# Patient Record
Sex: Female | Born: 1940 | Hispanic: No
Health system: Southern US, Academic
[De-identification: ages and names within clinical notes are randomized; demographics above are authoritative.]

## PROBLEM LIST (undated history)

## (undated) ENCOUNTER — Ambulatory Visit: Payer: MEDICARE

## (undated) ENCOUNTER — Telehealth: Attending: Dermatology | Primary: Dermatology

## (undated) DIAGNOSIS — Z87442 Personal history of urinary calculi: Secondary | ICD-10-CM

## (undated) DIAGNOSIS — I10 Essential (primary) hypertension: Secondary | ICD-10-CM

## (undated) DIAGNOSIS — G459 Transient cerebral ischemic attack, unspecified: Secondary | ICD-10-CM

## (undated) DIAGNOSIS — M329 Systemic lupus erythematosus, unspecified: Secondary | ICD-10-CM

## (undated) DIAGNOSIS — R29898 Other symptoms and signs involving the musculoskeletal system: Secondary | ICD-10-CM

## (undated) DIAGNOSIS — L930 Discoid lupus erythematosus: Secondary | ICD-10-CM

## (undated) DIAGNOSIS — R42 Dizziness and giddiness: Secondary | ICD-10-CM

## (undated) DIAGNOSIS — F319 Bipolar disorder, unspecified: Secondary | ICD-10-CM

## (undated) DIAGNOSIS — F419 Anxiety disorder, unspecified: Secondary | ICD-10-CM

## (undated) DIAGNOSIS — K2970 Gastritis, unspecified, without bleeding: Secondary | ICD-10-CM

## (undated) DIAGNOSIS — G2581 Restless legs syndrome: Secondary | ICD-10-CM

## (undated) DIAGNOSIS — T4145XA Adverse effect of unspecified anesthetic, initial encounter: Secondary | ICD-10-CM

## (undated) DIAGNOSIS — R569 Unspecified convulsions: Secondary | ICD-10-CM

## (undated) DIAGNOSIS — J309 Allergic rhinitis, unspecified: Secondary | ICD-10-CM

## (undated) DIAGNOSIS — T8859XA Other complications of anesthesia, initial encounter: Secondary | ICD-10-CM

## (undated) DIAGNOSIS — J449 Chronic obstructive pulmonary disease, unspecified: Secondary | ICD-10-CM

## (undated) DIAGNOSIS — K5792 Diverticulitis of intestine, part unspecified, without perforation or abscess without bleeding: Secondary | ICD-10-CM

## (undated) DIAGNOSIS — F329 Major depressive disorder, single episode, unspecified: Secondary | ICD-10-CM

## (undated) DIAGNOSIS — N189 Chronic kidney disease, unspecified: Secondary | ICD-10-CM

## (undated) DIAGNOSIS — G8929 Other chronic pain: Secondary | ICD-10-CM

## (undated) DIAGNOSIS — F0390 Unspecified dementia without behavioral disturbance: Secondary | ICD-10-CM

## (undated) DIAGNOSIS — K219 Gastro-esophageal reflux disease without esophagitis: Secondary | ICD-10-CM

## (undated) DIAGNOSIS — H6120 Impacted cerumen, unspecified ear: Secondary | ICD-10-CM

## (undated) DIAGNOSIS — F32A Depression, unspecified: Secondary | ICD-10-CM

## (undated) HISTORY — PX: TUBAL LIGATION: SHX77

## (undated) HISTORY — DX: Diverticulitis of intestine, part unspecified, without perforation or abscess without bleeding: K57.92

## (undated) HISTORY — DX: Anxiety disorder, unspecified: F41.9

## (undated) HISTORY — DX: Systemic lupus erythematosus, unspecified: M32.9

## (undated) HISTORY — DX: Chronic kidney disease, unspecified: N18.9

## (undated) HISTORY — PX: KNEE ARTHROSCOPY W/ MENISCECTOMY: SHX1879

## (undated) HISTORY — PX: ROTATOR CUFF REPAIR: SHX139

## (undated) HISTORY — DX: Discoid lupus erythematosus: L93.0

## (undated) HISTORY — PX: LAPAROSCOPY: SHX197

## (undated) HISTORY — DX: Restless legs syndrome: G25.81

## (undated) HISTORY — DX: Other chronic pain: G89.29

## (undated) HISTORY — DX: Unspecified convulsions: R56.9

## (undated) HISTORY — DX: Gastritis, unspecified, without bleeding: K29.70

## (undated) HISTORY — PX: APPENDECTOMY: SHX54

## (undated) HISTORY — PX: ABDOMINAL HYSTERECTOMY: SHX81

## (undated) HISTORY — PX: LITHOTRIPSY: SUR834

---
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
## 2011-06-02 ENCOUNTER — Inpatient Hospital Stay: Payer: Self-pay | Admitting: Psychiatry

## 2011-07-21 ENCOUNTER — Emergency Department: Payer: Self-pay | Admitting: Emergency Medicine

## 2011-07-21 LAB — COMPREHENSIVE METABOLIC PANEL
Alkaline Phosphatase: 92 U/L (ref 50–136)
BUN: 14 mg/dL (ref 7–18)
Bilirubin,Total: 0.3 mg/dL (ref 0.2–1.0)
Calcium, Total: 10.9 mg/dL — ABNORMAL HIGH (ref 8.5–10.1)
Chloride: 104 mmol/L (ref 98–107)
Creatinine: 0.85 mg/dL (ref 0.60–1.30)
EGFR (African American): >60
EGFR (Non-African Amer.): >60
Potassium: 3.7 mmol/L (ref 3.5–5.1)
SGOT(AST): 30 U/L (ref 15–37)
SGPT (ALT): 33 U/L
Total Protein: 8 g/dL (ref 6.4–8.2)

## 2011-07-21 LAB — URINALYSIS, COMPLETE
Bacteria: NONE SEEN
Blood: NEGATIVE
Nitrite: NEGATIVE
Protein: NEGATIVE
Specific Gravity: 1.01 (ref 1.003–1.030)

## 2011-07-21 LAB — CBC
HCT: 41.4 % (ref 35.0–47.0)
HGB: 13.9 g/dL (ref 12.0–16.0)
MCH: 33.9 pg (ref 26.0–34.0)
MCHC: 33.6 g/dL (ref 32.0–36.0)

## 2011-07-21 LAB — PROTIME-INR
INR: 0.8
Prothrombin Time: 11.4 secs — ABNORMAL LOW (ref 11.5–14.7)

## 2011-08-09 ENCOUNTER — Emergency Department: Payer: Self-pay | Admitting: Emergency Medicine

## 2011-08-09 LAB — CBC
HGB: 13.8 g/dL (ref 12.0–16.0)
MCH: 34.4 pg — ABNORMAL HIGH (ref 26.0–34.0)
MCHC: 34 g/dL (ref 32.0–36.0)
RDW: 15 % — ABNORMAL HIGH (ref 11.5–14.5)
WBC: 5.7 10*3/uL (ref 3.6–11.0)

## 2011-08-09 LAB — COMPREHENSIVE METABOLIC PANEL
Albumin: 3.8 g/dL (ref 3.4–5.0)
Anion Gap: 11 (ref 7–16)
BUN: 9 mg/dL (ref 7–18)
Creatinine: 0.76 mg/dL (ref 0.60–1.30)
Glucose: 102 mg/dL — ABNORMAL HIGH (ref 65–99)
Osmolality: 273 (ref 275–301)
Potassium: 3.4 mmol/L — ABNORMAL LOW (ref 3.5–5.1)
SGOT(AST): 22 U/L (ref 15–37)
Sodium: 137 mmol/L (ref 136–145)
Total Protein: 7.4 g/dL (ref 6.4–8.2)

## 2011-08-09 LAB — URINALYSIS, COMPLETE
Bilirubin,UR: NEGATIVE
Leukocyte Esterase: NEGATIVE
Ph: 5 (ref 4.5–8.0)
Protein: NEGATIVE
RBC,UR: 1 /HPF (ref 0–5)
Specific Gravity: 1.026 (ref 1.003–1.030)
WBC UR: <1 /HPF (ref 0–5)

## 2011-08-09 LAB — CK TOTAL AND CKMB (NOT AT ARMC)
CK, Total: 90 U/L (ref 21–215)
CK-MB: 2.8 ng/mL (ref 0.5–3.6)

## 2011-08-09 LAB — ETHANOL
Ethanol %: 0.061 % (ref 0.000–0.080)
Ethanol: 61 mg/dL

## 2011-08-09 LAB — TROPONIN I: Troponin-I: <0.02 ng/mL

## 2011-08-09 LAB — LIPASE, BLOOD: Lipase: 48 U/L — ABNORMAL LOW (ref 73–393)

## 2011-11-20 DIAGNOSIS — R5383 Other fatigue: Secondary | ICD-10-CM | POA: Insufficient documentation

## 2011-12-24 ENCOUNTER — Emergency Department: Payer: Self-pay | Admitting: Emergency Medicine

## 2011-12-24 LAB — URINALYSIS, COMPLETE
Bilirubin,UR: NEGATIVE
Blood: NEGATIVE
Glucose,UR: NEGATIVE mg/dL (ref 0–75)
Hyaline Cast: 1
Leukocyte Esterase: NEGATIVE
Nitrite: NEGATIVE
Protein: 30
Specific Gravity: 1.015 (ref 1.003–1.030)

## 2011-12-24 LAB — COMPREHENSIVE METABOLIC PANEL
Albumin: 3.8 g/dL (ref 3.4–5.0)
Anion Gap: 9 (ref 7–16)
BUN: 24 mg/dL — ABNORMAL HIGH (ref 7–18)
Calcium, Total: 9.1 mg/dL (ref 8.5–10.1)
Co2: 23 mmol/L (ref 21–32)
EGFR (Non-African Amer.): 41 — ABNORMAL LOW
Glucose: 101 mg/dL — ABNORMAL HIGH (ref 65–99)
Osmolality: 284 (ref 275–301)
Potassium: 3.9 mmol/L (ref 3.5–5.1)
SGOT(AST): 25 U/L (ref 15–37)
SGPT (ALT): 20 U/L
Sodium: 140 mmol/L (ref 136–145)

## 2011-12-24 LAB — CBC
MCH: 32.3 pg (ref 26.0–34.0)
MCHC: 32.3 g/dL (ref 32.0–36.0)
Platelet: 326 10*3/uL (ref 150–440)
RBC: 3.89 10*6/uL (ref 3.80–5.20)
RDW: 13.8 % (ref 11.5–14.5)

## 2011-12-24 LAB — ETHANOL: Ethanol: <3 mg/dL

## 2011-12-25 ENCOUNTER — Emergency Department: Payer: Self-pay | Admitting: Emergency Medicine

## 2011-12-25 LAB — URINALYSIS, COMPLETE
Bilirubin,UR: NEGATIVE
Blood: NEGATIVE
Glucose,UR: NEGATIVE mg/dL (ref 0–75)
Ph: 5 (ref 4.5–8.0)
Protein: 30
Specific Gravity: 1.017 (ref 1.003–1.030)
Squamous Epithelial: 2

## 2011-12-25 LAB — CBC
HGB: 13.8 g/dL (ref 12.0–16.0)
MCV: 101 fL — ABNORMAL HIGH (ref 80–100)
RBC: 4.17 10*6/uL (ref 3.80–5.20)
WBC: 7.4 10*3/uL (ref 3.6–11.0)

## 2011-12-25 LAB — COMPREHENSIVE METABOLIC PANEL
Alkaline Phosphatase: 99 U/L (ref 50–136)
BUN: 19 mg/dL — ABNORMAL HIGH (ref 7–18)
Calcium, Total: 9.8 mg/dL (ref 8.5–10.1)
EGFR (African American): 54 — ABNORMAL LOW
EGFR (Non-African Amer.): 47 — ABNORMAL LOW
Glucose: 115 mg/dL — ABNORMAL HIGH (ref 65–99)
Potassium: 3.8 mmol/L (ref 3.5–5.1)
SGOT(AST): 33 U/L (ref 15–37)
SGPT (ALT): 22 U/L

## 2011-12-28 ENCOUNTER — Emergency Department: Payer: Self-pay | Admitting: Emergency Medicine

## 2011-12-28 LAB — URINALYSIS, COMPLETE
Bilirubin,UR: NEGATIVE
Blood: NEGATIVE
Glucose,UR: NEGATIVE mg/dL (ref 0–75)
Hyaline Cast: 1
Leukocyte Esterase: NEGATIVE
Ph: 6 (ref 4.5–8.0)
Protein: NEGATIVE
RBC,UR: 1 /HPF (ref 0–5)
Specific Gravity: 1.021 (ref 1.003–1.030)
Transitional Epi: <1
WBC UR: <1 /HPF (ref 0–5)

## 2011-12-28 LAB — COMPREHENSIVE METABOLIC PANEL
Anion Gap: 3 — ABNORMAL LOW (ref 7–16)
BUN: 20 mg/dL — ABNORMAL HIGH (ref 7–18)
Calcium, Total: 9.2 mg/dL (ref 8.5–10.1)
Chloride: 107 mmol/L (ref 98–107)
Creatinine: 1.45 mg/dL — ABNORMAL HIGH (ref 0.60–1.30)
EGFR (African American): 42 — ABNORMAL LOW
EGFR (Non-African Amer.): 36 — ABNORMAL LOW
Osmolality: 280 (ref 275–301)
Potassium: 3.8 mmol/L (ref 3.5–5.1)
Sodium: 139 mmol/L (ref 136–145)

## 2011-12-28 LAB — DRUG SCREEN, URINE
Amphetamines, Ur Screen: NEGATIVE (ref ?–1000)
Cocaine Metabolite,Ur ~~LOC~~: NEGATIVE (ref ?–300)
Opiate, Ur Screen: NEGATIVE (ref ?–300)
Phencyclidine (PCP) Ur S: NEGATIVE (ref ?–25)
Tricyclic, Ur Screen: POSITIVE (ref ?–1000)

## 2011-12-28 LAB — CBC
MCH: 33.1 pg (ref 26.0–34.0)
MCV: 100 fL (ref 80–100)
Platelet: 342 10*3/uL (ref 150–440)
RDW: 13.4 % (ref 11.5–14.5)
WBC: 6.2 10*3/uL (ref 3.6–11.0)

## 2011-12-28 LAB — ETHANOL
Ethanol %: <0.003 % (ref 0.000–0.080)
Ethanol: <3 mg/dL

## 2011-12-28 LAB — TSH: Thyroid Stimulating Horm: 0.6 u[IU]/mL

## 2011-12-28 LAB — ACETAMINOPHEN LEVEL: Acetaminophen: <2 ug/mL

## 2011-12-28 LAB — PROTIME-INR: Prothrombin Time: 12.5 secs (ref 11.5–14.7)

## 2011-12-28 LAB — SALICYLATE LEVEL: Salicylates, Serum: 3 mg/dL — ABNORMAL HIGH

## 2012-05-16 ENCOUNTER — Ambulatory Visit: Payer: Self-pay | Admitting: Nurse Practitioner

## 2012-05-16 DIAGNOSIS — S4991XA Unspecified injury of right shoulder and upper arm, initial encounter: Secondary | ICD-10-CM | POA: Insufficient documentation

## 2012-05-19 DIAGNOSIS — I10 Essential (primary) hypertension: Secondary | ICD-10-CM | POA: Insufficient documentation

## 2012-07-02 ENCOUNTER — Emergency Department: Payer: Self-pay

## 2012-07-02 LAB — URINALYSIS, COMPLETE
Bacteria: NONE SEEN
Glucose,UR: NEGATIVE mg/dL (ref 0–75)
Hyaline Cast: 4
Ketone: NEGATIVE
Leukocyte Esterase: NEGATIVE
Ph: 5 (ref 4.5–8.0)
Protein: NEGATIVE
RBC,UR: 1 /HPF (ref 0–5)
Specific Gravity: 1.017 (ref 1.003–1.030)
Squamous Epithelial: 2

## 2012-07-02 LAB — COMPREHENSIVE METABOLIC PANEL
Albumin: 3.8 g/dL (ref 3.4–5.0)
Anion Gap: 10 (ref 7–16)
Bilirubin,Total: 0.2 mg/dL (ref 0.2–1.0)
Chloride: 105 mmol/L (ref 98–107)
EGFR (African American): >60
Glucose: 85 mg/dL (ref 65–99)
Potassium: 4.1 mmol/L (ref 3.5–5.1)
Sodium: 140 mmol/L (ref 136–145)
Total Protein: 7.3 g/dL (ref 6.4–8.2)

## 2012-07-02 LAB — CBC
HGB: 14.2 g/dL (ref 12.0–16.0)
MCH: 34.3 pg — ABNORMAL HIGH (ref 26.0–34.0)
MCV: 101 fL — ABNORMAL HIGH (ref 80–100)
Platelet: 320 10*3/uL (ref 150–440)
RDW: 14.3 % (ref 11.5–14.5)

## 2012-07-03 ENCOUNTER — Ambulatory Visit: Payer: Self-pay | Admitting: General Practice

## 2012-07-15 ENCOUNTER — Ambulatory Visit: Payer: Self-pay | Admitting: Pain Medicine

## 2012-07-15 LAB — SEDIMENTATION RATE: Erythrocyte Sed Rate: 6 mm/hr (ref 0–30)

## 2012-07-16 ENCOUNTER — Ambulatory Visit: Payer: Self-pay | Admitting: Pain Medicine

## 2012-07-17 ENCOUNTER — Emergency Department: Payer: Self-pay | Admitting: Emergency Medicine

## 2012-07-17 LAB — COMPREHENSIVE METABOLIC PANEL
Albumin: 3.6 g/dL (ref 3.4–5.0)
Alkaline Phosphatase: 104 U/L (ref 50–136)
Anion Gap: 9 (ref 7–16)
BUN: 21 mg/dL — ABNORMAL HIGH (ref 7–18)
Bilirubin,Total: 0.2 mg/dL (ref 0.2–1.0)
Calcium, Total: 9.3 mg/dL (ref 8.5–10.1)
Chloride: 108 mmol/L — ABNORMAL HIGH (ref 98–107)
Co2: 24 mmol/L (ref 21–32)
EGFR (Non-African Amer.): >60
Potassium: 4.8 mmol/L (ref 3.5–5.1)
SGOT(AST): 22 U/L (ref 15–37)
SGPT (ALT): 19 U/L (ref 12–78)
Total Protein: 7.3 g/dL (ref 6.4–8.2)

## 2012-07-17 LAB — PRO B NATRIURETIC PEPTIDE: B-Type Natriuretic Peptide: 242 pg/mL — ABNORMAL HIGH (ref 0–125)

## 2012-07-17 LAB — CBC
MCH: 33.8 pg (ref 26.0–34.0)
MCV: 102 fL — ABNORMAL HIGH (ref 80–100)
RBC: 3.96 10*6/uL (ref 3.80–5.20)
RDW: 14.4 % (ref 11.5–14.5)
WBC: 8.6 10*3/uL (ref 3.6–11.0)

## 2012-07-17 LAB — CK TOTAL AND CKMB (NOT AT ARMC): CK, Total: 51 U/L (ref 21–215)

## 2012-07-18 ENCOUNTER — Ambulatory Visit: Payer: Self-pay | Admitting: Pain Medicine

## 2012-07-31 ENCOUNTER — Ambulatory Visit: Payer: Self-pay | Admitting: Pain Medicine

## 2012-08-04 ENCOUNTER — Emergency Department: Payer: Self-pay | Admitting: Emergency Medicine

## 2012-08-04 LAB — CBC
HCT: 40.1 % (ref 35.0–47.0)
HGB: 13.5 g/dL (ref 12.0–16.0)
MCH: 35.1 pg — ABNORMAL HIGH (ref 26.0–34.0)
MCHC: 33.6 g/dL (ref 32.0–36.0)
Platelet: 366 10*3/uL (ref 150–440)
RBC: 3.85 10*6/uL (ref 3.80–5.20)
WBC: 7 10*3/uL (ref 3.6–11.0)

## 2012-08-04 LAB — BASIC METABOLIC PANEL
Co2: 27 mmol/L (ref 21–32)
Creatinine: 0.74 mg/dL (ref 0.60–1.30)
EGFR (African American): >60
EGFR (Non-African Amer.): >60
Glucose: 106 mg/dL — ABNORMAL HIGH (ref 65–99)

## 2012-08-04 LAB — URINALYSIS, COMPLETE
Bacteria: NONE SEEN
Blood: NEGATIVE
Glucose,UR: NEGATIVE mg/dL (ref 0–75)
Ketone: NEGATIVE
Leukocyte Esterase: NEGATIVE
Nitrite: NEGATIVE
Protein: NEGATIVE
RBC,UR: 2 /HPF (ref 0–5)
Squamous Epithelial: NONE SEEN
WBC UR: 1 /HPF (ref 0–5)

## 2012-08-04 LAB — TROPONIN I: Troponin-I: <0.02 ng/mL

## 2012-08-14 ENCOUNTER — Ambulatory Visit: Payer: Self-pay | Admitting: Pain Medicine

## 2012-09-01 ENCOUNTER — Emergency Department: Payer: Self-pay | Admitting: Emergency Medicine

## 2012-09-19 ENCOUNTER — Ambulatory Visit: Payer: Self-pay | Admitting: Neurological Surgery

## 2012-10-03 ENCOUNTER — Inpatient Hospital Stay: Payer: Self-pay | Admitting: Internal Medicine

## 2012-10-03 LAB — TSH: Thyroid Stimulating Horm: 1.75 u[IU]/mL

## 2012-10-03 LAB — COMPREHENSIVE METABOLIC PANEL
Bilirubin,Total: 0.3 mg/dL (ref 0.2–1.0)
Calcium, Total: 9.1 mg/dL (ref 8.5–10.1)
Chloride: 110 mmol/L — ABNORMAL HIGH (ref 98–107)
Co2: 30 mmol/L (ref 21–32)
Creatinine: 0.63 mg/dL (ref 0.60–1.30)
EGFR (African American): >60
EGFR (Non-African Amer.): >60
Glucose: 124 mg/dL — ABNORMAL HIGH (ref 65–99)
Osmolality: 291 (ref 275–301)
Potassium: 4 mmol/L (ref 3.5–5.1)
SGOT(AST): 29 U/L (ref 15–37)
SGPT (ALT): 24 U/L (ref 12–78)
Sodium: 144 mmol/L (ref 136–145)
Total Protein: 7.4 g/dL (ref 6.4–8.2)

## 2012-10-03 LAB — DRUG SCREEN, URINE
Amphetamines, Ur Screen: NEGATIVE (ref ?–1000)
Cannabinoid 50 Ng, Ur ~~LOC~~: NEGATIVE (ref ?–50)
Cocaine Metabolite,Ur ~~LOC~~: NEGATIVE (ref ?–300)
MDMA (Ecstasy)Ur Screen: NEGATIVE (ref ?–500)
Methadone, Ur Screen: NEGATIVE (ref ?–300)
Opiate, Ur Screen: NEGATIVE (ref ?–300)
Phencyclidine (PCP) Ur S: NEGATIVE (ref ?–25)
Tricyclic, Ur Screen: NEGATIVE (ref ?–1000)

## 2012-10-03 LAB — CBC
HCT: 41.1 % (ref 35.0–47.0)
HGB: 13.9 g/dL (ref 12.0–16.0)
MCH: 35.6 pg — ABNORMAL HIGH (ref 26.0–34.0)
MCV: 105 fL — ABNORMAL HIGH (ref 80–100)
WBC: 12.5 10*3/uL — ABNORMAL HIGH (ref 3.6–11.0)

## 2012-10-03 LAB — URINALYSIS, COMPLETE
Bacteria: NONE SEEN
Bilirubin,UR: NEGATIVE
Blood: NEGATIVE
Ketone: NEGATIVE
Nitrite: NEGATIVE
Specific Gravity: 1.014 (ref 1.003–1.030)

## 2012-10-03 LAB — CK TOTAL AND CKMB (NOT AT ARMC): CK, Total: 107 U/L (ref 21–215)

## 2012-10-03 LAB — TROPONIN I: Troponin-I: <0.02 ng/mL

## 2012-10-03 LAB — ETHANOL
Ethanol %: <0.003 % (ref 0.000–0.080)
Ethanol: <3 mg/dL

## 2012-10-03 LAB — AMMONIA: Ammonia, Plasma: 35 mcmol/L — ABNORMAL HIGH (ref 11–32)

## 2012-10-04 LAB — CBC WITH DIFFERENTIAL/PLATELET
Basophil #: 0.1 10*3/uL (ref 0.0–0.1)
Basophil %: 1.1 %
Eosinophil #: 0.3 10*3/uL (ref 0.0–0.7)
Eosinophil %: 2.2 %
HCT: 40.1 % (ref 35.0–47.0)
HGB: 13.4 g/dL (ref 12.0–16.0)
Lymphocyte #: 2.2 10*3/uL (ref 1.0–3.6)
Lymphocyte %: 19.1 %
MCHC: 33.3 g/dL (ref 32.0–36.0)
MCV: 106 fL — ABNORMAL HIGH (ref 80–100)
Monocyte %: 6.2 %
Neutrophil %: 71.4 %
RDW: 13.6 % (ref 11.5–14.5)
WBC: 11.7 10*3/uL — ABNORMAL HIGH (ref 3.6–11.0)

## 2012-10-04 LAB — BASIC METABOLIC PANEL
Anion Gap: 3 — ABNORMAL LOW (ref 7–16)
BUN: 22 mg/dL — ABNORMAL HIGH (ref 7–18)
Co2: 29 mmol/L (ref 21–32)
Creatinine: 0.58 mg/dL — ABNORMAL LOW (ref 0.60–1.30)
EGFR (African American): >60
EGFR (Non-African Amer.): >60
Glucose: 80 mg/dL (ref 65–99)
Osmolality: 291 (ref 275–301)
Sodium: 145 mmol/L (ref 136–145)

## 2012-10-08 LAB — CREATININE, SERUM: EGFR (African American): >60

## 2012-10-12 LAB — BASIC METABOLIC PANEL
Anion Gap: 5 — ABNORMAL LOW (ref 7–16)
BUN: 31 mg/dL — ABNORMAL HIGH (ref 7–18)
Calcium, Total: 9.3 mg/dL (ref 8.5–10.1)
Co2: 27 mmol/L (ref 21–32)
Creatinine: 0.93 mg/dL (ref 0.60–1.30)
EGFR (African American): >60
EGFR (Non-African Amer.): >60
Glucose: 101 mg/dL — ABNORMAL HIGH (ref 65–99)
Osmolality: 290 (ref 275–301)

## 2012-12-19 ENCOUNTER — Encounter: Payer: Self-pay | Admitting: Neurology

## 2013-02-23 ENCOUNTER — Ambulatory Visit: Payer: Self-pay | Admitting: Internal Medicine

## 2013-03-11 ENCOUNTER — Inpatient Hospital Stay: Payer: Self-pay | Admitting: Internal Medicine

## 2013-03-11 LAB — COMPREHENSIVE METABOLIC PANEL
Alkaline Phosphatase: 145 U/L — ABNORMAL HIGH (ref 50–136)
Calcium, Total: 9.1 mg/dL (ref 8.5–10.1)
Chloride: 102 mmol/L (ref 98–107)
EGFR (African American): >60
EGFR (Non-African Amer.): >60
Glucose: 77 mg/dL (ref 65–99)
Osmolality: 268 (ref 275–301)
SGPT (ALT): 25 U/L (ref 12–78)
Sodium: 134 mmol/L — ABNORMAL LOW (ref 136–145)

## 2013-03-11 LAB — CBC WITH DIFFERENTIAL/PLATELET
Bands: 2 %
Comment - H1-Com1: NORMAL
HCT: 38.7 % (ref 35.0–47.0)
MCH: 32.7 pg (ref 26.0–34.0)
MCV: 99 fL (ref 80–100)
Monocytes: 4 %
RBC: 3.91 10*6/uL (ref 3.80–5.20)
WBC: 21.5 10*3/uL — ABNORMAL HIGH (ref 3.6–11.0)

## 2013-03-11 LAB — ETHANOL: Ethanol: 56 mg/dL

## 2013-03-12 LAB — CBC WITH DIFFERENTIAL/PLATELET
Basophil #: 0 10*3/uL (ref 0.0–0.1)
Eosinophil %: 0 %
HGB: 11.3 g/dL — ABNORMAL LOW (ref 12.0–16.0)
Lymphocyte #: 0.9 10*3/uL — ABNORMAL LOW (ref 1.0–3.6)
Lymphocyte %: 4.8 %
MCH: 33.3 pg (ref 26.0–34.0)
MCV: 99 fL (ref 80–100)
Monocyte #: 0.7 x10 3/mm (ref 0.2–0.9)
Monocyte %: 4 %
Neutrophil %: 91 %
RBC: 3.41 10*6/uL — ABNORMAL LOW (ref 3.80–5.20)
RDW: 13.8 % (ref 11.5–14.5)

## 2013-03-12 LAB — URINALYSIS, COMPLETE
Bacteria: NONE SEEN
Blood: NEGATIVE
Leukocyte Esterase: NEGATIVE
Nitrite: NEGATIVE
Ph: 5 (ref 4.5–8.0)
RBC,UR: 4 /HPF (ref 0–5)
Specific Gravity: 1.03 (ref 1.003–1.030)
Squamous Epithelial: 8
WBC UR: 3 /HPF (ref 0–5)

## 2013-03-12 LAB — BASIC METABOLIC PANEL
Anion Gap: 5 — ABNORMAL LOW (ref 7–16)
BUN: 19 mg/dL — ABNORMAL HIGH (ref 7–18)
Calcium, Total: 9 mg/dL (ref 8.5–10.1)
Chloride: 103 mmol/L (ref 98–107)
Creatinine: 0.77 mg/dL (ref 0.60–1.30)
EGFR (African American): >60
EGFR (Non-African Amer.): >60
Potassium: 4.3 mmol/L (ref 3.5–5.1)
Sodium: 137 mmol/L (ref 136–145)

## 2013-03-12 LAB — CK TOTAL AND CKMB (NOT AT ARMC)
CK, Total: 68 U/L (ref 21–215)
CK-MB: 2.8 ng/mL (ref 0.5–3.6)

## 2013-03-12 LAB — TROPONIN I: Troponin-I: <0.02 ng/mL

## 2013-03-12 LAB — MAGNESIUM: Magnesium: 2.3 mg/dL

## 2013-03-13 LAB — CBC WITH DIFFERENTIAL/PLATELET
Basophil #: 0 10*3/uL (ref 0.0–0.1)
Eosinophil #: 0.1 10*3/uL (ref 0.0–0.7)
HGB: 11 g/dL — ABNORMAL LOW (ref 12.0–16.0)
Lymphocyte #: 1 10*3/uL (ref 1.0–3.6)
Lymphocyte %: 6.7 %
MCV: 101 fL — ABNORMAL HIGH (ref 80–100)
Neutrophil %: 88.6 %
RBC: 3.29 10*6/uL — ABNORMAL LOW (ref 3.80–5.20)
RDW: 13.6 % (ref 11.5–14.5)

## 2013-03-13 LAB — BASIC METABOLIC PANEL
Calcium, Total: 9.5 mg/dL (ref 8.5–10.1)
Creatinine: 0.83 mg/dL (ref 0.60–1.30)
EGFR (Non-African Amer.): >60
Glucose: 69 mg/dL (ref 65–99)
Osmolality: 277 (ref 275–301)
Potassium: 4 mmol/L (ref 3.5–5.1)
Sodium: 136 mmol/L (ref 136–145)

## 2013-03-15 LAB — CBC WITH DIFFERENTIAL/PLATELET
Basophil #: 0 10*3/uL (ref 0.0–0.1)
Basophil %: 0.3 %
Eosinophil #: 0.1 10*3/uL (ref 0.0–0.7)
Eosinophil %: 1.4 %
HCT: 30.6 % — ABNORMAL LOW (ref 35.0–47.0)
Lymphocyte #: 1.1 10*3/uL (ref 1.0–3.6)
MCH: 33.6 pg (ref 26.0–34.0)
Monocyte #: 0.9 x10 3/mm (ref 0.2–0.9)
Neutrophil #: 8.3 10*3/uL — ABNORMAL HIGH (ref 1.4–6.5)
Platelet: 340 10*3/uL (ref 150–440)
RDW: 13 % (ref 11.5–14.5)

## 2013-03-17 LAB — CBC WITH DIFFERENTIAL/PLATELET
Basophil #: 0 10*3/uL (ref 0.0–0.1)
Eosinophil #: 0 10*3/uL (ref 0.0–0.7)
HGB: 10.5 g/dL — ABNORMAL LOW (ref 12.0–16.0)
Lymphocyte #: 0.7 10*3/uL — ABNORMAL LOW (ref 1.0–3.6)
Lymphocyte %: 10.7 %
MCH: 34.1 pg — ABNORMAL HIGH (ref 26.0–34.0)
Monocyte #: 0.2 x10 3/mm (ref 0.2–0.9)
Monocyte %: 3.6 %
Neutrophil #: 5.6 10*3/uL (ref 1.4–6.5)
Neutrophil %: 85.4 %
RBC: 3.07 10*6/uL — ABNORMAL LOW (ref 3.80–5.20)
RDW: 13.2 % (ref 11.5–14.5)

## 2013-03-17 LAB — BASIC METABOLIC PANEL
Anion Gap: 4 — ABNORMAL LOW (ref 7–16)
BUN: 19 mg/dL — ABNORMAL HIGH (ref 7–18)
Calcium, Total: 9.4 mg/dL (ref 8.5–10.1)
Chloride: 103 mmol/L (ref 98–107)
Co2: 28 mmol/L (ref 21–32)
Creatinine: 0.74 mg/dL (ref 0.60–1.30)
EGFR (African American): >60
Glucose: 201 mg/dL — ABNORMAL HIGH (ref 65–99)
Osmolality: 278 (ref 275–301)
Potassium: 4.2 mmol/L (ref 3.5–5.1)
Sodium: 135 mmol/L — ABNORMAL LOW (ref 136–145)

## 2013-03-18 LAB — CBC WITH DIFFERENTIAL/PLATELET
Basophil #: 0 10*3/uL (ref 0.0–0.1)
Eosinophil %: 0 %
Lymphocyte %: 6 %
MCH: 33.2 pg (ref 26.0–34.0)
MCHC: 33.8 g/dL (ref 32.0–36.0)
MCV: 98 fL (ref 80–100)
Monocyte #: 0.8 x10 3/mm (ref 0.2–0.9)
Monocyte %: 6.9 %
Neutrophil #: 10.3 10*3/uL — ABNORMAL HIGH (ref 1.4–6.5)
Neutrophil %: 86.9 %

## 2013-03-18 LAB — MAGNESIUM: Magnesium: 2.3 mg/dL

## 2013-03-25 ENCOUNTER — Emergency Department: Payer: Self-pay | Admitting: Emergency Medicine

## 2013-03-25 ENCOUNTER — Ambulatory Visit: Payer: Self-pay | Admitting: Internal Medicine

## 2013-03-25 DIAGNOSIS — I498 Other specified cardiac arrhythmias: Secondary | ICD-10-CM | POA: Diagnosis not present

## 2013-03-25 LAB — COMPREHENSIVE METABOLIC PANEL
Albumin: 2.9 g/dL — ABNORMAL LOW (ref 3.4–5.0)
Alkaline Phosphatase: 113 U/L (ref 50–136)
Anion Gap: 4 — ABNORMAL LOW (ref 7–16)
Bilirubin,Total: 0.2 mg/dL (ref 0.2–1.0)
Chloride: 107 mmol/L (ref 98–107)
Co2: 28 mmol/L (ref 21–32)
Creatinine: 0.68 mg/dL (ref 0.60–1.30)
EGFR (African American): >60
EGFR (Non-African Amer.): >60
Glucose: 114 mg/dL — ABNORMAL HIGH (ref 65–99)
Osmolality: 279 (ref 275–301)
Potassium: 4.3 mmol/L (ref 3.5–5.1)
SGOT(AST): 17 U/L (ref 15–37)
SGPT (ALT): 26 U/L (ref 12–78)
Total Protein: 7.3 g/dL (ref 6.4–8.2)

## 2013-03-25 LAB — CBC WITH DIFFERENTIAL/PLATELET
Basophil %: 1.1 %
HCT: 36.8 % (ref 35.0–47.0)
HGB: 12.5 g/dL (ref 12.0–16.0)
Lymphocyte #: 1.4 10*3/uL (ref 1.0–3.6)
Lymphocyte %: 15.9 %
MCH: 33.6 pg (ref 26.0–34.0)
MCHC: 33.9 g/dL (ref 32.0–36.0)
MCV: 99 fL (ref 80–100)
Monocyte #: 0.6 x10 3/mm (ref 0.2–0.9)
Monocyte %: 7.4 %

## 2013-03-25 LAB — TROPONIN I: Troponin-I: <0.02 ng/mL

## 2013-03-25 LAB — LIPASE, BLOOD: Lipase: 141 U/L (ref 73–393)

## 2013-03-25 LAB — PRO B NATRIURETIC PEPTIDE: B-Type Natriuretic Peptide: 658 pg/mL — ABNORMAL HIGH (ref 0–125)

## 2013-03-26 ENCOUNTER — Emergency Department: Payer: Self-pay | Admitting: Emergency Medicine

## 2013-04-03 ENCOUNTER — Emergency Department: Payer: Self-pay | Admitting: Emergency Medicine

## 2013-04-03 LAB — CBC
HGB: 10.9 g/dL — ABNORMAL LOW (ref 12.0–16.0)
MCV: 98 fL (ref 80–100)
Platelet: 424 10*3/uL (ref 150–440)
RDW: 13.9 % (ref 11.5–14.5)
WBC: 7.3 10*3/uL (ref 3.6–11.0)

## 2013-04-03 LAB — COMPREHENSIVE METABOLIC PANEL
Albumin: 2.7 g/dL — ABNORMAL LOW (ref 3.4–5.0)
Alkaline Phosphatase: 83 U/L (ref 50–136)
Anion Gap: 2 — ABNORMAL LOW (ref 7–16)
BUN: 19 mg/dL — ABNORMAL HIGH (ref 7–18)
Bilirubin,Total: 0.2 mg/dL (ref 0.2–1.0)
Calcium, Total: 9.6 mg/dL (ref 8.5–10.1)
Creatinine: 0.8 mg/dL (ref 0.60–1.30)
EGFR (Non-African Amer.): >60
Glucose: 84 mg/dL (ref 65–99)
Potassium: 3.6 mmol/L (ref 3.5–5.1)
SGPT (ALT): 20 U/L (ref 12–78)
Sodium: 139 mmol/L (ref 136–145)

## 2013-04-07 ENCOUNTER — Emergency Department: Payer: Self-pay | Admitting: Emergency Medicine

## 2013-04-07 LAB — CBC
HCT: 35.9 % (ref 35.0–47.0)
MCH: 32.8 pg (ref 26.0–34.0)
MCHC: 33.4 g/dL (ref 32.0–36.0)
MCV: 98 fL (ref 80–100)
RDW: 14.5 % (ref 11.5–14.5)

## 2013-04-07 LAB — URINALYSIS, COMPLETE
Bilirubin,UR: NEGATIVE
Blood: NEGATIVE
Glucose,UR: NEGATIVE mg/dL (ref 0–75)
Ketone: NEGATIVE
Leukocyte Esterase: NEGATIVE
Nitrite: NEGATIVE
Ph: 6 (ref 4.5–8.0)
Protein: 30
RBC,UR: 2 /HPF (ref 0–5)
Squamous Epithelial: 2
WBC UR: <1 /HPF (ref 0–5)

## 2013-04-07 LAB — COMPREHENSIVE METABOLIC PANEL
Alkaline Phosphatase: 106 U/L (ref 50–136)
BUN: 7 mg/dL (ref 7–18)
Bilirubin,Total: 0.1 mg/dL — ABNORMAL LOW (ref 0.2–1.0)
Calcium, Total: 9.4 mg/dL (ref 8.5–10.1)
Creatinine: 0.65 mg/dL (ref 0.60–1.30)
EGFR (African American): >60
Osmolality: 281 (ref 275–301)
Potassium: 3.8 mmol/L (ref 3.5–5.1)
SGOT(AST): 20 U/L (ref 15–37)
SGPT (ALT): 23 U/L (ref 12–78)

## 2013-04-07 LAB — ETHANOL
Ethanol %: 0.052 % (ref 0.000–0.080)
Ethanol: 52 mg/dL

## 2013-04-07 LAB — LIPASE, BLOOD: Lipase: 83 U/L (ref 73–393)

## 2013-04-15 ENCOUNTER — Ambulatory Visit: Payer: Self-pay | Admitting: Internal Medicine

## 2013-04-21 ENCOUNTER — Ambulatory Visit: Payer: Self-pay | Admitting: Gastroenterology

## 2013-04-22 LAB — PATHOLOGY REPORT

## 2013-04-29 ENCOUNTER — Emergency Department: Payer: Self-pay | Admitting: Emergency Medicine

## 2013-04-29 LAB — URINALYSIS, COMPLETE
Bacteria: NONE SEEN
Blood: NEGATIVE
Glucose,UR: NEGATIVE mg/dL (ref 0–75)
Ketone: NEGATIVE
Nitrite: NEGATIVE
Ph: 6 (ref 4.5–8.0)
Protein: 30
RBC,UR: 1 /HPF (ref 0–5)
WBC UR: 1 /HPF (ref 0–5)

## 2013-04-29 LAB — COMPREHENSIVE METABOLIC PANEL
Anion Gap: 4 — ABNORMAL LOW (ref 7–16)
BUN: 8 mg/dL (ref 7–18)
Bilirubin,Total: 0.1 mg/dL — ABNORMAL LOW (ref 0.2–1.0)
Calcium, Total: 9.4 mg/dL (ref 8.5–10.1)
Creatinine: 0.61 mg/dL (ref 0.60–1.30)
EGFR (Non-African Amer.): >60
Osmolality: 281 (ref 275–301)
SGPT (ALT): 26 U/L (ref 12–78)
Total Protein: 7.3 g/dL (ref 6.4–8.2)

## 2013-04-29 LAB — ETHANOL: Ethanol %: 0.07 % (ref 0.000–0.080)

## 2013-04-29 LAB — CBC
HCT: 36.9 % (ref 35.0–47.0)
HGB: 12.6 g/dL (ref 12.0–16.0)
MCH: 34 pg (ref 26.0–34.0)
MCV: 99 fL (ref 80–100)
Platelet: 443 10*3/uL — ABNORMAL HIGH (ref 150–440)
RBC: 3.72 10*6/uL — ABNORMAL LOW (ref 3.80–5.20)
RDW: 15.6 % — ABNORMAL HIGH (ref 11.5–14.5)

## 2013-04-29 LAB — TROPONIN I: Troponin-I: <0.02 ng/mL

## 2013-04-29 LAB — CK TOTAL AND CKMB (NOT AT ARMC): CK, Total: 32 U/L (ref 21–215)

## 2013-06-30 DIAGNOSIS — R110 Nausea: Secondary | ICD-10-CM | POA: Diagnosis not present

## 2013-06-30 DIAGNOSIS — R52 Pain, unspecified: Secondary | ICD-10-CM | POA: Diagnosis not present

## 2013-06-30 DIAGNOSIS — R51 Headache: Secondary | ICD-10-CM | POA: Diagnosis not present

## 2013-06-30 DIAGNOSIS — Z79899 Other long term (current) drug therapy: Secondary | ICD-10-CM | POA: Diagnosis not present

## 2013-06-30 DIAGNOSIS — G894 Chronic pain syndrome: Secondary | ICD-10-CM | POA: Diagnosis not present

## 2013-07-07 DIAGNOSIS — M549 Dorsalgia, unspecified: Secondary | ICD-10-CM | POA: Diagnosis not present

## 2013-07-07 DIAGNOSIS — E538 Deficiency of other specified B group vitamins: Secondary | ICD-10-CM | POA: Diagnosis not present

## 2013-07-07 DIAGNOSIS — F329 Major depressive disorder, single episode, unspecified: Secondary | ICD-10-CM | POA: Diagnosis not present

## 2013-07-07 DIAGNOSIS — F3289 Other specified depressive episodes: Secondary | ICD-10-CM | POA: Diagnosis not present

## 2013-07-17 ENCOUNTER — Emergency Department: Payer: Self-pay | Admitting: Emergency Medicine

## 2013-07-17 DIAGNOSIS — G8929 Other chronic pain: Secondary | ICD-10-CM | POA: Diagnosis not present

## 2013-07-17 DIAGNOSIS — R110 Nausea: Secondary | ICD-10-CM | POA: Diagnosis not present

## 2013-07-17 DIAGNOSIS — Z79899 Other long term (current) drug therapy: Secondary | ICD-10-CM | POA: Diagnosis not present

## 2013-07-17 DIAGNOSIS — Z87442 Personal history of urinary calculi: Secondary | ICD-10-CM | POA: Diagnosis not present

## 2013-07-17 DIAGNOSIS — R52 Pain, unspecified: Secondary | ICD-10-CM | POA: Diagnosis not present

## 2013-07-17 DIAGNOSIS — M329 Systemic lupus erythematosus, unspecified: Secondary | ICD-10-CM | POA: Diagnosis not present

## 2013-07-17 LAB — URINALYSIS, COMPLETE
BLOOD: NEGATIVE
Bilirubin,UR: NEGATIVE
Glucose,UR: NEGATIVE mg/dL (ref 0–75)
Ketone: NEGATIVE
Leukocyte Esterase: NEGATIVE
NITRITE: NEGATIVE
Ph: 5 (ref 4.5–8.0)
RBC,UR: 1 /HPF (ref 0–5)
Specific Gravity: 1.021 (ref 1.003–1.030)
Squamous Epithelial: 2
WBC UR: 1 /HPF (ref 0–5)

## 2013-07-17 LAB — CBC WITH DIFFERENTIAL/PLATELET
Basophil #: 0 10*3/uL (ref 0.0–0.1)
Basophil %: 0.7 %
Eosinophil #: 0.1 10*3/uL (ref 0.0–0.7)
Eosinophil %: 1.4 %
HCT: 41.9 % (ref 35.0–47.0)
HGB: 13.6 g/dL (ref 12.0–16.0)
LYMPHS ABS: 1.6 10*3/uL (ref 1.0–3.6)
Lymphocyte %: 30.7 %
MCH: 33.1 pg (ref 26.0–34.0)
MCHC: 32.5 g/dL (ref 32.0–36.0)
MCV: 102 fL — ABNORMAL HIGH (ref 80–100)
MONO ABS: 0.5 x10 3/mm (ref 0.2–0.9)
Monocyte %: 10.4 %
NEUTROS ABS: 2.9 10*3/uL (ref 1.4–6.5)
Neutrophil %: 56.8 %
PLATELETS: 410 10*3/uL (ref 150–440)
RBC: 4.1 10*6/uL (ref 3.80–5.20)
RDW: 14.2 % (ref 11.5–14.5)
WBC: 5.1 10*3/uL (ref 3.6–11.0)

## 2013-07-17 LAB — COMPREHENSIVE METABOLIC PANEL
ALT: 38 U/L (ref 12–78)
ANION GAP: 8 (ref 7–16)
AST: 67 U/L — AB (ref 15–37)
Albumin: 2.9 g/dL — ABNORMAL LOW (ref 3.4–5.0)
Alkaline Phosphatase: 136 U/L — ABNORMAL HIGH
BUN: 14 mg/dL (ref 7–18)
Bilirubin,Total: 0.2 mg/dL (ref 0.2–1.0)
CO2: 27 mmol/L (ref 21–32)
Calcium, Total: 9.2 mg/dL (ref 8.5–10.1)
Chloride: 101 mmol/L (ref 98–107)
Creatinine: 0.85 mg/dL (ref 0.60–1.30)
EGFR (Non-African Amer.): >60
Glucose: 85 mg/dL (ref 65–99)
OSMOLALITY: 272 (ref 275–301)
Potassium: 4.4 mmol/L (ref 3.5–5.1)
SODIUM: 136 mmol/L (ref 136–145)
TOTAL PROTEIN: 6.6 g/dL (ref 6.4–8.2)

## 2013-07-17 LAB — ETHANOL
ETHANOL %: 0.021 % (ref 0.000–0.080)
ETHANOL LVL: 21 mg/dL

## 2013-07-17 LAB — DRUG SCREEN, URINE
AMPHETAMINES, UR SCREEN: NEGATIVE (ref ?–1000)
Barbiturates, Ur Screen: NEGATIVE (ref ?–200)
Benzodiazepine, Ur Scrn: NEGATIVE (ref ?–200)
COCAINE METABOLITE, UR ~~LOC~~: NEGATIVE (ref ?–300)
Cannabinoid 50 Ng, Ur ~~LOC~~: NEGATIVE (ref ?–50)
MDMA (ECSTASY) UR SCREEN: NEGATIVE (ref ?–500)
Methadone, Ur Screen: NEGATIVE (ref ?–300)
Opiate, Ur Screen: POSITIVE (ref ?–300)
Phencyclidine (PCP) Ur S: POSITIVE (ref ?–25)
TRICYCLIC, UR SCREEN: POSITIVE (ref ?–1000)

## 2013-07-17 LAB — LIPASE, BLOOD: LIPASE: 39 U/L — AB (ref 73–393)

## 2013-08-06 DIAGNOSIS — R609 Edema, unspecified: Secondary | ICD-10-CM | POA: Diagnosis not present

## 2013-08-06 DIAGNOSIS — M47817 Spondylosis without myelopathy or radiculopathy, lumbosacral region: Secondary | ICD-10-CM | POA: Diagnosis not present

## 2013-09-10 ENCOUNTER — Emergency Department: Payer: Self-pay | Admitting: Internal Medicine

## 2013-09-10 DIAGNOSIS — S40019A Contusion of unspecified shoulder, initial encounter: Secondary | ICD-10-CM | POA: Diagnosis not present

## 2013-09-10 DIAGNOSIS — Z79899 Other long term (current) drug therapy: Secondary | ICD-10-CM | POA: Diagnosis not present

## 2013-09-10 DIAGNOSIS — S4980XA Other specified injuries of shoulder and upper arm, unspecified arm, initial encounter: Secondary | ICD-10-CM | POA: Diagnosis not present

## 2013-09-10 DIAGNOSIS — Z87442 Personal history of urinary calculi: Secondary | ICD-10-CM | POA: Diagnosis not present

## 2013-09-10 DIAGNOSIS — M25519 Pain in unspecified shoulder: Secondary | ICD-10-CM | POA: Diagnosis not present

## 2013-09-10 DIAGNOSIS — F411 Generalized anxiety disorder: Secondary | ICD-10-CM | POA: Diagnosis not present

## 2013-09-10 DIAGNOSIS — S46909A Unspecified injury of unspecified muscle, fascia and tendon at shoulder and upper arm level, unspecified arm, initial encounter: Secondary | ICD-10-CM | POA: Diagnosis not present

## 2013-09-10 DIAGNOSIS — G8929 Other chronic pain: Secondary | ICD-10-CM | POA: Diagnosis not present

## 2013-09-10 DIAGNOSIS — R569 Unspecified convulsions: Secondary | ICD-10-CM | POA: Diagnosis not present

## 2013-09-10 DIAGNOSIS — M329 Systemic lupus erythematosus, unspecified: Secondary | ICD-10-CM | POA: Diagnosis not present

## 2013-09-10 DIAGNOSIS — F17200 Nicotine dependence, unspecified, uncomplicated: Secondary | ICD-10-CM | POA: Diagnosis not present

## 2013-09-23 DIAGNOSIS — IMO0001 Reserved for inherently not codable concepts without codable children: Secondary | ICD-10-CM | POA: Diagnosis not present

## 2013-09-23 DIAGNOSIS — F3289 Other specified depressive episodes: Secondary | ICD-10-CM | POA: Diagnosis not present

## 2013-09-23 DIAGNOSIS — K219 Gastro-esophageal reflux disease without esophagitis: Secondary | ICD-10-CM | POA: Diagnosis not present

## 2013-09-23 DIAGNOSIS — S4980XA Other specified injuries of shoulder and upper arm, unspecified arm, initial encounter: Secondary | ICD-10-CM | POA: Diagnosis not present

## 2013-09-23 DIAGNOSIS — F329 Major depressive disorder, single episode, unspecified: Secondary | ICD-10-CM | POA: Diagnosis not present

## 2013-09-23 DIAGNOSIS — S46909A Unspecified injury of unspecified muscle, fascia and tendon at shoulder and upper arm level, unspecified arm, initial encounter: Secondary | ICD-10-CM | POA: Diagnosis not present

## 2013-10-07 DIAGNOSIS — M75120 Complete rotator cuff tear or rupture of unspecified shoulder, not specified as traumatic: Secondary | ICD-10-CM | POA: Diagnosis not present

## 2013-10-23 DIAGNOSIS — M79609 Pain in unspecified limb: Secondary | ICD-10-CM | POA: Diagnosis not present

## 2013-10-23 DIAGNOSIS — R209 Unspecified disturbances of skin sensation: Secondary | ICD-10-CM | POA: Diagnosis not present

## 2013-10-23 DIAGNOSIS — R569 Unspecified convulsions: Secondary | ICD-10-CM | POA: Diagnosis not present

## 2013-10-23 DIAGNOSIS — R413 Other amnesia: Secondary | ICD-10-CM | POA: Diagnosis not present

## 2013-10-26 DIAGNOSIS — F332 Major depressive disorder, recurrent severe without psychotic features: Secondary | ICD-10-CM | POA: Diagnosis not present

## 2013-10-26 DIAGNOSIS — Z79899 Other long term (current) drug therapy: Secondary | ICD-10-CM | POA: Diagnosis not present

## 2013-10-26 DIAGNOSIS — F4389 Other reactions to severe stress: Secondary | ICD-10-CM | POA: Diagnosis not present

## 2013-10-26 DIAGNOSIS — F438 Other reactions to severe stress: Secondary | ICD-10-CM | POA: Diagnosis not present

## 2013-10-30 DIAGNOSIS — R200 Anesthesia of skin: Secondary | ICD-10-CM | POA: Insufficient documentation

## 2013-10-30 DIAGNOSIS — G479 Sleep disorder, unspecified: Secondary | ICD-10-CM | POA: Diagnosis not present

## 2013-10-30 DIAGNOSIS — R5383 Other fatigue: Secondary | ICD-10-CM | POA: Diagnosis not present

## 2013-10-30 DIAGNOSIS — R569 Unspecified convulsions: Secondary | ICD-10-CM | POA: Insufficient documentation

## 2013-10-30 DIAGNOSIS — G723 Periodic paralysis: Secondary | ICD-10-CM | POA: Insufficient documentation

## 2013-10-30 DIAGNOSIS — M79609 Pain in unspecified limb: Secondary | ICD-10-CM | POA: Diagnosis not present

## 2013-10-30 DIAGNOSIS — R5381 Other malaise: Secondary | ICD-10-CM | POA: Diagnosis not present

## 2013-10-30 DIAGNOSIS — R209 Unspecified disturbances of skin sensation: Secondary | ICD-10-CM | POA: Diagnosis not present

## 2013-11-01 DIAGNOSIS — R569 Unspecified convulsions: Secondary | ICD-10-CM | POA: Diagnosis not present

## 2013-11-05 ENCOUNTER — Ambulatory Visit: Payer: Self-pay | Admitting: Pain Medicine

## 2013-11-05 DIAGNOSIS — M5020 Other cervical disc displacement, unspecified cervical region: Secondary | ICD-10-CM | POA: Diagnosis not present

## 2013-11-05 DIAGNOSIS — M47817 Spondylosis without myelopathy or radiculopathy, lumbosacral region: Secondary | ICD-10-CM | POA: Diagnosis not present

## 2013-11-05 DIAGNOSIS — M79609 Pain in unspecified limb: Secondary | ICD-10-CM | POA: Diagnosis not present

## 2013-11-05 DIAGNOSIS — IMO0002 Reserved for concepts with insufficient information to code with codable children: Secondary | ICD-10-CM | POA: Diagnosis not present

## 2013-11-05 DIAGNOSIS — M25519 Pain in unspecified shoulder: Secondary | ICD-10-CM | POA: Diagnosis not present

## 2013-11-05 DIAGNOSIS — M5030 Other cervical disc degeneration, unspecified cervical region: Secondary | ICD-10-CM | POA: Diagnosis not present

## 2013-11-05 DIAGNOSIS — M5430 Sciatica, unspecified side: Secondary | ICD-10-CM | POA: Diagnosis not present

## 2013-11-11 ENCOUNTER — Ambulatory Visit: Payer: Self-pay | Admitting: Pain Medicine

## 2013-11-11 DIAGNOSIS — M67919 Unspecified disorder of synovium and tendon, unspecified shoulder: Secondary | ICD-10-CM | POA: Diagnosis not present

## 2013-11-11 DIAGNOSIS — M5030 Other cervical disc degeneration, unspecified cervical region: Secondary | ICD-10-CM | POA: Diagnosis not present

## 2013-11-11 DIAGNOSIS — M19019 Primary osteoarthritis, unspecified shoulder: Secondary | ICD-10-CM | POA: Diagnosis not present

## 2013-11-12 DIAGNOSIS — G609 Hereditary and idiopathic neuropathy, unspecified: Secondary | ICD-10-CM | POA: Diagnosis not present

## 2013-11-12 DIAGNOSIS — R5381 Other malaise: Secondary | ICD-10-CM | POA: Diagnosis not present

## 2013-11-12 DIAGNOSIS — R5383 Other fatigue: Secondary | ICD-10-CM | POA: Diagnosis not present

## 2013-11-25 DIAGNOSIS — L259 Unspecified contact dermatitis, unspecified cause: Secondary | ICD-10-CM | POA: Diagnosis not present

## 2013-11-25 DIAGNOSIS — G40909 Epilepsy, unspecified, not intractable, without status epilepticus: Secondary | ICD-10-CM | POA: Diagnosis not present

## 2013-11-25 DIAGNOSIS — G2581 Restless legs syndrome: Secondary | ICD-10-CM | POA: Diagnosis not present

## 2013-11-25 DIAGNOSIS — R21 Rash and other nonspecific skin eruption: Secondary | ICD-10-CM | POA: Diagnosis not present

## 2013-11-25 DIAGNOSIS — R609 Edema, unspecified: Secondary | ICD-10-CM | POA: Diagnosis not present

## 2013-11-25 DIAGNOSIS — F329 Major depressive disorder, single episode, unspecified: Secondary | ICD-10-CM | POA: Diagnosis not present

## 2013-11-25 DIAGNOSIS — F3289 Other specified depressive episodes: Secondary | ICD-10-CM | POA: Diagnosis not present

## 2013-11-25 DIAGNOSIS — IMO0001 Reserved for inherently not codable concepts without codable children: Secondary | ICD-10-CM | POA: Diagnosis not present

## 2013-12-01 ENCOUNTER — Ambulatory Visit: Payer: Self-pay | Admitting: Pain Medicine

## 2013-12-01 DIAGNOSIS — R209 Unspecified disturbances of skin sensation: Secondary | ICD-10-CM | POA: Diagnosis not present

## 2013-12-01 DIAGNOSIS — R413 Other amnesia: Secondary | ICD-10-CM | POA: Insufficient documentation

## 2013-12-01 DIAGNOSIS — R5381 Other malaise: Secondary | ICD-10-CM | POA: Diagnosis not present

## 2013-12-01 DIAGNOSIS — M19019 Primary osteoarthritis, unspecified shoulder: Secondary | ICD-10-CM | POA: Diagnosis not present

## 2013-12-01 DIAGNOSIS — M719 Bursopathy, unspecified: Secondary | ICD-10-CM | POA: Diagnosis not present

## 2013-12-01 DIAGNOSIS — M47812 Spondylosis without myelopathy or radiculopathy, cervical region: Secondary | ICD-10-CM | POA: Diagnosis not present

## 2013-12-01 DIAGNOSIS — M546 Pain in thoracic spine: Secondary | ICD-10-CM | POA: Diagnosis not present

## 2013-12-01 DIAGNOSIS — M5030 Other cervical disc degeneration, unspecified cervical region: Secondary | ICD-10-CM | POA: Diagnosis not present

## 2013-12-01 DIAGNOSIS — M79609 Pain in unspecified limb: Secondary | ICD-10-CM | POA: Diagnosis not present

## 2013-12-01 DIAGNOSIS — G479 Sleep disorder, unspecified: Secondary | ICD-10-CM | POA: Diagnosis not present

## 2013-12-01 DIAGNOSIS — M5020 Other cervical disc displacement, unspecified cervical region: Secondary | ICD-10-CM | POA: Diagnosis not present

## 2013-12-01 DIAGNOSIS — M67919 Unspecified disorder of synovium and tendon, unspecified shoulder: Secondary | ICD-10-CM | POA: Diagnosis not present

## 2013-12-01 DIAGNOSIS — Z79899 Other long term (current) drug therapy: Secondary | ICD-10-CM | POA: Diagnosis not present

## 2013-12-04 DIAGNOSIS — G2581 Restless legs syndrome: Secondary | ICD-10-CM | POA: Diagnosis not present

## 2013-12-04 DIAGNOSIS — M329 Systemic lupus erythematosus, unspecified: Secondary | ICD-10-CM | POA: Diagnosis not present

## 2013-12-04 DIAGNOSIS — R609 Edema, unspecified: Secondary | ICD-10-CM | POA: Diagnosis not present

## 2013-12-04 DIAGNOSIS — R5381 Other malaise: Secondary | ICD-10-CM | POA: Diagnosis not present

## 2013-12-07 DIAGNOSIS — R634 Abnormal weight loss: Secondary | ICD-10-CM | POA: Diagnosis not present

## 2013-12-09 ENCOUNTER — Ambulatory Visit: Payer: Self-pay | Admitting: Family Medicine

## 2013-12-09 DIAGNOSIS — R05 Cough: Secondary | ICD-10-CM | POA: Diagnosis not present

## 2013-12-09 DIAGNOSIS — D72829 Elevated white blood cell count, unspecified: Secondary | ICD-10-CM | POA: Diagnosis not present

## 2013-12-09 DIAGNOSIS — F4389 Other reactions to severe stress: Secondary | ICD-10-CM | POA: Diagnosis not present

## 2013-12-09 DIAGNOSIS — E041 Nontoxic single thyroid nodule: Secondary | ICD-10-CM | POA: Diagnosis not present

## 2013-12-09 DIAGNOSIS — F438 Other reactions to severe stress: Secondary | ICD-10-CM | POA: Diagnosis not present

## 2013-12-09 DIAGNOSIS — F17200 Nicotine dependence, unspecified, uncomplicated: Secondary | ICD-10-CM | POA: Diagnosis not present

## 2013-12-09 DIAGNOSIS — R1013 Epigastric pain: Secondary | ICD-10-CM | POA: Diagnosis not present

## 2013-12-09 DIAGNOSIS — R918 Other nonspecific abnormal finding of lung field: Secondary | ICD-10-CM | POA: Diagnosis not present

## 2013-12-09 DIAGNOSIS — I2510 Atherosclerotic heart disease of native coronary artery without angina pectoris: Secondary | ICD-10-CM | POA: Diagnosis not present

## 2013-12-09 DIAGNOSIS — K7689 Other specified diseases of liver: Secondary | ICD-10-CM | POA: Diagnosis not present

## 2013-12-09 DIAGNOSIS — F332 Major depressive disorder, recurrent severe without psychotic features: Secondary | ICD-10-CM | POA: Diagnosis not present

## 2013-12-09 DIAGNOSIS — R059 Cough, unspecified: Secondary | ICD-10-CM | POA: Diagnosis not present

## 2013-12-09 DIAGNOSIS — R634 Abnormal weight loss: Secondary | ICD-10-CM | POA: Diagnosis not present

## 2013-12-09 DIAGNOSIS — R935 Abnormal findings on diagnostic imaging of other abdominal regions, including retroperitoneum: Secondary | ICD-10-CM | POA: Diagnosis not present

## 2013-12-14 ENCOUNTER — Ambulatory Visit: Payer: Self-pay | Admitting: Pain Medicine

## 2013-12-14 ENCOUNTER — Ambulatory Visit: Payer: Self-pay

## 2013-12-14 DIAGNOSIS — Z9851 Tubal ligation status: Secondary | ICD-10-CM | POA: Diagnosis not present

## 2013-12-14 DIAGNOSIS — M329 Systemic lupus erythematosus, unspecified: Secondary | ICD-10-CM | POA: Diagnosis not present

## 2013-12-14 DIAGNOSIS — K5732 Diverticulitis of large intestine without perforation or abscess without bleeding: Secondary | ICD-10-CM | POA: Diagnosis not present

## 2013-12-14 DIAGNOSIS — S81809A Unspecified open wound, unspecified lower leg, initial encounter: Secondary | ICD-10-CM | POA: Diagnosis not present

## 2013-12-14 DIAGNOSIS — M19019 Primary osteoarthritis, unspecified shoulder: Secondary | ICD-10-CM | POA: Diagnosis not present

## 2013-12-14 DIAGNOSIS — G8929 Other chronic pain: Secondary | ICD-10-CM | POA: Diagnosis not present

## 2013-12-14 DIAGNOSIS — S91009A Unspecified open wound, unspecified ankle, initial encounter: Secondary | ICD-10-CM | POA: Diagnosis not present

## 2013-12-14 DIAGNOSIS — L02419 Cutaneous abscess of limb, unspecified: Secondary | ICD-10-CM | POA: Diagnosis not present

## 2013-12-14 DIAGNOSIS — M719 Bursopathy, unspecified: Secondary | ICD-10-CM | POA: Diagnosis not present

## 2013-12-14 DIAGNOSIS — M542 Cervicalgia: Secondary | ICD-10-CM | POA: Diagnosis not present

## 2013-12-14 DIAGNOSIS — F411 Generalized anxiety disorder: Secondary | ICD-10-CM | POA: Diagnosis not present

## 2013-12-14 DIAGNOSIS — IMO0001 Reserved for inherently not codable concepts without codable children: Secondary | ICD-10-CM | POA: Diagnosis not present

## 2013-12-14 DIAGNOSIS — M5030 Other cervical disc degeneration, unspecified cervical region: Secondary | ICD-10-CM | POA: Diagnosis not present

## 2013-12-14 DIAGNOSIS — M67919 Unspecified disorder of synovium and tendon, unspecified shoulder: Secondary | ICD-10-CM | POA: Diagnosis not present

## 2013-12-14 DIAGNOSIS — M546 Pain in thoracic spine: Secondary | ICD-10-CM | POA: Diagnosis not present

## 2013-12-14 DIAGNOSIS — G2581 Restless legs syndrome: Secondary | ICD-10-CM | POA: Diagnosis not present

## 2013-12-14 DIAGNOSIS — S81009A Unspecified open wound, unspecified knee, initial encounter: Secondary | ICD-10-CM | POA: Diagnosis not present

## 2013-12-14 DIAGNOSIS — Z79899 Other long term (current) drug therapy: Secondary | ICD-10-CM | POA: Diagnosis not present

## 2013-12-15 ENCOUNTER — Encounter: Payer: Self-pay | Admitting: Neurology

## 2013-12-15 ENCOUNTER — Ambulatory Visit (INDEPENDENT_AMBULATORY_CARE_PROVIDER_SITE_OTHER): Payer: Medicare Other | Admitting: Neurology

## 2013-12-15 VITALS — BP 110/60 | HR 86 | Ht 64.57 in | Wt 129.4 lb

## 2013-12-15 DIAGNOSIS — R29898 Other symptoms and signs involving the musculoskeletal system: Secondary | ICD-10-CM

## 2013-12-15 DIAGNOSIS — R269 Unspecified abnormalities of gait and mobility: Secondary | ICD-10-CM | POA: Diagnosis not present

## 2013-12-15 LAB — CK: Total CK: 53 U/L (ref 7–177)

## 2013-12-15 LAB — VITAMIN B12: Vitamin B-12: 434 pg/mL (ref 211–911)

## 2013-12-15 NOTE — Progress Notes (Signed)
[facility] Clinic Note - Initial Visit   Date: 12/16/2013  Paula Frank MRN: [id number] DOB: 1940-07-28   Dear Dr. Melrose Nakayama:   Thank you for your kind referral of Paula Frank for consultation of proximal leg the. Although her history is well known to you, please allow [place] to reiterate it for the purpose of our medical record. The patient was accompanied to the clinic by self.    History of Present Illness: Paula Frank is a 73 y.o. right-handed Caucasian with history of seizure disorder, bipolar disorder (suicide attempt July 2013), lupus anticoagulant disorder, nephrolithiasis, diverticulitis, restless leg syndrome, and current tobacco use presenting for evaluation of gait unsteadiness and leg weakness. Patient is a poor historian and seems very distracted at today's visit by severe generalized itching. In fact, she was itching her arms and legs so much to the point where she was bleeding from multiple sites while weakening in our lobby. Unfortunately, at least 15 minutes of this office visit was spent trying to calm her and get her arms and legs wrapped.    Patient is unsure why she is here today, but note received from Dr. Lannie Fields office indicate second opinion of bilateral leg weakness.  Since April 2015, she felt as if her gait is more unstable and has fallen a few times.  She feels as if her legs give out from under her.  She feels that her depth perception is off and she can trip over curbs.  She has chronic pain of her arm from rotator cuff tear, and has seen pain management.  She has numbness/tingling of the arms, worse on the left arm.   Denies any preceding illness, trauma, or infection.  Denies any problems with shortness of breath or swallowing. He denies any back pain.  She is also very anxious, tearful at times, and constantly moving her arms and legs, and picking at her skin which she reports being nervous. It is difficult to obtain  sequential history from patient as her thought content is tangential.  She has been evaluated by Dr. Melrose Nakayama at [facility] for left arm pain and bilateral leg pain. 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    She has a history of alcohol abuse in the past.  Currently, reports to drinking 1-2 glasses of wine per night.     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  Labs/09/2012: TSH 1.57, creatinine 0.76, AST 17, ALT 15  CT head without contrast 04/15/2013: Stable CT of the brain. Changes or atrophy within normal limits for the patient's age. No acute intracranial abnormality.   Past Medical History  Diagnosis Date  . Lupus (systemic lupus erythematosus)   . Discoid lupus   . Gastritis     Past Surgical History  Procedure Laterality Date  . Tubal ligation    . Abdominal hysterectomy    . Laparoscopy    . Rotator cuff repair    . Lithotripsy    . Knee arthroscopy w/ meniscectomy       Medications:  Nuvigil 239m daily Keppra 5086mBID Zofran 84m46m8h prn nausea Seroquel 400m75m bedtime Requip 2mg 84mly   Allergies:  Allergies  Allergen Reactions  . Ivp Dye [Iodinated  Diagnostic Agents]   . Levaquin [Levofloxacin In D5w]   . Sulfa Antibiotics     Family History: Family History  Problem Relation Age of Onset  . Cancer Son   . Cancer Mother   . Esophageal cancer Father   . Breast cancer Sister     Social History: History   Social History  . Marital Status: Single    Spouse Name: N/A    Number of Children: 3  . Years of Education: N/A   Occupational History  . retired Therapist, sports    Social History Main Topics  . Smoking status: Current  Every Day Smoker -- 1.00 packs/day for 60 years    Types: Cigarettes  . Smokeless tobacco: Never Used  . Alcohol Use: Yes     Comment: 1-2 glass of wine nightly.    . Drug Use: No  . Sexual Activity: Not on file   Other Topics Concern  . Not on file   Social History Narrative   Lives with friend.   She retired Therapist, sports and worked in psychiatry.          Review of Systems:  CONSTITUTIONAL: No fevers, chills, night sweats, or weight loss.    EYES: No visual changes or eye pain ENT: No hearing changes.  No history of nose bleeds.   RESPIRATORY: No cough, wheezing and shortness of breath.   CARDIOVASCULAR: Negative for chest pain, and palpitations.   GI: Negative for abdominal discomfort, blood in stools or black stools.  No recent change in bowel habits.   GU:  No history of incontinence.   MUSCLOSKELETAL: No history of joint pain or swelling.  No myalgias.   SKIN: ++ for lesions, rash, and itching.   HEMATOLOGY/ONCOLOGY: Negative for prolonged bleeding, bruising easily, and swollen nodes.  ENDOCRINE: Negative for cold or heat intolerance, polydipsia or goiter.   PSYCH:  +depression or anxiety symptoms.   NEURO: As Above.   Vital Signs:  BP 110/60  Pulse 86  Ht 5' 4.57" (1.64 m)  Wt 129 lb 6 oz (58.684 kg)  BMI 21.82 kg/m2  SpO2 92%    General Medical Exam:   General:  Tearful, anxious, pressured speech, picking at her skin constantly.   Eyes/ENT: see cranial nerve examination.   Neck: No masses appreciated.  Full range of motion without tenderness.  No carotid bruits. Respiratory:  Clear to auscultation, good air entry bilaterally.   Cardiac:  Regular rate and rhythm, no murmur.    Extremities:  No deformities, edema, or skin discoloration. Skin:  Multiple bleeding excoriations of her skin from itching   Neurological Exam: MENTAL STATUS including orientation to time, place, person, recent and remote memory, attention span and concentration, language, and fund of  knowledge is fair.  She is very easily distracted. Speech is pressured at times. Speech is slightly slurred.  CRANIAL NERVES: II:  No visual field defects.  Unremarkable fundi.   III-IV-VI: Pupils equal round and reactive to light.  Normal conjugate, extra-ocular eye movements in all directions of gaze.  No nystagmus.  No ptosis.   V:  Normal facial sensation.  Jaw jerk is absent.   VII:  Normal facial symmetry and movements.  No pathologic facial reflexes.  VIII:  Normal hearing and vestibular function.   IX-X:  Normal palatal movement.   XI:  Normal shoulder shrug and head rotation.   XII:  Normal tongue strength and range of motion, no deviation or fasciculation.  MOTOR:  No atrophy, fasciculations or abnormal  movements.  No pronator drift.  Tone is normal.    Right Upper Extremity:    Left Upper Extremity:    Deltoid  5/5   Deltoid  5/5   Biceps  5/5   Biceps  5/5   Triceps  5/5   Triceps  5/5   Wrist extensors  5/5   Wrist extensors  5/5   Wrist flexors  5/5   Wrist flexors  5/5   Finger extensors  5/5   Finger extensors  5/5   Finger flexors  5/5   Finger flexors  5/5   Dorsal interossei  5/5   Dorsal interossei  5/5   Abductor pollicis  5/5   Abductor pollicis  5/5   Tone (Ashworth scale)  0  Tone (Ashworth scale)  0   Right Lower Extremity:    Left Lower Extremity:    Hip flexors  3+/5   Hip flexors  3+/5   Hip extensors  5/5   Hip extensors  5/5   Adductors 4/5  Adductor 4/5  Abductor 5/5  Abductor  5/5  Knee flexors  5-/5   Knee flexors  5-/5   Knee extensors  5/5   Knee extensors  5/5   Dorsiflexors  5/5   Dorsiflexors  5/5   Plantarflexors  5/5   Plantarflexors  5/5   Toe extensors  5/5   Toe extensors  5/5   Toe flexors  5/5   Toe flexors  5/5   Tone (Ashworth scale)  0  Tone (Ashworth scale)  0   MSRs:  Right                                                                 Left brachioradialis 2+  brachioradialis 2+  biceps 2+  biceps 2+  triceps 2+  triceps 2+   patellar 3+  patellar 3+  ankle jerk 2+  ankle jerk 2+  Hoffman no  Hoffman no  plantar response down  plantar response down   SENSORY:  Diminished perception of pinprick and vibration distal to ankles.  COORDINATION/GAIT:  Dysmetria with finger-nose testing bilaterally. Decrement with rapid alternating movements bilaterally.  She is able to rise from a chair without using arms up to several times. Gait is very unstable, wide-based, with ataxic quality. Arms are outstretched is trying to find support. She is unable to perform stressed with tandem gait.     IMPRESSION: Ms. Paula Frank is a 73 year-old female presenting for evaluation of proximal leg weakness. The patient's mental status limits detailed history and assessment. From what I am able to gather, she has had subacute onset of worsening proximal leg weakness and numbness. Her EMG showed evidence of polyneuropathy affecting the right leg, which is inconsistent with her clinical exam given her prominent proximal weakness. I would like to obtain imaging of the lumbar spine to look for L2-L4 radiculopathy.  I will also check for neuropathy as well as myopathy labs. Because of her mental status changes and bizarre behavior in the clinic, I will also check a drug screen as well as infectious workup for HIV and RPR.  I am not sure of her baseline mental status, but if these are new findings of psychosis and neuropathy, porphyria can be considered going  forward.   PLAN/RECOMMENDATIONS:  1.  Check CK, aldolase, SPEP/UPEP with IFE, RPR, vitamin B12, GGT, copper, zinc, ESR, CRP, urine drug screen, HIV 2.  MRI lumbar spine  3.  Consider repeat EMG of the left side going forward 4.  Strongly recommend using walker for assistance 5.  Recommend physical therapy, patient refused 6.  Return to clinic in 2 months.   The duration of this appointment visit was 65 minutes of face-to-face time with the patient.  Greater than 50% of this time was spent in  counseling, explanation of diagnosis, planning of further management, and coordination of care.   Thank you for allowing me to participate in patient's care.  If I can answer any additional questions, I would be pleased to do so.    Sincerely,    Donika K. Posey Pronto, DO

## 2013-12-15 NOTE — Patient Instructions (Signed)
1.  Check blood work 2.  MRI lumbar spine  3.  Consider repeat EMG of the left side going forward 4.  Strongly recommend using walker for assistance 5.  Recommend physical therapy 6.

## 2013-12-16 LAB — DRUG SCREEN, URINE
Amphetamine Screen, Ur: NEGATIVE
Barbiturate Quant, Ur: NEGATIVE
Benzodiazepines.: NEGATIVE
COCAINE METABOLITES: NEGATIVE
Creatinine,U: 64.72 mg/dL
MARIJUANA METABOLITE: NEGATIVE
Methadone: NEGATIVE
OPIATES: NEGATIVE
PHENCYCLIDINE (PCP): NEGATIVE
Propoxyphene: NEGATIVE

## 2013-12-16 LAB — HIV ANTIBODY (ROUTINE TESTING W REFLEX): HIV 1&2 Ab, 4th Generation: NONREACTIVE

## 2013-12-16 LAB — GAMMA GT: GGT: 81 U/L — ABNORMAL HIGH (ref 7–51)

## 2013-12-16 LAB — RPR

## 2013-12-16 NOTE — Progress Notes (Signed)
Note faxed.

## 2013-12-17 LAB — SPEP & IFE WITH QIG
Albumin ELP: 48.5 % — ABNORMAL LOW (ref 55.8–66.1)
Alpha-1-Globulin: 7.7 % — ABNORMAL HIGH (ref 2.9–4.9)
Alpha-2-Globulin: 11.2 % (ref 7.1–11.8)
Beta 2: 5.7 % (ref 3.2–6.5)
Beta Globulin: 6.5 % (ref 4.7–7.2)
GAMMA GLOBULIN: 20.4 % — AB (ref 11.1–18.8)
IGG (IMMUNOGLOBIN G), SERUM: 1360 mg/dL (ref 690–1700)
IGM, SERUM: 50 mg/dL — AB (ref 52–322)
IgA: 282 mg/dL (ref 69–380)
Total Protein, Serum Electrophoresis: 5.9 g/dL — ABNORMAL LOW (ref 6.0–8.3)

## 2013-12-17 LAB — ZINC: Zinc: 51 ug/dL — ABNORMAL LOW (ref 60–130)

## 2013-12-17 LAB — ALDOLASE: ALDOLASE: 8.7 U/L — AB (ref <=8.1)

## 2013-12-17 LAB — COPPER, SERUM: Copper: 103 ug/dL (ref 70–175)

## 2013-12-21 ENCOUNTER — Telehealth: Payer: Self-pay | Admitting: Neurology

## 2013-12-21 DIAGNOSIS — K7689 Other specified diseases of liver: Secondary | ICD-10-CM | POA: Diagnosis not present

## 2013-12-21 DIAGNOSIS — D649 Anemia, unspecified: Secondary | ICD-10-CM | POA: Diagnosis not present

## 2013-12-21 DIAGNOSIS — D72829 Elevated white blood cell count, unspecified: Secondary | ICD-10-CM | POA: Diagnosis not present

## 2013-12-21 DIAGNOSIS — K5289 Other specified noninfective gastroenteritis and colitis: Secondary | ICD-10-CM | POA: Diagnosis not present

## 2013-12-21 LAB — UIFE/LIGHT CHAINS/TP QN, 24-HR UR
Albumin, U: DETECTED
Alpha 1, Urine: DETECTED — AB
Alpha 2, Urine: DETECTED — AB
Beta, Urine: DETECTED — AB
FREE KAPPA LT CHAINS, UR: 14.1 mg/dL — AB (ref 0.14–2.42)
FREE KAPPA/LAMBDA RATIO: 7.16 ratio (ref 2.04–10.37)
Free Lambda Lt Chains,Ur: 1.97 mg/dL — ABNORMAL HIGH (ref 0.02–0.67)
Gamma Globulin, Urine: DETECTED — AB
TOTAL PROTEIN, URINE-UPE24: 16.6 mg/dL

## 2013-12-21 NOTE — Telephone Encounter (Signed)
Patient given results

## 2013-12-21 NOTE — Telephone Encounter (Signed)
Pt called/ returning your call at 3:55 PM. C/B [phone]

## 2013-12-29 ENCOUNTER — Ambulatory Visit: Payer: Self-pay | Admitting: Neurology

## 2013-12-29 DIAGNOSIS — M5137 Other intervertebral disc degeneration, lumbosacral region: Secondary | ICD-10-CM | POA: Diagnosis not present

## 2013-12-29 DIAGNOSIS — M47817 Spondylosis without myelopathy or radiculopathy, lumbosacral region: Secondary | ICD-10-CM | POA: Diagnosis not present

## 2013-12-29 DIAGNOSIS — M48061 Spinal stenosis, lumbar region without neurogenic claudication: Secondary | ICD-10-CM | POA: Diagnosis not present

## 2013-12-29 DIAGNOSIS — R29898 Other symptoms and signs involving the musculoskeletal system: Secondary | ICD-10-CM | POA: Diagnosis not present

## 2013-12-29 DIAGNOSIS — R269 Unspecified abnormalities of gait and mobility: Secondary | ICD-10-CM | POA: Diagnosis not present

## 2014-01-01 DIAGNOSIS — F3289 Other specified depressive episodes: Secondary | ICD-10-CM | POA: Diagnosis not present

## 2014-01-01 DIAGNOSIS — F329 Major depressive disorder, single episode, unspecified: Secondary | ICD-10-CM | POA: Diagnosis not present

## 2014-01-01 DIAGNOSIS — M329 Systemic lupus erythematosus, unspecified: Secondary | ICD-10-CM | POA: Diagnosis not present

## 2014-01-01 DIAGNOSIS — K219 Gastro-esophageal reflux disease without esophagitis: Secondary | ICD-10-CM | POA: Diagnosis not present

## 2014-01-06 DIAGNOSIS — R21 Rash and other nonspecific skin eruption: Secondary | ICD-10-CM | POA: Diagnosis not present

## 2014-01-06 DIAGNOSIS — F332 Major depressive disorder, recurrent severe without psychotic features: Secondary | ICD-10-CM | POA: Diagnosis not present

## 2014-01-06 DIAGNOSIS — F3289 Other specified depressive episodes: Secondary | ICD-10-CM | POA: Diagnosis not present

## 2014-01-06 DIAGNOSIS — F438 Other reactions to severe stress: Secondary | ICD-10-CM | POA: Diagnosis not present

## 2014-01-06 DIAGNOSIS — G2581 Restless legs syndrome: Secondary | ICD-10-CM | POA: Diagnosis not present

## 2014-01-06 DIAGNOSIS — F329 Major depressive disorder, single episode, unspecified: Secondary | ICD-10-CM | POA: Diagnosis not present

## 2014-01-06 DIAGNOSIS — F4389 Other reactions to severe stress: Secondary | ICD-10-CM | POA: Diagnosis not present

## 2014-01-06 DIAGNOSIS — G40909 Epilepsy, unspecified, not intractable, without status epilepticus: Secondary | ICD-10-CM | POA: Diagnosis not present

## 2014-01-07 DIAGNOSIS — H2510 Age-related nuclear cataract, unspecified eye: Secondary | ICD-10-CM | POA: Diagnosis not present

## 2014-01-11 ENCOUNTER — Ambulatory Visit: Payer: Self-pay | Admitting: Pain Medicine

## 2014-01-11 ENCOUNTER — Other Ambulatory Visit: Payer: Self-pay | Admitting: Urgent Care

## 2014-01-11 DIAGNOSIS — M7989 Other specified soft tissue disorders: Secondary | ICD-10-CM | POA: Diagnosis not present

## 2014-01-11 DIAGNOSIS — M5030 Other cervical disc degeneration, unspecified cervical region: Secondary | ICD-10-CM | POA: Diagnosis not present

## 2014-01-11 DIAGNOSIS — M329 Systemic lupus erythematosus, unspecified: Secondary | ICD-10-CM | POA: Diagnosis not present

## 2014-01-11 DIAGNOSIS — L97209 Non-pressure chronic ulcer of unspecified calf with unspecified severity: Secondary | ICD-10-CM | POA: Diagnosis not present

## 2014-01-11 DIAGNOSIS — M5020 Other cervical disc displacement, unspecified cervical region: Secondary | ICD-10-CM | POA: Diagnosis not present

## 2014-01-11 DIAGNOSIS — M79609 Pain in unspecified limb: Secondary | ICD-10-CM | POA: Diagnosis not present

## 2014-01-11 DIAGNOSIS — M19019 Primary osteoarthritis, unspecified shoulder: Secondary | ICD-10-CM | POA: Diagnosis not present

## 2014-01-11 DIAGNOSIS — M67919 Unspecified disorder of synovium and tendon, unspecified shoulder: Secondary | ICD-10-CM | POA: Diagnosis not present

## 2014-01-14 DIAGNOSIS — M329 Systemic lupus erythematosus, unspecified: Secondary | ICD-10-CM | POA: Insufficient documentation

## 2014-01-14 DIAGNOSIS — R413 Other amnesia: Secondary | ICD-10-CM | POA: Diagnosis not present

## 2014-01-14 DIAGNOSIS — R569 Unspecified convulsions: Secondary | ICD-10-CM | POA: Diagnosis not present

## 2014-01-14 DIAGNOSIS — M79609 Pain in unspecified limb: Secondary | ICD-10-CM | POA: Diagnosis not present

## 2014-01-14 DIAGNOSIS — R209 Unspecified disturbances of skin sensation: Secondary | ICD-10-CM | POA: Diagnosis not present

## 2014-01-27 DIAGNOSIS — M79609 Pain in unspecified limb: Secondary | ICD-10-CM | POA: Diagnosis not present

## 2014-01-27 DIAGNOSIS — M7989 Other specified soft tissue disorders: Secondary | ICD-10-CM | POA: Diagnosis not present

## 2014-01-27 DIAGNOSIS — F17200 Nicotine dependence, unspecified, uncomplicated: Secondary | ICD-10-CM | POA: Diagnosis not present

## 2014-02-03 DIAGNOSIS — M25519 Pain in unspecified shoulder: Secondary | ICD-10-CM | POA: Diagnosis not present

## 2014-02-03 DIAGNOSIS — IMO0001 Reserved for inherently not codable concepts without codable children: Secondary | ICD-10-CM | POA: Diagnosis not present

## 2014-02-03 DIAGNOSIS — M542 Cervicalgia: Secondary | ICD-10-CM | POA: Diagnosis not present

## 2014-02-04 ENCOUNTER — Ambulatory Visit: Payer: Self-pay | Admitting: Pain Medicine

## 2014-02-04 DIAGNOSIS — M5030 Other cervical disc degeneration, unspecified cervical region: Secondary | ICD-10-CM | POA: Diagnosis not present

## 2014-02-04 DIAGNOSIS — M5137 Other intervertebral disc degeneration, lumbosacral region: Secondary | ICD-10-CM | POA: Diagnosis not present

## 2014-02-04 DIAGNOSIS — M19019 Primary osteoarthritis, unspecified shoulder: Secondary | ICD-10-CM | POA: Diagnosis not present

## 2014-02-05 ENCOUNTER — Ambulatory Visit: Payer: Self-pay | Admitting: Gastroenterology

## 2014-02-05 DIAGNOSIS — D649 Anemia, unspecified: Secondary | ICD-10-CM | POA: Diagnosis not present

## 2014-02-05 DIAGNOSIS — K2970 Gastritis, unspecified, without bleeding: Secondary | ICD-10-CM | POA: Diagnosis not present

## 2014-02-05 DIAGNOSIS — K2990 Gastroduodenitis, unspecified, without bleeding: Secondary | ICD-10-CM | POA: Diagnosis not present

## 2014-02-05 DIAGNOSIS — K5730 Diverticulosis of large intestine without perforation or abscess without bleeding: Secondary | ICD-10-CM | POA: Diagnosis not present

## 2014-02-05 DIAGNOSIS — K2100 Gastro-esophageal reflux disease with esophagitis, without bleeding: Secondary | ICD-10-CM | POA: Diagnosis not present

## 2014-02-05 DIAGNOSIS — K5900 Constipation, unspecified: Secondary | ICD-10-CM | POA: Diagnosis not present

## 2014-02-05 DIAGNOSIS — R110 Nausea: Secondary | ICD-10-CM | POA: Diagnosis not present

## 2014-02-05 DIAGNOSIS — D126 Benign neoplasm of colon, unspecified: Secondary | ICD-10-CM | POA: Diagnosis not present

## 2014-02-05 DIAGNOSIS — D130 Benign neoplasm of esophagus: Secondary | ICD-10-CM | POA: Diagnosis not present

## 2014-02-05 DIAGNOSIS — R634 Abnormal weight loss: Secondary | ICD-10-CM | POA: Diagnosis not present

## 2014-02-05 DIAGNOSIS — K209 Esophagitis, unspecified without bleeding: Secondary | ICD-10-CM | POA: Diagnosis not present

## 2014-02-05 DIAGNOSIS — D509 Iron deficiency anemia, unspecified: Secondary | ICD-10-CM | POA: Diagnosis not present

## 2014-02-09 DIAGNOSIS — E041 Nontoxic single thyroid nodule: Secondary | ICD-10-CM | POA: Diagnosis not present

## 2014-02-10 ENCOUNTER — Ambulatory Visit: Payer: Self-pay | Admitting: Rheumatology

## 2014-02-10 DIAGNOSIS — R609 Edema, unspecified: Secondary | ICD-10-CM | POA: Diagnosis not present

## 2014-02-10 DIAGNOSIS — M542 Cervicalgia: Secondary | ICD-10-CM | POA: Diagnosis not present

## 2014-02-10 DIAGNOSIS — G959 Disease of spinal cord, unspecified: Secondary | ICD-10-CM | POA: Diagnosis not present

## 2014-02-10 DIAGNOSIS — M4802 Spinal stenosis, cervical region: Secondary | ICD-10-CM | POA: Diagnosis not present

## 2014-02-10 DIAGNOSIS — M5030 Other cervical disc degeneration, unspecified cervical region: Secondary | ICD-10-CM | POA: Diagnosis not present

## 2014-02-10 DIAGNOSIS — M47812 Spondylosis without myelopathy or radiculopathy, cervical region: Secondary | ICD-10-CM | POA: Diagnosis not present

## 2014-02-10 LAB — PATHOLOGY REPORT

## 2014-02-12 DIAGNOSIS — M542 Cervicalgia: Secondary | ICD-10-CM | POA: Diagnosis not present

## 2014-02-12 DIAGNOSIS — M25519 Pain in unspecified shoulder: Secondary | ICD-10-CM | POA: Diagnosis not present

## 2014-02-12 DIAGNOSIS — G589 Mononeuropathy, unspecified: Secondary | ICD-10-CM | POA: Diagnosis not present

## 2014-02-15 ENCOUNTER — Ambulatory Visit: Payer: Self-pay | Admitting: Pain Medicine

## 2014-02-15 DIAGNOSIS — R21 Rash and other nonspecific skin eruption: Secondary | ICD-10-CM | POA: Diagnosis not present

## 2014-02-15 DIAGNOSIS — M79609 Pain in unspecified limb: Secondary | ICD-10-CM | POA: Diagnosis not present

## 2014-02-15 DIAGNOSIS — M25519 Pain in unspecified shoulder: Secondary | ICD-10-CM | POA: Diagnosis not present

## 2014-02-15 DIAGNOSIS — F3289 Other specified depressive episodes: Secondary | ICD-10-CM | POA: Diagnosis not present

## 2014-02-15 DIAGNOSIS — M542 Cervicalgia: Secondary | ICD-10-CM | POA: Diagnosis not present

## 2014-02-15 DIAGNOSIS — M67919 Unspecified disorder of synovium and tendon, unspecified shoulder: Secondary | ICD-10-CM | POA: Diagnosis not present

## 2014-02-15 DIAGNOSIS — M47812 Spondylosis without myelopathy or radiculopathy, cervical region: Secondary | ICD-10-CM | POA: Diagnosis not present

## 2014-02-15 DIAGNOSIS — K219 Gastro-esophageal reflux disease without esophagitis: Secondary | ICD-10-CM | POA: Diagnosis not present

## 2014-02-15 DIAGNOSIS — G40909 Epilepsy, unspecified, not intractable, without status epilepticus: Secondary | ICD-10-CM | POA: Diagnosis not present

## 2014-02-15 DIAGNOSIS — F329 Major depressive disorder, single episode, unspecified: Secondary | ICD-10-CM | POA: Diagnosis not present

## 2014-02-15 DIAGNOSIS — M719 Bursopathy, unspecified: Secondary | ICD-10-CM | POA: Diagnosis not present

## 2014-02-16 DIAGNOSIS — F4389 Other reactions to severe stress: Secondary | ICD-10-CM | POA: Diagnosis not present

## 2014-02-16 DIAGNOSIS — F332 Major depressive disorder, recurrent severe without psychotic features: Secondary | ICD-10-CM | POA: Diagnosis not present

## 2014-02-16 DIAGNOSIS — F438 Other reactions to severe stress: Secondary | ICD-10-CM | POA: Diagnosis not present

## 2014-02-17 DIAGNOSIS — L2089 Other atopic dermatitis: Secondary | ICD-10-CM | POA: Diagnosis not present

## 2014-02-19 DIAGNOSIS — R569 Unspecified convulsions: Secondary | ICD-10-CM | POA: Diagnosis not present

## 2014-02-19 DIAGNOSIS — R413 Other amnesia: Secondary | ICD-10-CM | POA: Diagnosis not present

## 2014-02-19 DIAGNOSIS — Z720 Tobacco use: Secondary | ICD-10-CM | POA: Insufficient documentation

## 2014-02-19 DIAGNOSIS — R5383 Other fatigue: Secondary | ICD-10-CM | POA: Diagnosis not present

## 2014-02-19 DIAGNOSIS — M79609 Pain in unspecified limb: Secondary | ICD-10-CM | POA: Diagnosis not present

## 2014-03-04 DIAGNOSIS — H11159 Pinguecula, unspecified eye: Secondary | ICD-10-CM | POA: Diagnosis not present

## 2014-03-04 DIAGNOSIS — H01009 Unspecified blepharitis unspecified eye, unspecified eyelid: Secondary | ICD-10-CM | POA: Diagnosis not present

## 2014-03-04 DIAGNOSIS — H2510 Age-related nuclear cataract, unspecified eye: Secondary | ICD-10-CM | POA: Diagnosis not present

## 2014-03-04 DIAGNOSIS — H43819 Vitreous degeneration, unspecified eye: Secondary | ICD-10-CM | POA: Diagnosis not present

## 2014-03-09 DIAGNOSIS — H43819 Vitreous degeneration, unspecified eye: Secondary | ICD-10-CM | POA: Diagnosis not present

## 2014-03-09 DIAGNOSIS — H2510 Age-related nuclear cataract, unspecified eye: Secondary | ICD-10-CM | POA: Diagnosis not present

## 2014-03-09 DIAGNOSIS — H11159 Pinguecula, unspecified eye: Secondary | ICD-10-CM | POA: Diagnosis not present

## 2014-03-09 DIAGNOSIS — H01009 Unspecified blepharitis unspecified eye, unspecified eyelid: Secondary | ICD-10-CM | POA: Diagnosis not present

## 2014-03-15 DIAGNOSIS — Z87891 Personal history of nicotine dependence: Secondary | ICD-10-CM | POA: Diagnosis not present

## 2014-03-15 DIAGNOSIS — H2589 Other age-related cataract: Secondary | ICD-10-CM | POA: Diagnosis not present

## 2014-03-15 DIAGNOSIS — H2510 Age-related nuclear cataract, unspecified eye: Secondary | ICD-10-CM | POA: Diagnosis not present

## 2014-03-16 ENCOUNTER — Ambulatory Visit: Payer: Self-pay | Admitting: Pain Medicine

## 2014-03-16 DIAGNOSIS — M546 Pain in thoracic spine: Secondary | ICD-10-CM | POA: Diagnosis not present

## 2014-03-16 DIAGNOSIS — M19019 Primary osteoarthritis, unspecified shoulder: Secondary | ICD-10-CM | POA: Diagnosis not present

## 2014-03-16 DIAGNOSIS — M67919 Unspecified disorder of synovium and tendon, unspecified shoulder: Secondary | ICD-10-CM | POA: Diagnosis not present

## 2014-03-16 DIAGNOSIS — IMO0002 Reserved for concepts with insufficient information to code with codable children: Secondary | ICD-10-CM | POA: Diagnosis not present

## 2014-03-16 DIAGNOSIS — M47812 Spondylosis without myelopathy or radiculopathy, cervical region: Secondary | ICD-10-CM | POA: Diagnosis not present

## 2014-03-16 DIAGNOSIS — M62838 Other muscle spasm: Secondary | ICD-10-CM | POA: Diagnosis not present

## 2014-03-22 DIAGNOSIS — F332 Major depressive disorder, recurrent severe without psychotic features: Secondary | ICD-10-CM | POA: Diagnosis not present

## 2014-03-22 DIAGNOSIS — F4320 Adjustment disorder, unspecified: Secondary | ICD-10-CM | POA: Diagnosis not present

## 2014-03-24 DIAGNOSIS — H2510 Age-related nuclear cataract, unspecified eye: Secondary | ICD-10-CM | POA: Diagnosis not present

## 2014-03-30 DIAGNOSIS — M329 Systemic lupus erythematosus, unspecified: Secondary | ICD-10-CM | POA: Diagnosis not present

## 2014-03-30 DIAGNOSIS — R110 Nausea: Secondary | ICD-10-CM | POA: Diagnosis not present

## 2014-03-30 DIAGNOSIS — L03119 Cellulitis of unspecified part of limb: Secondary | ICD-10-CM | POA: Diagnosis not present

## 2014-03-30 DIAGNOSIS — M797 Fibromyalgia: Secondary | ICD-10-CM | POA: Diagnosis not present

## 2014-03-30 DIAGNOSIS — F329 Major depressive disorder, single episode, unspecified: Secondary | ICD-10-CM | POA: Diagnosis not present

## 2014-03-30 DIAGNOSIS — K219 Gastro-esophageal reflux disease without esophagitis: Secondary | ICD-10-CM | POA: Diagnosis not present

## 2014-03-30 DIAGNOSIS — Z008 Encounter for other general examination: Secondary | ICD-10-CM | POA: Diagnosis not present

## 2014-03-30 DIAGNOSIS — G40909 Epilepsy, unspecified, not intractable, without status epilepticus: Secondary | ICD-10-CM | POA: Diagnosis not present

## 2014-03-31 ENCOUNTER — Ambulatory Visit: Payer: Self-pay | Admitting: Pain Medicine

## 2014-03-31 DIAGNOSIS — M19011 Primary osteoarthritis, right shoulder: Secondary | ICD-10-CM | POA: Diagnosis not present

## 2014-03-31 DIAGNOSIS — M25511 Pain in right shoulder: Secondary | ICD-10-CM | POA: Diagnosis not present

## 2014-04-12 DIAGNOSIS — H2510 Age-related nuclear cataract, unspecified eye: Secondary | ICD-10-CM | POA: Diagnosis not present

## 2014-04-12 DIAGNOSIS — H2512 Age-related nuclear cataract, left eye: Secondary | ICD-10-CM | POA: Diagnosis not present

## 2014-05-05 DIAGNOSIS — L03119 Cellulitis of unspecified part of limb: Secondary | ICD-10-CM | POA: Diagnosis not present

## 2014-05-05 DIAGNOSIS — R21 Rash and other nonspecific skin eruption: Secondary | ICD-10-CM | POA: Diagnosis not present

## 2014-05-05 DIAGNOSIS — M25572 Pain in left ankle and joints of left foot: Secondary | ICD-10-CM | POA: Diagnosis not present

## 2014-05-06 DIAGNOSIS — L03119 Cellulitis of unspecified part of limb: Secondary | ICD-10-CM | POA: Diagnosis not present

## 2014-05-07 DIAGNOSIS — Z1389 Encounter for screening for other disorder: Secondary | ICD-10-CM | POA: Diagnosis not present

## 2014-05-07 DIAGNOSIS — R600 Localized edema: Secondary | ICD-10-CM | POA: Diagnosis not present

## 2014-05-07 DIAGNOSIS — R21 Rash and other nonspecific skin eruption: Secondary | ICD-10-CM | POA: Diagnosis not present

## 2014-05-07 DIAGNOSIS — K029 Dental caries, unspecified: Secondary | ICD-10-CM | POA: Diagnosis not present

## 2014-05-10 ENCOUNTER — Ambulatory Visit: Payer: Self-pay | Admitting: Pain Medicine

## 2014-05-10 DIAGNOSIS — M19012 Primary osteoarthritis, left shoulder: Secondary | ICD-10-CM | POA: Diagnosis not present

## 2014-05-10 DIAGNOSIS — M5033 Other cervical disc degeneration, cervicothoracic region: Secondary | ICD-10-CM | POA: Diagnosis not present

## 2014-05-10 DIAGNOSIS — M542 Cervicalgia: Secondary | ICD-10-CM | POA: Diagnosis not present

## 2014-05-10 DIAGNOSIS — M19011 Primary osteoarthritis, right shoulder: Secondary | ICD-10-CM | POA: Diagnosis not present

## 2014-05-26 DIAGNOSIS — F332 Major depressive disorder, recurrent severe without psychotic features: Secondary | ICD-10-CM | POA: Diagnosis not present

## 2014-05-26 DIAGNOSIS — F439 Reaction to severe stress, unspecified: Secondary | ICD-10-CM | POA: Diagnosis not present

## 2014-05-26 DIAGNOSIS — F438 Other reactions to severe stress: Secondary | ICD-10-CM | POA: Diagnosis not present

## 2014-06-10 ENCOUNTER — Ambulatory Visit: Payer: Self-pay | Admitting: Pain Medicine

## 2014-06-10 DIAGNOSIS — M47812 Spondylosis without myelopathy or radiculopathy, cervical region: Secondary | ICD-10-CM | POA: Diagnosis not present

## 2014-06-10 DIAGNOSIS — M47892 Other spondylosis, cervical region: Secondary | ICD-10-CM | POA: Diagnosis not present

## 2014-06-10 DIAGNOSIS — M19011 Primary osteoarthritis, right shoulder: Secondary | ICD-10-CM | POA: Diagnosis not present

## 2014-06-10 DIAGNOSIS — M47817 Spondylosis without myelopathy or radiculopathy, lumbosacral region: Secondary | ICD-10-CM | POA: Diagnosis not present

## 2014-06-10 DIAGNOSIS — M19019 Primary osteoarthritis, unspecified shoulder: Secondary | ICD-10-CM | POA: Diagnosis not present

## 2014-06-10 DIAGNOSIS — M5416 Radiculopathy, lumbar region: Secondary | ICD-10-CM | POA: Diagnosis not present

## 2014-06-15 DIAGNOSIS — Z1389 Encounter for screening for other disorder: Secondary | ICD-10-CM | POA: Diagnosis not present

## 2014-06-15 DIAGNOSIS — K029 Dental caries, unspecified: Secondary | ICD-10-CM | POA: Diagnosis not present

## 2014-06-15 DIAGNOSIS — R600 Localized edema: Secondary | ICD-10-CM | POA: Diagnosis not present

## 2014-06-15 DIAGNOSIS — R21 Rash and other nonspecific skin eruption: Secondary | ICD-10-CM | POA: Diagnosis not present

## 2014-06-29 DIAGNOSIS — F438 Other reactions to severe stress: Secondary | ICD-10-CM | POA: Diagnosis not present

## 2014-06-29 DIAGNOSIS — F332 Major depressive disorder, recurrent severe without psychotic features: Secondary | ICD-10-CM | POA: Diagnosis not present

## 2014-06-30 DIAGNOSIS — F438 Other reactions to severe stress: Secondary | ICD-10-CM | POA: Diagnosis not present

## 2014-06-30 DIAGNOSIS — F332 Major depressive disorder, recurrent severe without psychotic features: Secondary | ICD-10-CM | POA: Diagnosis not present

## 2014-06-30 DIAGNOSIS — F439 Reaction to severe stress, unspecified: Secondary | ICD-10-CM | POA: Diagnosis not present

## 2014-07-02 ENCOUNTER — Emergency Department: Payer: Self-pay | Admitting: Emergency Medicine

## 2014-07-02 DIAGNOSIS — M25511 Pain in right shoulder: Secondary | ICD-10-CM | POA: Diagnosis not present

## 2014-07-02 DIAGNOSIS — Z0389 Encounter for observation for other suspected diseases and conditions ruled out: Secondary | ICD-10-CM | POA: Diagnosis not present

## 2014-07-02 DIAGNOSIS — S82821A Torus fracture of lower end of right fibula, initial encounter for closed fracture: Secondary | ICD-10-CM | POA: Diagnosis not present

## 2014-07-02 DIAGNOSIS — S82841A Displaced bimalleolar fracture of right lower leg, initial encounter for closed fracture: Secondary | ICD-10-CM | POA: Diagnosis not present

## 2014-07-02 DIAGNOSIS — S4991XA Unspecified injury of right shoulder and upper arm, initial encounter: Secondary | ICD-10-CM | POA: Diagnosis not present

## 2014-07-02 DIAGNOSIS — Z720 Tobacco use: Secondary | ICD-10-CM | POA: Diagnosis not present

## 2014-07-02 DIAGNOSIS — S82831A Other fracture of upper and lower end of right fibula, initial encounter for closed fracture: Secondary | ICD-10-CM | POA: Diagnosis not present

## 2014-07-02 DIAGNOSIS — Z79899 Other long term (current) drug therapy: Secondary | ICD-10-CM | POA: Diagnosis not present

## 2014-07-02 DIAGNOSIS — S82301A Unspecified fracture of lower end of right tibia, initial encounter for closed fracture: Secondary | ICD-10-CM | POA: Diagnosis not present

## 2014-07-02 LAB — COMPREHENSIVE METABOLIC PANEL
ANION GAP: 6 — AB (ref 7–16)
AST: 32 U/L (ref 15–37)
Albumin: 2.9 g/dL — ABNORMAL LOW (ref 3.4–5.0)
Alkaline Phosphatase: 174 U/L — ABNORMAL HIGH
BUN: 24 mg/dL — AB (ref 7–18)
Bilirubin,Total: 0.4 mg/dL (ref 0.2–1.0)
CALCIUM: 8.3 mg/dL — AB (ref 8.5–10.1)
CHLORIDE: 95 mmol/L — AB (ref 98–107)
Co2: 34 mmol/L — ABNORMAL HIGH (ref 21–32)
Creatinine: 1.1 mg/dL (ref 0.60–1.30)
EGFR (African American): >60
EGFR (Non-African Amer.): 52 — ABNORMAL LOW
GLUCOSE: 94 mg/dL (ref 65–99)
Osmolality: 274 (ref 275–301)
POTASSIUM: 4.3 mmol/L (ref 3.5–5.1)
SGPT (ALT): 21 U/L
SODIUM: 135 mmol/L — AB (ref 136–145)
Total Protein: 7 g/dL (ref 6.4–8.2)

## 2014-07-02 LAB — ETHANOL

## 2014-07-02 LAB — URINALYSIS, COMPLETE
BACTERIA: NONE SEEN
BILIRUBIN, UR: NEGATIVE
BLOOD: NEGATIVE
GLUCOSE, UR: NEGATIVE mg/dL (ref 0–75)
Ketone: NEGATIVE
LEUKOCYTE ESTERASE: NEGATIVE
Nitrite: NEGATIVE
PH: 5 (ref 4.5–8.0)
Protein: NEGATIVE
RBC,UR: NONE SEEN /HPF (ref 0–5)
SPECIFIC GRAVITY: 1.015 (ref 1.003–1.030)
Squamous Epithelial: NONE SEEN
WBC UR: 1 /HPF (ref 0–5)

## 2014-07-02 LAB — TROPONIN I

## 2014-07-02 LAB — CBC
HCT: 36.8 % (ref 35.0–47.0)
HGB: 12.1 g/dL (ref 12.0–16.0)
MCH: 33.8 pg (ref 26.0–34.0)
MCHC: 33 g/dL (ref 32.0–36.0)
MCV: 103 fL — AB (ref 80–100)
Platelet: 324 10*3/uL (ref 150–440)
RBC: 3.59 10*6/uL — ABNORMAL LOW (ref 3.80–5.20)
RDW: 13.4 % (ref 11.5–14.5)
WBC: 9 10*3/uL (ref 3.6–11.0)

## 2014-07-03 DIAGNOSIS — L989 Disorder of the skin and subcutaneous tissue, unspecified: Secondary | ICD-10-CM | POA: Diagnosis not present

## 2014-07-03 DIAGNOSIS — Z9181 History of falling: Secondary | ICD-10-CM | POA: Diagnosis not present

## 2014-07-03 DIAGNOSIS — E46 Unspecified protein-calorie malnutrition: Secondary | ICD-10-CM | POA: Diagnosis not present

## 2014-07-03 DIAGNOSIS — S89301D Unspecified physeal fracture of lower end of right fibula, subsequent encounter for fracture with routine healing: Secondary | ICD-10-CM | POA: Diagnosis not present

## 2014-07-03 DIAGNOSIS — J449 Chronic obstructive pulmonary disease, unspecified: Secondary | ICD-10-CM | POA: Diagnosis not present

## 2014-07-03 DIAGNOSIS — F419 Anxiety disorder, unspecified: Secondary | ICD-10-CM | POA: Diagnosis not present

## 2014-07-03 DIAGNOSIS — M329 Systemic lupus erythematosus, unspecified: Secondary | ICD-10-CM | POA: Diagnosis not present

## 2014-07-03 DIAGNOSIS — S89101D Unspecified physeal fracture of lower end of right tibia, subsequent encounter for fracture with routine healing: Secondary | ICD-10-CM | POA: Diagnosis not present

## 2014-07-05 ENCOUNTER — Ambulatory Visit: Payer: Self-pay | Admitting: Pain Medicine

## 2014-07-05 DIAGNOSIS — M79602 Pain in left arm: Secondary | ICD-10-CM | POA: Diagnosis not present

## 2014-07-05 DIAGNOSIS — L989 Disorder of the skin and subcutaneous tissue, unspecified: Secondary | ICD-10-CM | POA: Diagnosis not present

## 2014-07-05 DIAGNOSIS — M79601 Pain in right arm: Secondary | ICD-10-CM | POA: Diagnosis not present

## 2014-07-05 DIAGNOSIS — S89101D Unspecified physeal fracture of lower end of right tibia, subsequent encounter for fracture with routine healing: Secondary | ICD-10-CM | POA: Diagnosis not present

## 2014-07-05 DIAGNOSIS — J449 Chronic obstructive pulmonary disease, unspecified: Secondary | ICD-10-CM | POA: Diagnosis not present

## 2014-07-05 DIAGNOSIS — S89301D Unspecified physeal fracture of lower end of right fibula, subsequent encounter for fracture with routine healing: Secondary | ICD-10-CM | POA: Diagnosis not present

## 2014-07-05 DIAGNOSIS — E46 Unspecified protein-calorie malnutrition: Secondary | ICD-10-CM | POA: Diagnosis not present

## 2014-07-05 DIAGNOSIS — M329 Systemic lupus erythematosus, unspecified: Secondary | ICD-10-CM | POA: Diagnosis not present

## 2014-07-05 DIAGNOSIS — M47896 Other spondylosis, lumbar region: Secondary | ICD-10-CM | POA: Diagnosis not present

## 2014-07-05 DIAGNOSIS — M461 Sacroiliitis, not elsewhere classified: Secondary | ICD-10-CM | POA: Diagnosis not present

## 2014-07-05 DIAGNOSIS — M542 Cervicalgia: Secondary | ICD-10-CM | POA: Diagnosis not present

## 2014-07-06 DIAGNOSIS — S89101D Unspecified physeal fracture of lower end of right tibia, subsequent encounter for fracture with routine healing: Secondary | ICD-10-CM | POA: Diagnosis not present

## 2014-07-06 DIAGNOSIS — E46 Unspecified protein-calorie malnutrition: Secondary | ICD-10-CM | POA: Diagnosis not present

## 2014-07-06 DIAGNOSIS — M329 Systemic lupus erythematosus, unspecified: Secondary | ICD-10-CM | POA: Diagnosis not present

## 2014-07-06 DIAGNOSIS — J449 Chronic obstructive pulmonary disease, unspecified: Secondary | ICD-10-CM | POA: Diagnosis not present

## 2014-07-06 DIAGNOSIS — S89301D Unspecified physeal fracture of lower end of right fibula, subsequent encounter for fracture with routine healing: Secondary | ICD-10-CM | POA: Diagnosis not present

## 2014-07-06 DIAGNOSIS — L989 Disorder of the skin and subcutaneous tissue, unspecified: Secondary | ICD-10-CM | POA: Diagnosis not present

## 2014-07-07 DIAGNOSIS — S89101D Unspecified physeal fracture of lower end of right tibia, subsequent encounter for fracture with routine healing: Secondary | ICD-10-CM | POA: Diagnosis not present

## 2014-07-07 DIAGNOSIS — E46 Unspecified protein-calorie malnutrition: Secondary | ICD-10-CM | POA: Diagnosis not present

## 2014-07-07 DIAGNOSIS — L989 Disorder of the skin and subcutaneous tissue, unspecified: Secondary | ICD-10-CM | POA: Diagnosis not present

## 2014-07-07 DIAGNOSIS — S89301D Unspecified physeal fracture of lower end of right fibula, subsequent encounter for fracture with routine healing: Secondary | ICD-10-CM | POA: Diagnosis not present

## 2014-07-07 DIAGNOSIS — J449 Chronic obstructive pulmonary disease, unspecified: Secondary | ICD-10-CM | POA: Diagnosis not present

## 2014-07-07 DIAGNOSIS — M329 Systemic lupus erythematosus, unspecified: Secondary | ICD-10-CM | POA: Diagnosis not present

## 2014-07-08 DIAGNOSIS — F439 Reaction to severe stress, unspecified: Secondary | ICD-10-CM | POA: Diagnosis not present

## 2014-07-08 DIAGNOSIS — F438 Other reactions to severe stress: Secondary | ICD-10-CM | POA: Diagnosis not present

## 2014-07-08 DIAGNOSIS — F332 Major depressive disorder, recurrent severe without psychotic features: Secondary | ICD-10-CM | POA: Diagnosis not present

## 2014-07-09 DIAGNOSIS — L989 Disorder of the skin and subcutaneous tissue, unspecified: Secondary | ICD-10-CM | POA: Diagnosis not present

## 2014-07-09 DIAGNOSIS — S89101D Unspecified physeal fracture of lower end of right tibia, subsequent encounter for fracture with routine healing: Secondary | ICD-10-CM | POA: Diagnosis not present

## 2014-07-09 DIAGNOSIS — M329 Systemic lupus erythematosus, unspecified: Secondary | ICD-10-CM | POA: Diagnosis not present

## 2014-07-09 DIAGNOSIS — J449 Chronic obstructive pulmonary disease, unspecified: Secondary | ICD-10-CM | POA: Diagnosis not present

## 2014-07-09 DIAGNOSIS — S89301D Unspecified physeal fracture of lower end of right fibula, subsequent encounter for fracture with routine healing: Secondary | ICD-10-CM | POA: Diagnosis not present

## 2014-07-09 DIAGNOSIS — E46 Unspecified protein-calorie malnutrition: Secondary | ICD-10-CM | POA: Diagnosis not present

## 2014-07-10 DIAGNOSIS — E46 Unspecified protein-calorie malnutrition: Secondary | ICD-10-CM | POA: Diagnosis not present

## 2014-07-10 DIAGNOSIS — M329 Systemic lupus erythematosus, unspecified: Secondary | ICD-10-CM | POA: Diagnosis not present

## 2014-07-10 DIAGNOSIS — S89101D Unspecified physeal fracture of lower end of right tibia, subsequent encounter for fracture with routine healing: Secondary | ICD-10-CM | POA: Diagnosis not present

## 2014-07-10 DIAGNOSIS — J449 Chronic obstructive pulmonary disease, unspecified: Secondary | ICD-10-CM | POA: Diagnosis not present

## 2014-07-10 DIAGNOSIS — L989 Disorder of the skin and subcutaneous tissue, unspecified: Secondary | ICD-10-CM | POA: Diagnosis not present

## 2014-07-10 DIAGNOSIS — S89301D Unspecified physeal fracture of lower end of right fibula, subsequent encounter for fracture with routine healing: Secondary | ICD-10-CM | POA: Diagnosis not present

## 2014-07-13 DIAGNOSIS — L989 Disorder of the skin and subcutaneous tissue, unspecified: Secondary | ICD-10-CM | POA: Diagnosis not present

## 2014-07-13 DIAGNOSIS — E46 Unspecified protein-calorie malnutrition: Secondary | ICD-10-CM | POA: Diagnosis not present

## 2014-07-13 DIAGNOSIS — S89301D Unspecified physeal fracture of lower end of right fibula, subsequent encounter for fracture with routine healing: Secondary | ICD-10-CM | POA: Diagnosis not present

## 2014-07-13 DIAGNOSIS — M329 Systemic lupus erythematosus, unspecified: Secondary | ICD-10-CM | POA: Diagnosis not present

## 2014-07-13 DIAGNOSIS — J449 Chronic obstructive pulmonary disease, unspecified: Secondary | ICD-10-CM | POA: Diagnosis not present

## 2014-07-13 DIAGNOSIS — S89101D Unspecified physeal fracture of lower end of right tibia, subsequent encounter for fracture with routine healing: Secondary | ICD-10-CM | POA: Diagnosis not present

## 2014-07-14 DIAGNOSIS — S89301D Unspecified physeal fracture of lower end of right fibula, subsequent encounter for fracture with routine healing: Secondary | ICD-10-CM | POA: Diagnosis not present

## 2014-07-14 DIAGNOSIS — M79602 Pain in left arm: Secondary | ICD-10-CM | POA: Diagnosis not present

## 2014-07-14 DIAGNOSIS — M25519 Pain in unspecified shoulder: Secondary | ICD-10-CM | POA: Insufficient documentation

## 2014-07-14 DIAGNOSIS — R413 Other amnesia: Secondary | ICD-10-CM | POA: Diagnosis not present

## 2014-07-14 DIAGNOSIS — E46 Unspecified protein-calorie malnutrition: Secondary | ICD-10-CM | POA: Diagnosis not present

## 2014-07-14 DIAGNOSIS — R200 Anesthesia of skin: Secondary | ICD-10-CM | POA: Diagnosis not present

## 2014-07-14 DIAGNOSIS — M329 Systemic lupus erythematosus, unspecified: Secondary | ICD-10-CM | POA: Diagnosis not present

## 2014-07-14 DIAGNOSIS — R569 Unspecified convulsions: Secondary | ICD-10-CM | POA: Diagnosis not present

## 2014-07-14 DIAGNOSIS — L989 Disorder of the skin and subcutaneous tissue, unspecified: Secondary | ICD-10-CM | POA: Diagnosis not present

## 2014-07-14 DIAGNOSIS — S89101D Unspecified physeal fracture of lower end of right tibia, subsequent encounter for fracture with routine healing: Secondary | ICD-10-CM | POA: Diagnosis not present

## 2014-07-14 DIAGNOSIS — J449 Chronic obstructive pulmonary disease, unspecified: Secondary | ICD-10-CM | POA: Diagnosis not present

## 2014-07-15 DIAGNOSIS — L989 Disorder of the skin and subcutaneous tissue, unspecified: Secondary | ICD-10-CM | POA: Diagnosis not present

## 2014-07-15 DIAGNOSIS — M329 Systemic lupus erythematosus, unspecified: Secondary | ICD-10-CM | POA: Diagnosis not present

## 2014-07-15 DIAGNOSIS — M25571 Pain in right ankle and joints of right foot: Secondary | ICD-10-CM | POA: Diagnosis not present

## 2014-07-15 DIAGNOSIS — S89101D Unspecified physeal fracture of lower end of right tibia, subsequent encounter for fracture with routine healing: Secondary | ICD-10-CM | POA: Diagnosis not present

## 2014-07-15 DIAGNOSIS — E46 Unspecified protein-calorie malnutrition: Secondary | ICD-10-CM | POA: Diagnosis not present

## 2014-07-15 DIAGNOSIS — J449 Chronic obstructive pulmonary disease, unspecified: Secondary | ICD-10-CM | POA: Diagnosis not present

## 2014-07-15 DIAGNOSIS — S89301D Unspecified physeal fracture of lower end of right fibula, subsequent encounter for fracture with routine healing: Secondary | ICD-10-CM | POA: Diagnosis not present

## 2014-07-19 DIAGNOSIS — M25571 Pain in right ankle and joints of right foot: Secondary | ICD-10-CM | POA: Diagnosis not present

## 2014-07-19 DIAGNOSIS — S43421A Sprain of right rotator cuff capsule, initial encounter: Secondary | ICD-10-CM | POA: Diagnosis not present

## 2014-07-20 DIAGNOSIS — S89101D Unspecified physeal fracture of lower end of right tibia, subsequent encounter for fracture with routine healing: Secondary | ICD-10-CM | POA: Diagnosis not present

## 2014-07-20 DIAGNOSIS — L989 Disorder of the skin and subcutaneous tissue, unspecified: Secondary | ICD-10-CM | POA: Diagnosis not present

## 2014-07-20 DIAGNOSIS — S89301D Unspecified physeal fracture of lower end of right fibula, subsequent encounter for fracture with routine healing: Secondary | ICD-10-CM | POA: Diagnosis not present

## 2014-07-20 DIAGNOSIS — J449 Chronic obstructive pulmonary disease, unspecified: Secondary | ICD-10-CM | POA: Diagnosis not present

## 2014-07-20 DIAGNOSIS — E46 Unspecified protein-calorie malnutrition: Secondary | ICD-10-CM | POA: Diagnosis not present

## 2014-07-20 DIAGNOSIS — M329 Systemic lupus erythematosus, unspecified: Secondary | ICD-10-CM | POA: Diagnosis not present

## 2014-07-21 DIAGNOSIS — J449 Chronic obstructive pulmonary disease, unspecified: Secondary | ICD-10-CM | POA: Diagnosis not present

## 2014-07-21 DIAGNOSIS — S89301D Unspecified physeal fracture of lower end of right fibula, subsequent encounter for fracture with routine healing: Secondary | ICD-10-CM | POA: Diagnosis not present

## 2014-07-21 DIAGNOSIS — M329 Systemic lupus erythematosus, unspecified: Secondary | ICD-10-CM | POA: Diagnosis not present

## 2014-07-21 DIAGNOSIS — S89101D Unspecified physeal fracture of lower end of right tibia, subsequent encounter for fracture with routine healing: Secondary | ICD-10-CM | POA: Diagnosis not present

## 2014-07-21 DIAGNOSIS — E46 Unspecified protein-calorie malnutrition: Secondary | ICD-10-CM | POA: Diagnosis not present

## 2014-07-21 DIAGNOSIS — L989 Disorder of the skin and subcutaneous tissue, unspecified: Secondary | ICD-10-CM | POA: Diagnosis not present

## 2014-07-22 DIAGNOSIS — M329 Systemic lupus erythematosus, unspecified: Secondary | ICD-10-CM | POA: Diagnosis not present

## 2014-07-22 DIAGNOSIS — E46 Unspecified protein-calorie malnutrition: Secondary | ICD-10-CM | POA: Diagnosis not present

## 2014-07-22 DIAGNOSIS — S89301D Unspecified physeal fracture of lower end of right fibula, subsequent encounter for fracture with routine healing: Secondary | ICD-10-CM | POA: Diagnosis not present

## 2014-07-22 DIAGNOSIS — S89101D Unspecified physeal fracture of lower end of right tibia, subsequent encounter for fracture with routine healing: Secondary | ICD-10-CM | POA: Diagnosis not present

## 2014-07-22 DIAGNOSIS — L989 Disorder of the skin and subcutaneous tissue, unspecified: Secondary | ICD-10-CM | POA: Diagnosis not present

## 2014-07-22 DIAGNOSIS — J449 Chronic obstructive pulmonary disease, unspecified: Secondary | ICD-10-CM | POA: Diagnosis not present

## 2014-07-27 DIAGNOSIS — S89301D Unspecified physeal fracture of lower end of right fibula, subsequent encounter for fracture with routine healing: Secondary | ICD-10-CM | POA: Diagnosis not present

## 2014-07-27 DIAGNOSIS — M329 Systemic lupus erythematosus, unspecified: Secondary | ICD-10-CM | POA: Diagnosis not present

## 2014-07-27 DIAGNOSIS — L989 Disorder of the skin and subcutaneous tissue, unspecified: Secondary | ICD-10-CM | POA: Diagnosis not present

## 2014-07-27 DIAGNOSIS — S89101D Unspecified physeal fracture of lower end of right tibia, subsequent encounter for fracture with routine healing: Secondary | ICD-10-CM | POA: Diagnosis not present

## 2014-07-27 DIAGNOSIS — J449 Chronic obstructive pulmonary disease, unspecified: Secondary | ICD-10-CM | POA: Diagnosis not present

## 2014-07-27 DIAGNOSIS — E46 Unspecified protein-calorie malnutrition: Secondary | ICD-10-CM | POA: Diagnosis not present

## 2014-07-28 ENCOUNTER — Ambulatory Visit: Payer: Self-pay | Admitting: Orthopedic Surgery

## 2014-07-28 DIAGNOSIS — S4382XA Sprain of other specified parts of left shoulder girdle, initial encounter: Secondary | ICD-10-CM | POA: Diagnosis not present

## 2014-07-28 DIAGNOSIS — M7581 Other shoulder lesions, right shoulder: Secondary | ICD-10-CM | POA: Diagnosis not present

## 2014-07-28 DIAGNOSIS — S46811A Strain of other muscles, fascia and tendons at shoulder and upper arm level, right arm, initial encounter: Secondary | ICD-10-CM | POA: Diagnosis not present

## 2014-07-28 DIAGNOSIS — M25511 Pain in right shoulder: Secondary | ICD-10-CM | POA: Diagnosis not present

## 2014-07-28 DIAGNOSIS — M24511 Contracture, right shoulder: Secondary | ICD-10-CM | POA: Diagnosis not present

## 2014-07-29 DIAGNOSIS — S89101D Unspecified physeal fracture of lower end of right tibia, subsequent encounter for fracture with routine healing: Secondary | ICD-10-CM | POA: Diagnosis not present

## 2014-07-29 DIAGNOSIS — M329 Systemic lupus erythematosus, unspecified: Secondary | ICD-10-CM | POA: Diagnosis not present

## 2014-07-29 DIAGNOSIS — S89301D Unspecified physeal fracture of lower end of right fibula, subsequent encounter for fracture with routine healing: Secondary | ICD-10-CM | POA: Diagnosis not present

## 2014-07-29 DIAGNOSIS — L989 Disorder of the skin and subcutaneous tissue, unspecified: Secondary | ICD-10-CM | POA: Diagnosis not present

## 2014-07-29 DIAGNOSIS — J449 Chronic obstructive pulmonary disease, unspecified: Secondary | ICD-10-CM | POA: Diagnosis not present

## 2014-07-29 DIAGNOSIS — E46 Unspecified protein-calorie malnutrition: Secondary | ICD-10-CM | POA: Diagnosis not present

## 2014-08-04 DIAGNOSIS — S89301D Unspecified physeal fracture of lower end of right fibula, subsequent encounter for fracture with routine healing: Secondary | ICD-10-CM | POA: Diagnosis not present

## 2014-08-04 DIAGNOSIS — J449 Chronic obstructive pulmonary disease, unspecified: Secondary | ICD-10-CM | POA: Diagnosis not present

## 2014-08-04 DIAGNOSIS — M329 Systemic lupus erythematosus, unspecified: Secondary | ICD-10-CM | POA: Diagnosis not present

## 2014-08-04 DIAGNOSIS — L989 Disorder of the skin and subcutaneous tissue, unspecified: Secondary | ICD-10-CM | POA: Diagnosis not present

## 2014-08-04 DIAGNOSIS — S89101D Unspecified physeal fracture of lower end of right tibia, subsequent encounter for fracture with routine healing: Secondary | ICD-10-CM | POA: Diagnosis not present

## 2014-08-04 DIAGNOSIS — E46 Unspecified protein-calorie malnutrition: Secondary | ICD-10-CM | POA: Diagnosis not present

## 2014-08-05 DIAGNOSIS — M328 Other forms of systemic lupus erythematosus: Secondary | ICD-10-CM | POA: Diagnosis not present

## 2014-08-05 DIAGNOSIS — L03119 Cellulitis of unspecified part of limb: Secondary | ICD-10-CM | POA: Diagnosis not present

## 2014-08-05 DIAGNOSIS — B3789 Other sites of candidiasis: Secondary | ICD-10-CM | POA: Diagnosis not present

## 2014-08-05 DIAGNOSIS — R21 Rash and other nonspecific skin eruption: Secondary | ICD-10-CM | POA: Diagnosis not present

## 2014-08-09 DIAGNOSIS — M75121 Complete rotator cuff tear or rupture of right shoulder, not specified as traumatic: Secondary | ICD-10-CM | POA: Diagnosis not present

## 2014-08-09 DIAGNOSIS — M65811 Other synovitis and tenosynovitis, right shoulder: Secondary | ICD-10-CM | POA: Diagnosis not present

## 2014-08-09 DIAGNOSIS — M75122 Complete rotator cuff tear or rupture of left shoulder, not specified as traumatic: Secondary | ICD-10-CM | POA: Insufficient documentation

## 2014-08-16 ENCOUNTER — Ambulatory Visit: Payer: Self-pay | Admitting: Surgery

## 2014-08-19 ENCOUNTER — Inpatient Hospital Stay: Payer: Self-pay | Admitting: Surgery

## 2014-08-19 DIAGNOSIS — D62 Acute posthemorrhagic anemia: Secondary | ICD-10-CM | POA: Diagnosis not present

## 2014-08-19 DIAGNOSIS — R42 Dizziness and giddiness: Secondary | ICD-10-CM | POA: Diagnosis not present

## 2014-08-19 DIAGNOSIS — M75101 Unspecified rotator cuff tear or rupture of right shoulder, not specified as traumatic: Secondary | ICD-10-CM | POA: Diagnosis present

## 2014-08-19 DIAGNOSIS — M1990 Unspecified osteoarthritis, unspecified site: Secondary | ICD-10-CM | POA: Diagnosis present

## 2014-08-19 DIAGNOSIS — M19011 Primary osteoarthritis, right shoulder: Secondary | ICD-10-CM | POA: Diagnosis not present

## 2014-08-19 DIAGNOSIS — M75121 Complete rotator cuff tear or rupture of right shoulder, not specified as traumatic: Secondary | ICD-10-CM | POA: Diagnosis not present

## 2014-08-19 DIAGNOSIS — M65811 Other synovitis and tenosynovitis, right shoulder: Secondary | ICD-10-CM | POA: Diagnosis not present

## 2014-08-23 DIAGNOSIS — S89301D Unspecified physeal fracture of lower end of right fibula, subsequent encounter for fracture with routine healing: Secondary | ICD-10-CM | POA: Diagnosis not present

## 2014-08-23 DIAGNOSIS — M329 Systemic lupus erythematosus, unspecified: Secondary | ICD-10-CM | POA: Diagnosis not present

## 2014-08-23 DIAGNOSIS — J449 Chronic obstructive pulmonary disease, unspecified: Secondary | ICD-10-CM | POA: Diagnosis not present

## 2014-08-23 DIAGNOSIS — E46 Unspecified protein-calorie malnutrition: Secondary | ICD-10-CM | POA: Diagnosis not present

## 2014-08-23 DIAGNOSIS — S89101D Unspecified physeal fracture of lower end of right tibia, subsequent encounter for fracture with routine healing: Secondary | ICD-10-CM | POA: Diagnosis not present

## 2014-08-23 DIAGNOSIS — L989 Disorder of the skin and subcutaneous tissue, unspecified: Secondary | ICD-10-CM | POA: Diagnosis not present

## 2014-08-24 DIAGNOSIS — L989 Disorder of the skin and subcutaneous tissue, unspecified: Secondary | ICD-10-CM | POA: Diagnosis not present

## 2014-08-24 DIAGNOSIS — E46 Unspecified protein-calorie malnutrition: Secondary | ICD-10-CM | POA: Diagnosis not present

## 2014-08-24 DIAGNOSIS — M329 Systemic lupus erythematosus, unspecified: Secondary | ICD-10-CM | POA: Diagnosis not present

## 2014-08-24 DIAGNOSIS — J449 Chronic obstructive pulmonary disease, unspecified: Secondary | ICD-10-CM | POA: Diagnosis not present

## 2014-08-24 DIAGNOSIS — S89301D Unspecified physeal fracture of lower end of right fibula, subsequent encounter for fracture with routine healing: Secondary | ICD-10-CM | POA: Diagnosis not present

## 2014-08-24 DIAGNOSIS — S89101D Unspecified physeal fracture of lower end of right tibia, subsequent encounter for fracture with routine healing: Secondary | ICD-10-CM | POA: Diagnosis not present

## 2014-08-26 DIAGNOSIS — L989 Disorder of the skin and subcutaneous tissue, unspecified: Secondary | ICD-10-CM | POA: Diagnosis not present

## 2014-08-26 DIAGNOSIS — M329 Systemic lupus erythematosus, unspecified: Secondary | ICD-10-CM | POA: Diagnosis not present

## 2014-08-26 DIAGNOSIS — E46 Unspecified protein-calorie malnutrition: Secondary | ICD-10-CM | POA: Diagnosis not present

## 2014-08-26 DIAGNOSIS — S89301D Unspecified physeal fracture of lower end of right fibula, subsequent encounter for fracture with routine healing: Secondary | ICD-10-CM | POA: Diagnosis not present

## 2014-08-26 DIAGNOSIS — S89101D Unspecified physeal fracture of lower end of right tibia, subsequent encounter for fracture with routine healing: Secondary | ICD-10-CM | POA: Diagnosis not present

## 2014-08-26 DIAGNOSIS — J449 Chronic obstructive pulmonary disease, unspecified: Secondary | ICD-10-CM | POA: Diagnosis not present

## 2014-08-30 DIAGNOSIS — S89101D Unspecified physeal fracture of lower end of right tibia, subsequent encounter for fracture with routine healing: Secondary | ICD-10-CM | POA: Diagnosis not present

## 2014-08-30 DIAGNOSIS — S89301D Unspecified physeal fracture of lower end of right fibula, subsequent encounter for fracture with routine healing: Secondary | ICD-10-CM | POA: Diagnosis not present

## 2014-08-30 DIAGNOSIS — M329 Systemic lupus erythematosus, unspecified: Secondary | ICD-10-CM | POA: Diagnosis not present

## 2014-08-30 DIAGNOSIS — L989 Disorder of the skin and subcutaneous tissue, unspecified: Secondary | ICD-10-CM | POA: Diagnosis not present

## 2014-08-30 DIAGNOSIS — J449 Chronic obstructive pulmonary disease, unspecified: Secondary | ICD-10-CM | POA: Diagnosis not present

## 2014-08-30 DIAGNOSIS — E46 Unspecified protein-calorie malnutrition: Secondary | ICD-10-CM | POA: Diagnosis not present

## 2014-08-31 DIAGNOSIS — S89101D Unspecified physeal fracture of lower end of right tibia, subsequent encounter for fracture with routine healing: Secondary | ICD-10-CM | POA: Diagnosis not present

## 2014-08-31 DIAGNOSIS — J449 Chronic obstructive pulmonary disease, unspecified: Secondary | ICD-10-CM | POA: Diagnosis not present

## 2014-08-31 DIAGNOSIS — L989 Disorder of the skin and subcutaneous tissue, unspecified: Secondary | ICD-10-CM | POA: Diagnosis not present

## 2014-08-31 DIAGNOSIS — E46 Unspecified protein-calorie malnutrition: Secondary | ICD-10-CM | POA: Diagnosis not present

## 2014-08-31 DIAGNOSIS — S89301D Unspecified physeal fracture of lower end of right fibula, subsequent encounter for fracture with routine healing: Secondary | ICD-10-CM | POA: Diagnosis not present

## 2014-08-31 DIAGNOSIS — M329 Systemic lupus erythematosus, unspecified: Secondary | ICD-10-CM | POA: Diagnosis not present

## 2014-09-01 DIAGNOSIS — M329 Systemic lupus erythematosus, unspecified: Secondary | ICD-10-CM | POA: Diagnosis not present

## 2014-09-01 DIAGNOSIS — S89101D Unspecified physeal fracture of lower end of right tibia, subsequent encounter for fracture with routine healing: Secondary | ICD-10-CM | POA: Diagnosis not present

## 2014-09-01 DIAGNOSIS — Z9181 History of falling: Secondary | ICD-10-CM | POA: Diagnosis not present

## 2014-09-01 DIAGNOSIS — R262 Difficulty in walking, not elsewhere classified: Secondary | ICD-10-CM | POA: Diagnosis not present

## 2014-09-01 DIAGNOSIS — E46 Unspecified protein-calorie malnutrition: Secondary | ICD-10-CM | POA: Diagnosis not present

## 2014-09-01 DIAGNOSIS — Z96611 Presence of right artificial shoulder joint: Secondary | ICD-10-CM | POA: Diagnosis not present

## 2014-09-01 DIAGNOSIS — Z471 Aftercare following joint replacement surgery: Secondary | ICD-10-CM | POA: Diagnosis not present

## 2014-09-01 DIAGNOSIS — J449 Chronic obstructive pulmonary disease, unspecified: Secondary | ICD-10-CM | POA: Diagnosis not present

## 2014-09-01 DIAGNOSIS — L989 Disorder of the skin and subcutaneous tissue, unspecified: Secondary | ICD-10-CM | POA: Diagnosis not present

## 2014-09-01 DIAGNOSIS — S89301D Unspecified physeal fracture of lower end of right fibula, subsequent encounter for fracture with routine healing: Secondary | ICD-10-CM | POA: Diagnosis not present

## 2014-09-01 DIAGNOSIS — F419 Anxiety disorder, unspecified: Secondary | ICD-10-CM | POA: Diagnosis not present

## 2014-09-03 DIAGNOSIS — J449 Chronic obstructive pulmonary disease, unspecified: Secondary | ICD-10-CM | POA: Diagnosis not present

## 2014-09-03 DIAGNOSIS — M329 Systemic lupus erythematosus, unspecified: Secondary | ICD-10-CM | POA: Diagnosis not present

## 2014-09-03 DIAGNOSIS — Z96611 Presence of right artificial shoulder joint: Secondary | ICD-10-CM | POA: Diagnosis not present

## 2014-09-03 DIAGNOSIS — S89101D Unspecified physeal fracture of lower end of right tibia, subsequent encounter for fracture with routine healing: Secondary | ICD-10-CM | POA: Diagnosis not present

## 2014-09-03 DIAGNOSIS — R262 Difficulty in walking, not elsewhere classified: Secondary | ICD-10-CM | POA: Diagnosis not present

## 2014-09-03 DIAGNOSIS — Z471 Aftercare following joint replacement surgery: Secondary | ICD-10-CM | POA: Diagnosis not present

## 2014-09-06 DIAGNOSIS — Z471 Aftercare following joint replacement surgery: Secondary | ICD-10-CM | POA: Diagnosis not present

## 2014-09-06 DIAGNOSIS — S89101D Unspecified physeal fracture of lower end of right tibia, subsequent encounter for fracture with routine healing: Secondary | ICD-10-CM | POA: Diagnosis not present

## 2014-09-06 DIAGNOSIS — J449 Chronic obstructive pulmonary disease, unspecified: Secondary | ICD-10-CM | POA: Diagnosis not present

## 2014-09-06 DIAGNOSIS — M329 Systemic lupus erythematosus, unspecified: Secondary | ICD-10-CM | POA: Diagnosis not present

## 2014-09-06 DIAGNOSIS — R262 Difficulty in walking, not elsewhere classified: Secondary | ICD-10-CM | POA: Diagnosis not present

## 2014-09-06 DIAGNOSIS — Z96611 Presence of right artificial shoulder joint: Secondary | ICD-10-CM | POA: Diagnosis not present

## 2014-09-07 DIAGNOSIS — M329 Systemic lupus erythematosus, unspecified: Secondary | ICD-10-CM | POA: Diagnosis not present

## 2014-09-07 DIAGNOSIS — Z96611 Presence of right artificial shoulder joint: Secondary | ICD-10-CM | POA: Diagnosis not present

## 2014-09-07 DIAGNOSIS — R262 Difficulty in walking, not elsewhere classified: Secondary | ICD-10-CM | POA: Diagnosis not present

## 2014-09-07 DIAGNOSIS — J449 Chronic obstructive pulmonary disease, unspecified: Secondary | ICD-10-CM | POA: Diagnosis not present

## 2014-09-07 DIAGNOSIS — S89101D Unspecified physeal fracture of lower end of right tibia, subsequent encounter for fracture with routine healing: Secondary | ICD-10-CM | POA: Diagnosis not present

## 2014-09-07 DIAGNOSIS — Z471 Aftercare following joint replacement surgery: Secondary | ICD-10-CM | POA: Diagnosis not present

## 2014-09-09 DIAGNOSIS — Z471 Aftercare following joint replacement surgery: Secondary | ICD-10-CM | POA: Diagnosis not present

## 2014-09-09 DIAGNOSIS — J449 Chronic obstructive pulmonary disease, unspecified: Secondary | ICD-10-CM | POA: Diagnosis not present

## 2014-09-09 DIAGNOSIS — M1990 Unspecified osteoarthritis, unspecified site: Secondary | ICD-10-CM | POA: Diagnosis not present

## 2014-09-09 DIAGNOSIS — Z96611 Presence of right artificial shoulder joint: Secondary | ICD-10-CM | POA: Diagnosis not present

## 2014-09-09 DIAGNOSIS — I739 Peripheral vascular disease, unspecified: Secondary | ICD-10-CM | POA: Diagnosis not present

## 2014-09-09 DIAGNOSIS — M329 Systemic lupus erythematosus, unspecified: Secondary | ICD-10-CM | POA: Diagnosis not present

## 2014-09-09 DIAGNOSIS — E785 Hyperlipidemia, unspecified: Secondary | ICD-10-CM | POA: Diagnosis not present

## 2014-09-09 DIAGNOSIS — S89101D Unspecified physeal fracture of lower end of right tibia, subsequent encounter for fracture with routine healing: Secondary | ICD-10-CM | POA: Diagnosis not present

## 2014-09-09 DIAGNOSIS — M79609 Pain in unspecified limb: Secondary | ICD-10-CM | POA: Diagnosis not present

## 2014-09-09 DIAGNOSIS — R262 Difficulty in walking, not elsewhere classified: Secondary | ICD-10-CM | POA: Diagnosis not present

## 2014-09-10 ENCOUNTER — Emergency Department: Payer: Self-pay | Admitting: Emergency Medicine

## 2014-09-10 DIAGNOSIS — F319 Bipolar disorder, unspecified: Secondary | ICD-10-CM | POA: Diagnosis not present

## 2014-09-10 DIAGNOSIS — M75110 Incomplete rotator cuff tear or rupture of unspecified shoulder, not specified as traumatic: Secondary | ICD-10-CM | POA: Diagnosis not present

## 2014-09-10 DIAGNOSIS — M1990 Unspecified osteoarthritis, unspecified site: Secondary | ICD-10-CM | POA: Diagnosis not present

## 2014-09-10 DIAGNOSIS — K219 Gastro-esophageal reflux disease without esophagitis: Secondary | ICD-10-CM | POA: Diagnosis not present

## 2014-09-10 DIAGNOSIS — N390 Urinary tract infection, site not specified: Secondary | ICD-10-CM | POA: Diagnosis not present

## 2014-09-10 DIAGNOSIS — I890 Lymphedema, not elsewhere classified: Secondary | ICD-10-CM | POA: Diagnosis not present

## 2014-09-10 DIAGNOSIS — F22 Delusional disorders: Secondary | ICD-10-CM | POA: Diagnosis not present

## 2014-09-10 DIAGNOSIS — F328 Other depressive episodes: Secondary | ICD-10-CM | POA: Diagnosis not present

## 2014-09-10 DIAGNOSIS — I10 Essential (primary) hypertension: Secondary | ICD-10-CM | POA: Diagnosis not present

## 2014-09-10 DIAGNOSIS — G40009 Localization-related (focal) (partial) idiopathic epilepsy and epileptic syndromes with seizures of localized onset, not intractable, without status epilepticus: Secondary | ICD-10-CM | POA: Diagnosis not present

## 2014-09-10 DIAGNOSIS — M7989 Other specified soft tissue disorders: Secondary | ICD-10-CM | POA: Diagnosis not present

## 2014-09-10 DIAGNOSIS — E785 Hyperlipidemia, unspecified: Secondary | ICD-10-CM | POA: Diagnosis not present

## 2014-09-10 DIAGNOSIS — F411 Generalized anxiety disorder: Secondary | ICD-10-CM | POA: Diagnosis not present

## 2014-09-10 DIAGNOSIS — M79609 Pain in unspecified limb: Secondary | ICD-10-CM | POA: Diagnosis not present

## 2014-09-10 DIAGNOSIS — M3210 Systemic lupus erythematosus, organ or system involvement unspecified: Secondary | ICD-10-CM | POA: Diagnosis not present

## 2014-09-10 DIAGNOSIS — B86 Scabies: Secondary | ICD-10-CM | POA: Diagnosis not present

## 2014-09-10 DIAGNOSIS — I739 Peripheral vascular disease, unspecified: Secondary | ICD-10-CM | POA: Diagnosis not present

## 2014-09-10 DIAGNOSIS — M6281 Muscle weakness (generalized): Secondary | ICD-10-CM | POA: Diagnosis not present

## 2014-09-10 DIAGNOSIS — R531 Weakness: Secondary | ICD-10-CM | POA: Diagnosis not present

## 2014-09-10 DIAGNOSIS — R1031 Right lower quadrant pain: Secondary | ICD-10-CM | POA: Diagnosis not present

## 2014-09-10 DIAGNOSIS — F329 Major depressive disorder, single episode, unspecified: Secondary | ICD-10-CM | POA: Diagnosis not present

## 2014-09-10 DIAGNOSIS — L509 Urticaria, unspecified: Secondary | ICD-10-CM | POA: Diagnosis not present

## 2014-09-10 DIAGNOSIS — F17200 Nicotine dependence, unspecified, uncomplicated: Secondary | ICD-10-CM | POA: Diagnosis not present

## 2014-09-10 DIAGNOSIS — Z5189 Encounter for other specified aftercare: Secondary | ICD-10-CM | POA: Diagnosis not present

## 2014-09-10 DIAGNOSIS — M25519 Pain in unspecified shoulder: Secondary | ICD-10-CM | POA: Diagnosis not present

## 2014-09-10 DIAGNOSIS — G2581 Restless legs syndrome: Secondary | ICD-10-CM | POA: Diagnosis not present

## 2014-09-14 DIAGNOSIS — K219 Gastro-esophageal reflux disease without esophagitis: Secondary | ICD-10-CM | POA: Diagnosis not present

## 2014-09-14 DIAGNOSIS — F17200 Nicotine dependence, unspecified, uncomplicated: Secondary | ICD-10-CM | POA: Diagnosis not present

## 2014-09-14 DIAGNOSIS — I10 Essential (primary) hypertension: Secondary | ICD-10-CM | POA: Diagnosis not present

## 2014-09-14 DIAGNOSIS — F329 Major depressive disorder, single episode, unspecified: Secondary | ICD-10-CM | POA: Diagnosis not present

## 2014-09-14 DIAGNOSIS — M25519 Pain in unspecified shoulder: Secondary | ICD-10-CM | POA: Diagnosis not present

## 2014-09-14 DIAGNOSIS — F319 Bipolar disorder, unspecified: Secondary | ICD-10-CM | POA: Diagnosis not present

## 2014-09-16 ENCOUNTER — Other Ambulatory Visit: Payer: Self-pay | Admitting: Family Medicine

## 2014-09-16 LAB — URINALYSIS, COMPLETE
BILIRUBIN, UR: NEGATIVE
Blood: NEGATIVE
Glucose,UR: NEGATIVE mg/dL (ref 0–75)
KETONE: NEGATIVE
Leukocyte Esterase: NEGATIVE
Nitrite: NEGATIVE
PH: 5 (ref 4.5–8.0)
PROTEIN: NEGATIVE
RBC,UR: 1 /HPF (ref 0–5)
Specific Gravity: 1.026 (ref 1.003–1.030)
WBC UR: 5 /HPF (ref 0–5)

## 2014-09-17 LAB — URINE CULTURE

## 2014-09-21 DIAGNOSIS — F17200 Nicotine dependence, unspecified, uncomplicated: Secondary | ICD-10-CM | POA: Diagnosis not present

## 2014-09-21 DIAGNOSIS — F319 Bipolar disorder, unspecified: Secondary | ICD-10-CM | POA: Diagnosis not present

## 2014-09-23 DIAGNOSIS — I890 Lymphedema, not elsewhere classified: Secondary | ICD-10-CM | POA: Diagnosis not present

## 2014-09-23 DIAGNOSIS — M7989 Other specified soft tissue disorders: Secondary | ICD-10-CM | POA: Diagnosis not present

## 2014-09-23 DIAGNOSIS — R1031 Right lower quadrant pain: Secondary | ICD-10-CM | POA: Diagnosis not present

## 2014-09-23 DIAGNOSIS — I739 Peripheral vascular disease, unspecified: Secondary | ICD-10-CM | POA: Diagnosis not present

## 2014-09-23 DIAGNOSIS — M79609 Pain in unspecified limb: Secondary | ICD-10-CM | POA: Diagnosis not present

## 2014-09-23 DIAGNOSIS — E785 Hyperlipidemia, unspecified: Secondary | ICD-10-CM | POA: Diagnosis not present

## 2014-09-23 DIAGNOSIS — I10 Essential (primary) hypertension: Secondary | ICD-10-CM | POA: Diagnosis not present

## 2014-09-23 DIAGNOSIS — M1990 Unspecified osteoarthritis, unspecified site: Secondary | ICD-10-CM | POA: Diagnosis not present

## 2014-09-24 DIAGNOSIS — R1909 Other intra-abdominal and pelvic swelling, mass and lump: Secondary | ICD-10-CM | POA: Diagnosis not present

## 2014-09-29 DIAGNOSIS — F439 Reaction to severe stress, unspecified: Secondary | ICD-10-CM | POA: Diagnosis not present

## 2014-09-29 DIAGNOSIS — F438 Other reactions to severe stress: Secondary | ICD-10-CM | POA: Diagnosis not present

## 2014-09-29 DIAGNOSIS — F332 Major depressive disorder, recurrent severe without psychotic features: Secondary | ICD-10-CM | POA: Diagnosis not present

## 2014-10-01 DIAGNOSIS — Z96611 Presence of right artificial shoulder joint: Secondary | ICD-10-CM | POA: Diagnosis not present

## 2014-10-01 DIAGNOSIS — R6889 Other general symptoms and signs: Secondary | ICD-10-CM | POA: Diagnosis not present

## 2014-10-01 DIAGNOSIS — R569 Unspecified convulsions: Secondary | ICD-10-CM | POA: Diagnosis not present

## 2014-10-01 DIAGNOSIS — G479 Sleep disorder, unspecified: Secondary | ICD-10-CM | POA: Diagnosis not present

## 2014-10-12 DIAGNOSIS — L309 Dermatitis, unspecified: Secondary | ICD-10-CM | POA: Diagnosis not present

## 2014-10-15 NOTE — Consult Note (Signed)
Brief Consult Note: Diagnosis: bipolar disorder, alcohol dependence.   Patient was seen by consultant.   Consult note dictated.   Comments: Psychiatry: PAtient seen. Chart reviewed. Patient currently asymptomatic and alert and oriented. Appropriate interaction. Not sure what she was like yesterday but today she seems not acutely sick from a psychiatric standpoint. Patient counceled to stop all alcohol use. No change to meds.Full note dictated.  Electronic Signatures: Clapacs, Madie Reno (MD)  (Signed 24-Sep-14 16:13)  Authored: Brief Consult Note   Last Updated: 24-Sep-14 16:13 by Gonzella Lex (MD)

## 2014-10-15 NOTE — Discharge Summary (Signed)
PATIENT NAME:  Paula Frank, Paula Frank MR#:  [id number] DATE OF BIRTH:  1940/11/05  DATE OF ADMISSION:  03/11/2013 DATE OF DISCHARGE:  03/19/2013  DISCHARGE DIAGNOSES: 1.  Acute respiratory failure.  2.  Bilateral pneumonia.  3.  Sepsis.  4.  Acute on chronic back pain.  5.  Bipolar disorder.  6.  Depression.  7.  Chronic obstructive pulmonary disease exacerbation.  8.  Seizure disorder.  9.  Hyperglycemia.  10.  Hyponatremia.  11.  Anemia of chronic disease.   CONSULTANTS:  1.  Palliative care, Dr. Ermalinda Memos.  2.  Pulmonary, Dr. Humphrey Rolls.  3.  Psychiatry, Dr. Weber Cooks.  DIAGNOSTICS: Imaging studies done include a chest x-ray and CT scan of the chest which showed bilateral infiltrates.   ADMITTING HISTORY AND PHYSICAL: Please see detailed H and P dictated previously. In brief, a 74 year old female patient who continues to smoke in spite of having COPD, on p.r.n. 2 liters oxygen at home, who presented to the Emergency Room complaining of shortness of breath, cough and weakness. The patient was also confused. The patient was admitted to the hospitalist service after finding and bilateral pneumonia on the CT scan.   HOSPITAL COURSE: 1. Acute respiratory failure secondary to bilateral pneumonia. The patient was started on antibiotics of ceftriaxone and azithromycin to which she has slowly improved. Considering she has ongoing smoking and COPD, she has improved slower than what was expected. She had significant amount of cough causing pleuritic chest pain and abdominal pain, which is treated with pain medications. By the day of discharge, the patient's cough is significantly improved. She is on 1 to 2 liters oxygen, which will be continued at home, same as before admission. She has been given antibiotics for 4 more days after discharge. The patient was seen by Dr. Humphrey Rolls of pulmonology who suggested she follow up with him in the clinic and an appointment has been set up. The patient also had COPD exacerbation  for which she was on steroids in the hospital and these will be tapered over 6 days after discharge. The patient had sepsis at the time of admission, which has resolved.  2.  Acute on chronic back pain. This was treated with her home medications and is well controlled by discharge.   Prior to discharge, the patient has minimal wheezing on examination. She is alert and oriented x 3.   DISCHARGE MEDICATIONS: Include:  1.  Requip 3 mg oral once a day.  2.  Bupropion extended-release 100 mg oral 2 times a day.  3.  Cymbalta 30 mg oral once a day. 4.  Baclofen 10 mg oral 3 times a day as needed.  5.  Advair Diskus 250/50 one puff inhaled 2 times a day.  6.  Keppra 500 mg oral 2 times a day.  7.  Gabapentin 300 mg oral 3 times a day.  8.  Seroquel 400 mg oral once a day.  9.  Acetaminophen/oxycodone 325/7.5 mg 1 tablet oral 3 times a day as needed for pain.  10.  Metoprolol succinate 100 mg oral once a day. 11.  Benzonatate 200 mg oral every 4 hours as needed for cough.  12.  Combivent Respimat 1 puff inhaled 4 times a day. 13.  Spiriva 18 mcg inhaled once a day.  14.  Tussionex 5 mL oral 2 times a day as needed for cough.  15.  Prednisone 60 mg oral tapered over 6 days 10 mg each. 16.  Cefuroxime 500 mg oral twice a day for  5 days.   DISCHARGE INSTRUCTIONS: The patient was discharged home on low-sodium, regular consistency diet. Activity as tolerated. Continue oxygen 1 to 2 liters p.r.n. Follow up with primary care physician in 1 to 2 weeks and Dr. Humphrey Rolls of pulmonology in 1 to 2 weeks.   TIME SPENT: On day of discharge in discharge activity was 50 minutes. ____________________________ Leia Alf Nafeesah Lapaglia, MD srs:sb D: 03/20/2013 13:44:37 ET T: 03/20/2013 14:08:58 ET JOB#: [id number]  cc: Alveta Heimlich R. Kayliana Codd, MD, <Dictator> Eduard Clos. Gilford Rile, MD Allyne Gee, MD [place] MD ELECTRONICALLY SIGNED 03/25/2013 15:30

## 2014-10-15 NOTE — H&P (Signed)
PATIENT NAME:  Paula Frank, Paula Frank MR#:  [id number] DATE OF BIRTH:  01/04/1941  DATE OF ADMISSION:  10/03/2012  PRIMARY CARE PHYSICIAN:  Dr. Clayborn Bigness   CHIEF COMPLAINT: Delirium.   HISTORY OF PRESENT ILLNESS:  This is a 74 year old female who is brought here via EMS. Her friend called EMS. Apparently around 3:00 or 4:00 this morning, the patient was very delirious and disoriented, which is not her usual baseline. At her baseline, she is completely oriented and ambulates without any difficulties, but apparently she fell out of bed and was complaining of shoulder pain and was very delirious. She also was complaining of abdominal pain and vomited and when EMS found her, she was covered vomit. She was brought here in a delirious state. The ER physician thought the patient may be in pain the etiology of her delirious state, so she received 4 mg IV morphine, 1 mg of Ativan and Percocet. Her son is at bedside and says that is very unusual for her to be like this and she is very delirious. The patient herself is also complaining of abdominal pain and nausea.   REVIEW OF SYSTEMS: Unable to obtain, as the patient does seem to be delirious and not answering questions appropriately.   PAST MEDICAL HISTORY: 1.  Restless leg syndrome.  2.  Anxiety.  3.  Seizure disorder.  4.  Possible lupus.  5.  Chronic pain.  6.  The patient has chronic right shoulder twitch.   PAST SURGICAL HISTORY: Tubal ligation, hysterectomy, appendectomy, rotator cuff.   MEDICATIONS: Unknown at this time what medications the patient is on.  I did speak with a pharmacist tech, who will try to get the information.  ALLERGIES: LEVAQUIN, CONTRAST DYE AVELOX, SULFA, PENICILLIN AND AMOXICILLIN.   SOCIAL HISTORY: The patient says resident at [facility]. No smoking or alcohol use per the son, who is at bedside.   FAMILY HISTORY: Unknown.   PHYSICAL EXAMINATION: VITAL SIGNS: Temperature 97.7, pulse 94, respirations 18, blood pressure  125/91, 97% on room air.  GENERAL: The patient is disoriented, only oriented to name, not place or time. She does seem to be inappropriately delusional.  HEENT: Head is atraumatic. Pupils are round and reactive. Sclerae are anicteric. Mucous membranes are moist.  OROPHARYNX: Clear. No enlarged thyroid.  CARDIOVASCULAR: Regular rate and rhythm. No murmurs, gallops, or rubs. PMI is not displaced. LUNGS:  She has some mild rhonchi. No wheezing, rales or crackles are heard.  ABDOMEN: Soft; however, she has significant tenderness and grimaces on palpation, but no rebound is noted. Bowel sounds are positive and normal.    EXTREMITIES: No clubbing, cyanosis, or edema. She has a right shoulder twitch and her legs are also twitching, which her son says it is at her baseline.  NEUROLOGIC:  Hard to do a neurologic examination. The patient is not very cooperative with the exam, but, cranial nerves do seem to  be intact and she does move all extremities. Reflexes are hyperreflexic bilaterally and symmetrically.  SKIN: No rashes.   LABORATORY, DIAGNOSTIC, AND RADIOLOGICAL DATA: Urinalysis shows negative  leukocyte esterase or nitrites. CK 107, CK-MB 5.2. Troponin is 0.02. Urine toxicology is negative. Alcohol level less than 3. Sodium 144, potassium 4.0, chloride 110, bicarbonate 30, BUN 20, creatinine 0.63 glucose 124, calcium 9.1, bilirubin 0.3, alkaline phosphatase 123, ALT is 24, AST 29, total protein 7.4, albumin 3.5, lipase 56. White blood cells 12.5, hemoglobin 14, hematocrit 41.1, platelets 369.   CT of the head shows no acute Virchow-Robin  space.   Chest x-ray shows no acute disease.   EKG normal sinus rhythm. There is no ST elevations or depressions.  ASSESSMENT AND PLAN: A38 year old female who is normally at baseline without any neurological issues, who presents today with acute delirium and encephalopathy.  1.  Encephalopathy with acute delirium of unclear etiology. It does not seem to be metabolic  at this time. The patient does seem inappropriately delusional.  I am going to Narcan, apparently she had some new medications prescribed to her, one of which is a pain medication. It is clear if she actually took these or not, but we can try Narcan and see if this works.  I will also check a TSH and vitamin B12 level, although this would not break acute delirium. All of her other lab work here; urinalysis and CT seem to be fine now. We will continue with neuro checks and if she continues to be delirious, I would obtain a repeat a head CT to make sure she has not had a stroke, but once again, her neurologic examination, I could not a good neurologic exam, but no there is focal deficits noted. I am holding all medications because I do not know any of her medications at this time,  like I said, I have asked the pharmacy tech to obtain her medication list.  2.  Abdominal pain with nausea and vomiting. She does have abdominal pain on exam. There is no rebound or guarding. I will order a CT noncontrast to evaluate her abdominal pain. The patient is currently nothing by mouth status.  3.  Bipolar affective disease.  According to the son, she has bipolar affective disease. I do not have her medication list. Once we know what the medication list we may be able to restart these and it may also help [place] with the etiology of her altered mental status.   CODE STATUS: The patient is full code, according to the son.   TIME SPENT: Approximately 50 minutes.   ____________________________ Donell Beers. Benjie Karvonen, MD spm:cc D: 10/03/2012 15:21:37 ET T: 10/03/2012 15:40:49 ET JOB#: [id number]  cc: Windsor Goeken P. Benjie Karvonen, MD, <Dictator> Edgerton Blocker, MD Vickki Igou P Avonne Berkery MD ELECTRONICALLY SIGNED 10/03/2012 20:38

## 2014-10-15 NOTE — H&P (Signed)
PATIENT NAME:  Paula Frank, Paula Frank MR#:  [id number] DATE OF BIRTH:  November 23, 1940  ADMITTING PHYSICIAN: Gladstone Lighter, M.D.   PRIMARY CARE PHYSICIAN: Dr. Clayborn Bigness. Now that he has moved, she states she is supposed to see Dr. Ronette Deter, and her appointment is scheduled for October 1.   CHIEF COMPLAINT: Feeling nervous, not feeling good, and acute respiratory distress.   HISTORY OF PRESENT ILLNESS: Paula Frank is a 74 year old Caucasian female, very poor historian.   PAST MEDICAL HISTORY: Significant for COPD, seizure disorder, depression and anxiety, restless leg syndrome, chronic pain from rotator cuff surgeries, especially between the shoulder blades, who comes from home to the hospital secondary to the above-mentioned complaints.  Again the patient is a very poor historian. She has been in severe pain since the time she came. She is complaining of pain between her shoulder blades which acute on chronic. She was found to be tachypneic and also hypoxic and was placed on 2 liters oxygen. Saturations were still around 89% to 90%, so oxygen increased up to 4 liters, at which time she is saturating 94%.  Her CT of the chest was done. Did not show any dissection or enlargement of the aorta but showed bilateral pneumonia. She has an elevated white count and is being admitted for treatment of pneumonia. The patient is very anxious and has been in a lot of pain. She denies any nausea, vomiting, fever or chills. She feels like her symptoms might have started about 2 weeks ago with worsening of her chronic cough.   PAST MEDICAL HISTORY:  1.  COPD. 2.  Restless leg syndrome.  3.  Bipolar disorder with depression and anxiety.  4.  Seizure disorder.  5.  Chronic pain disorder.  6.  Chronic right shoulder twitch.   PAST SURGICAL HISTORY:  1.  Tubal ligation.  2.  Hysterectomy.  3.  Appendectomy.  4.  Bilateral rotator cuff surgery.     ALLERGIES: LEVAQUIN, CONTRAST DYE, AVELOX, SULFA, PENICILLIN,  AND AMOXIL. SHE HAS A RASH TO PENICILLIN AND AMOXICILLIN.   CURRENT HOME MEDICATIONS:  1.  Advair 250/50 one puff b.i.d.  2.  Combivent Respimat one puff 4 times a day.  3.  Baclofen 10 mg p.o. 3 times a day as needed for pain.  4.  Bupropion extended-release 100 mg b.i.d.  5.  Cymbalta 30 mg p.o. daily.  6.  Gabapentin 300 mg p.o. 3 times a day.  7.  Keppra 500 mg p.o. b.i.d.  8.  Toprol 25 mg p.o. daily.  9.  Quetiapine 400 mg p.o. at bedtime.  10.  Requip 3 mg p.o. daily.  11.  Spiriva inhaler daily.   SOCIAL HISTORY: The patient is currently a resident. The patient states she is coming from home, not sure if it is like an independent facility or assisted living. She is not able to give any further information. She continues to smoke about 1 pack per day. Her son has been contacted and message left and waiting call back. No alcohol use.   FAMILY HISTORY: One son passed away from intra-abdominal cancer. She still has 2 other living sons. Mom with liver cancer and dad had esophageal cancer.   REVIEW OF SYSTEMS: CONSTITUTIONAL: No fever, fatigue or weakness.  EYES: Positive for blurred vision, inflammation or glaucoma.  ENT: No tinnitus, ear pain, hearing loss, epistaxis or discharge.  RESPIRATORY: Positive for cough. No wheezing. Positive for COPD. Positive for dyspnea on  exertion.  CARDIOVASCULAR: No chest pain, positive for orthopnea  and no pedal edema. No palpitations or syncope.  GASTROINTESTINAL: No nausea, vomiting, diarrhea, abdominal pain, hematemesis or melena.  GENITOURINARY: No dysuria, hematuria, renal calculus, urinary frequency or incontinence.  ENDOCRINE: No polyuria, nocturia, thyroid problems, heat or cold intolerance.  HEMATOLOGY: No anemia, easy bruising or bleeding.  SKIN: No acne, rash or lesions.  MUSCULOSKELETAL: Severe upper back pain between both shoulders. No gout. Positive for arthritis.  NEUROLOGIC: No numbness, CVA, TIAs or seizures.  PSYCHOLOGICAL:  Positive for history of anxiety and depression.   PHYSICAL EXAMINATION:  VITAL SIGNS: Temperature 98.1 degrees Fahrenheit, pulse 110, respirations 18, blood pressure 128/73, pulse oximetry 91% on 4 liters.  GENERAL: Well-developed, well-nourished female in acute distress secondary to pain, twitching around in bed.  HEENT: Normocephalic, atraumatic. Pupils equal, round, reacting to light. Anicteric sclerae. Extraocular movements intact. Oropharynx clear without erythema, mass or exudates.  NECK: Supple. No thyromegaly, JVD or carotid bruits. No lymphadenopathy.  LUNGS: Coarse rhonchi at both bases. No wheeze heard. No crackles. Use of accessory muscles present on exertion, not at rest.  CARDIOVASCULAR: S1, S2, regular rate and rhythm. No murmurs, rubs or gallops.  ABDOMEN: Soft, nontender, nondistended. No hepatosplenomegaly. Normal bowel sounds.  EXTREMITIES: Show 1+ pedal edema, 1+ weak dorsalis pedis pulses palpable bilaterally. No clubbing or cyanosis.  LYMPHATICS: No cervical lymphadenopathy.  NEUROLOGIC: Cranial nerves seem to be intact, II through XII, and she has a generalized twitching of all 4 extremities which, according to her, is chronic, and there is also a right shoulder twitch. This is her baseline based on previous admissions and exams. Reflexes are hyperreflexic bilaterally but she is able to move all 4 extremities in bed.  PSYCHOLOGICAL: She seems alert and oriented x3.   LABORATORY DATA: WBC 21.5, hemoglobin 12.8, hematocrit 38.7, platelet count 344.  Sodium 133, potassium 4.1, chloride 102, bicarb 24, BUN 14, creatinine 0.78, glucose 77 and calcium of 9.1. Troponin less than 0.02. Lipase 54. Alcohol level is within normal limits. TSH is 1.28.   CT of the chest, abdomen and pelvis was done because  of her nonspecific nature of complaints and diffuse pain and THIS WAS DONE WITHOUT CONTRAST BECAUSE OF HER CONTRAST ALLERGY and it shows abnormal appearance of lungs with ill-defined  interstitial and alveolar densities concerning for bilateral pneumonia. Atherosclerotic disease with no evidence of aneurysm or exsanguination seen. Scattered colonic diverticulosis without evidence of diverticulitis noted.   Urinalysis is pending at this time. EKG showing sinus tachycardia. No acute ST-T-wave abnormalities. Heart rate of 112.  ASSESSMENT AND PLAN: A 74 year old female with past medical history of bipolar disorder with depression and anxiety with restless leg syndrome, chronic shoulder pain from rotator-cuff surgery who comes with acute respiratory distress and also back pain.  1.  Acute respiratory distress with hypoxia and tachypnea on presentation, improving with 4 liters oxygen at this time, and the patient is not on home oxygen. CT of the chest showed bilateral pneumonia. Blood cultures have been ordered. Started on Rocephin and azithromycin at this time. Continue oxygen support and wean as tolerated.  2.  Systemic inflammatory response syndrome secondary to pneumonia with tachycardia and leukocytosis on presentation. Continue antibiotics and monitor.  3.  Acute-on-chronic back pain, tender everywhere. She has chronic pain issues. A very poor historian. Continue on p.r.n. pain medications and palliative care consult for pain management. Continue the patient's baclofen  and gabapentin.  4.  Bipolar with depression and anxiety. Continue with her outpatient medications which are  bupropion, Cymbalta, quetiapine.  5.  Chronic obstructive pulmonary disease, appears stable. No wheezing on exam. Continue inhalers and add p.r.n. nebs. 6.  History of seizure disorder. Continue Keppra. Recent EEG done from the last admission is negative.  7.  Gastrointestinal and deep vein thrombosis prophylaxes.   CODE STATUS: FULL CODE.   TIME SPENT ON ADMISSION: 50 minutes.   ____________________________ Gladstone Lighter, MD rk:np D: 03/11/2013 18:19:00 ET T: 03/11/2013 19:28:39  ET JOB#: [id number]  cc: Gladstone Lighter, MD, <Dictator> Gladstone Lighter MD ELECTRONICALLY SIGNED 03/12/2013 10:40

## 2014-10-15 NOTE — Consult Note (Signed)
Details:   - Psychiatry: Patient seen. She is calmer today and not looking uncomfortable. She is groggy but not completely unconcious - can nod and shake head to questions. Didn't take her po meds. I will, for what it is worth, remind her to take her po meds and also continue the standing ativan at "DTs" doses of 2mg  iv q2 for another 24 hours and then hopefully we can start to taper it back. I would still guess a majority of this is DTs but also think she should get back soon on the Keppra, Seroquel and Requip that were stable meds before. Will check in tomorrow.   Electronic Signatures: Gonzella Lex (MD)  (Signed 12-Apr-14 11:25)  Authored: Details   Last Updated: 12-Apr-14 11:25 by Gonzella Lex (MD)

## 2014-10-15 NOTE — Consult Note (Signed)
PATIENT NAME:  Paula Frank, MILLEY MR#:  [id number] DATE OF BIRTH:  [birth year]  DATE OF CONSULTATION:  03/18/2013  CONSULTING PHYSICIAN:  Gonzella Lex, MD  IDENTIFYING INFORMATION AND REASON FOR CONSULTATION: A 74 year old woman with a history of bipolar disorder and alcohol abuse who was admitted to the hospital for respiratory failure. Consultation because she seemed to be agitated and having mood problems.   HISTORY OF PRESENT ILLNESS: Information obtained from the patient and the chart. The patient was admitted on the 17th with respiratory problems. She has been treated here for pneumonia. She was continued on her outpatient psychiatric medicines. The patient tells me that her mood has been anxious while she has been in the hospital because she is worried about her health, but she denies that she has been feeling severely depressed. She denies hopelessness. Denies any suicidal ideation. She states that she is looking forward to getting back home because she just recently moved into a new apartment with her female friend. She denies that she has been having racing thoughts and does not show evidence of any bizarre thinking. She says that she has been compliant with her outpatient medications and can recite them correctly.   She admits that she drinks alcohol outside the hospital and has been drinking 1 or 2 glasses of wine a day. Currently she denies feeling depressed and denies any feeling of agitation. Denies any psychotic symptoms. Says that she has been sleeping much better.   PAST PSYCHIATRIC HISTORY: The patient states that she had her first episode of severe depression in 1978 resulting in an extended hospitalization. She has had a few hospitalizations since then and has been diagnosed with bipolar disorder. She was hospitalized here one time previously on the psychiatry ward.   She also has a history of abusing alcohol and prescription drugs. I saw her in consultation earlier in the year, at  which time I thought that she was probably having a delirium related to alcohol withdrawal. The patient denies that she has any history of recent suicide attempts, although she said she did take an overdose in 1978. Denies any symptoms that would really be consistent with a clear mania. Denies ever having had psychotic symptoms. She said she was on lithium for many years but was eventually taken off of it and more recently has been maintained on a combination of antidepressants and Seroquel. She denies any history of violence.   PAST MEDICAL HISTORY: The patient has COPD and currently has a pneumonia that she is recovering from. She has a history of a seizure disorder, although I think it is unclear whether this is really a separate seizure disorder or might just be alcohol withdrawal seizures. Has a history of restless legs and a history of high blood pressure.   SOCIAL HISTORY: The patient is widowed. Her husband died just a few years ago. She had recently been living at [facility] assisted living facility but has moved out, and she and her new female companion are renting an apartment together at the [facility]. Her description of it sounds very appropriate. She does have a close relationship with her son.   CURRENT MEDICATIONS: Here in the hospital right now, she is getting azithromycin IV, bupropion SR 100 mg b.i.d., Cymbalta 30 mg per day, gabapentin 300 mg three times a day, Keppra 500 mg twice a day, meropenem IV, methylprednisolone 60 mg every 6 hours, metoprolol ER 25 mg a day, pantoprazole 40 mg in the morning, Seroquel 400 mg at night,  Requip 3 mg at night, albuterol ipratropium inhaler 4 times a day, fluticasone salmeterol inhaler, Advair Diskus twice a days, Spiriva HandiHaler every day.   ALLERGIES: AMOXICILLIN,  AVELOX, CONTRAST DYE, LEVAQUIN, PENICILLIN DRUGS AND SULFA DRUGS.   MENTAL STATUS EXAMINATION: The patient was awake and alert and sitting up on the side of the bed when I came in. She  was appropriate in her interaction with me. She made good eye contact. Her psychomotor activity was notable, but she was wiggling her shoulder throughout the interview. When I asked her about it, she explained that she was trying to keep it moving because of her rotator cuff injury. Her speech was of normal rate, tone and volume. Her affect was appropriate and reactive. Mood was stated as being okay. Thoughts appear to be lucid without any loosening of associations. Denies auditory or visual hallucinations. Denies suicidal or homicidal ideation. The patient was completely alert and oriented, able to remember her medications and her current treatment and how many days she has been here. Seems to be of normal intelligence. I did not complete full cognitive testing.   ASSESSMENT: A 74 year old woman with a history of bipolar disorder and alcohol abuse. I am not clear what the mental state was that was concerning yesterday. I note that she also has been very sick and also is getting Solu-Medrol injections which can play havoc with a person's bipolar disorder and make them acutely agitated or anxious or even depressed. Nevertheless, right now she appears to be stable and not showing any acute psychiatric symptoms. Her current medication regimen is appropriate for her history and is what she has been taking outside the hospital and has been well tolerated. Appears to be stable.   TREATMENT PLAN: Supportive therapy done. I strongly encouraged the patient to not drink any further alcohol, explaining that it could severely disrupt her mood and her health. She stated she knew I was right and planned to do that. Continue current psychiatric medicines. She will need appropriate follow-up after discharge. Previously her primary care doctor was doing it.   DIAGNOSIS, PRINCIPAL AND PRIMARY:  AXIS I: Bipolar disorder, type 2, currently asymptomatic.   SECONDARY DIAGNOSES:  AXIS I: Alcohol dependence.  AXIS II: No  diagnosis.  AXIS III: Chronic obstructive pulmonary disease, acute pneumonia.  AXIS IV: Moderate from multiple medical problems.  AXIS V: Functioning at time of evaluation, 17.    ____________________________ Gonzella Lex, MD jtc:np D: 03/18/2013 16:22:16 ET T: 03/18/2013 17:03:43 ET JOB#: [id number]  cc: Gonzella Lex, MD, <Dictator> Gonzella Lex MD ELECTRONICALLY SIGNED 03/18/2013 19:06

## 2014-10-15 NOTE — Consult Note (Signed)
Brief Consult Note: Diagnosis: delirium.   Patient was seen by consultant.   Consult note dictated.   Recommend further assessment or treatment.   Orders entered.   Comments: Psychiatry: Patient seen. Patient admitted from ER with delirium. On exam patient is not able to participate with interview. Mostly ignores me until I started asking about substance abuse, then started yelling at me. Patient has a documented history of bipolar disorder with extended hospitalizations in the past and most recent treatment with seroquel 400mg  qhs and cymbalta and wellbutrin. She also has a documented history of substance abuse including alcohol and opiates. Current drug screen neg and neg alcohol level. Delirium unknown cause Could be DTs despite her loud protest that she hasn't been drunking.  Will restart Keppra for seizure disorder, seroquel at slightly attenuated dose for bipolar disorder, requip for restless legs.  Will put her on standing ativan in case this is DTs. I'll check up tomorrow. Also, she is coughing and breathing hard. I saw the CXR was neg but still wonder if she could be about to show an infection.  Electronic Signatures: Gonzella Lex (MD)  (Signed 11-Apr-14 20:23)  Authored: Brief Consult Note   Last Updated: 11-Apr-14 20:23 by Gonzella Lex (MD)

## 2014-10-15 NOTE — Consult Note (Signed)
Psychiatry: Followup visit with this patient who is being treated for delirium and has a history of bipolar disorder and alcohol dependence. Previously when I had seen the patient she was in the throws of a delirium and was unable to offer much history. I had deduced that the most likely cause was delirium tremens and put her on standing and gradually tapering doses of lorazepam. I find the patient awake and alert. She has one of her sons present and is interacting appropriately. She made good eye contact with me. Her speech was normal in rate and tone. Her affect was smiling and upbeat. Patient was alert and oriented to where she was and the month and year although I think she still does not have a full grasp of her hospital course. The patient was able to confirm forming her previously prescribed medications. It sounds like basically it was the same medicines listed in her discharge summary from psychiatry last time. She also was able to confirm for me that she had been drinking prior to coming into the hospital. seems to have gone through what really was delirium tremens. She seems to be back to nearly her baseline mental state. I did not do a full Mini-Mental status exam today and I did not address issues of competency. At this point continue the patient off of lorazepam, continue the Seroquel but at a dose of 400 mg at night, restart Cymbalta 60 mg per day. I would not however restart Nuvigil which she had taken previously as I don't think there is probably any indication for it.also told me that at home she uses on Advair discus regularly and needs it to treat her shortness of breath. I have gone ahead and restarted that as well. She tells me that she still has not been able to get out of bed or stand independently so I assume she is not at the point of being discharged yet. I will continue to followup. If there are other specific questions please let me know.  Electronic Signatures: Aleatha Taite, Madie Reno (MD)   (Signed on 17-Apr-14 14:03)  Authored  Last Updated: 17-Apr-14 14:03 by Gonzella Lex (MD)

## 2014-10-15 NOTE — Discharge Summary (Signed)
PATIENT NAME:  Paula Frank, Paula Frank MR#:  [id number] DATE OF BIRTH:  1940/08/03  DATE OF ADMISSION:  10/03/2012 DATE OF DISCHARGE:  10/12/2012  Interim discharge dictation done on 10/09/2012 by Dr. Hillary Bow.  CONSULTATIONS:  Psychiatry consult with Dr. Weber Cooks.    PRIMARY CARE PHYSICIAN:  The patient's primary doctor is Dr. Clayborn Bigness.   DISCHARGE DIAGNOSES: 1.  Encephalopathy, acute delirium. 2.  Bipolar disorder.  3.  Depression. 4.  Hypertension.  5.  Seizure disorder.  6.  Tobacco abuse. 7.  Chronic obstructive pulmonary disease with pneumonia.   DISCHARGE MEDICATIONS:  Requip 3 mg by mouth daily, Zofran 4 mg by mouth 3 times daily as needed for nausea, bupropion  extended release 100 mg by mouth twice daily, Cymbalta 30 mg by mouth daily, baclofen 10 mg by mouth 3 times daily, Advair Diskus 250/50 1 puff twice daily, Keppra 500 mg by mouth q. 12 hours, folic acid 1 mg by mouth daily, thiamine 100 mg by mouth daily, lactulose 30 ml po bid prn for constipation,by mouth twice daily for constipation, Neurontin 300 mg by mouth 3 times daily, metoprolol 25 mg by mouth daily, Seroquel 100 mg by mouth daily, DuoNebs, Combivent 1 puff 4 times daily, diazepam 2 mg tablets every 12 hours.  The patient got diazepam only until April 30.  Spiriva 18 mcg inhalation daily, azithromycin 500 mg by mouth daily, started on April 28th and the patient was given Z-Pak and that will be finished on April 23rd.  The patient also is discharged home with home oxygen 2 liters and also home health physical therapy.  The patient's oxygenation on room air at rest 92%, on exertion up to 84%.  With oxygen it went up to 90, so we will discharge her with oxygen 2 liters as she became hypoxic on exertion with sats of 84%.  The patient advised to quit smoking, given a prescription for Spiriva, Combivent along with antibiotic and oxygen.  The patient is to follow up with Dr. Clayborn Bigness.   HOSPITAL COURSE:  A 74 year old female  patient admitted on April 11th.  Look at the history and physical for full details.  The patient was disoriented and brought in by the family because of delirious state and the patient also complained of shoulder pain before that.  CT head did not show any acute abnormality on admission and UA is clear and the patient's alcohol level less than 3.  He is admitted to hospitalist service for encephalopathy and acute delirium of unclear etiology.  The patient was started on CIWA protocol for possible alcohol withdrawal symptoms.  The patient also seen by psychiatrist, Dr. Weber Cooks.  The patient's confusion resolved as well as encephalopathy was cleared.  .  According to the patient's son the patient does not drink that much of alcohol and he did not think it is alcohol withdrawal; however, after talking to him he mentioned that patient uses pain medications for her shoulder pain so it could be a possibility connected to her pain medication overdose and the patie confusion likely due to narcotics.  her mental status was cleared, that was on 20th.  I met the son and he explained that patient's encephalopathy is likely not due to alcohol and he is going to stay with her all the time and we arranged  home health physical therapy.  The patient also seen by Dr. Weber Cooks and she is on Seroquel and Requip which can be continued and the patient also had an EEG which did  not show any epileptiform changes and initially MRI was ordered, but the patient was unable to get it done because she could not lie still with agitation from MRI scan cancelled as her  encephalopathy was cleared so there was no need to get the MRI and the patient also had some vomiting when she came, but was resolved.   EKG showed normal sinus rhythm.  Urine toxicology was normal.  CBC and BMP also was normal on admission.  UA was clear.  The patient also had a CT of the abdomen as she complained of abdominal pain and vomiting.  Her CT abdomen also did not show  any acute changes.  It showed only diverticulosis and B12 level showed 319, and patient's ammonia is slightly up at 35.  So, she was better with diazepam, discharged with tapering course of diazepam.   The patient had a cough and also some shortness of breath.  Repeat chest x-ray which showed some bilateral lung atelectasis, infiltrate not excluded so we started her on Rocephin along with nebulizers and Spiriva.  The patient felt better because of her COPD and active tobacco abuse.  Counseled her on smoking.  Continued on oxygen.  Check the O2 sats on room air and also on exertion.  The patient's oxygen saturation dropped to 84% on exertion without oxygen, so we had discharged her with oxygen 2litres [place] )   The patient was seen by physical therapy, recommended home physical therapy and we arranged for home health physical therapy, advanced home care.  The patient's condition at the time of discharge stable. The patient went home with the son.   Time spent on discharge preparation more than 30 minutes.     ____________________________ Epifanio Lesches, MD sk:ea D: 10/23/2012 22:14:00 ET T: 10/24/2012 00:33:31 ET JOB#: [id number]  cc: Epifanio Lesches, MD, <Dictator> Epifanio Lesches MD ELECTRONICALLY SIGNED 10/28/2012 8:42
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
## 2014-10-18 LAB — SURGICAL PATHOLOGY

## 2014-10-22 ENCOUNTER — Ambulatory Visit
Admit: 2014-10-22 | Disposition: A | Discharge status: Home / Self Care | Payer: Self-pay | Attending: Vascular Surgery | Admitting: Vascular Surgery

## 2014-10-22 DIAGNOSIS — Z01812 Encounter for preprocedural laboratory examination: Secondary | ICD-10-CM | POA: Diagnosis not present

## 2014-10-22 DIAGNOSIS — K5900 Constipation, unspecified: Secondary | ICD-10-CM | POA: Diagnosis not present

## 2014-10-22 DIAGNOSIS — K4090 Unilateral inguinal hernia, without obstruction or gangrene, not specified as recurrent: Secondary | ICD-10-CM | POA: Diagnosis not present

## 2014-10-22 DIAGNOSIS — Z91041 Radiographic dye allergy status: Secondary | ICD-10-CM | POA: Diagnosis not present

## 2014-10-24 NOTE — Discharge Summary (Signed)
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   PROCEDURE: Reverse right total shoulder arthroplasty using an Biomet comprehensive system with a 9 humeral stem, a standard humeral tray with a standard humeral bearing and 36 mm glenosphere with mini base plate.   SURGEON: Dorien Chihuahua, M.D.   ASSISTANT: Anitra Lauth, PA.   ANESTHESIA: General.   FINDINGS: As noted above.   COMPLICATIONS: None.   ESTIMATED BLOOD LOSS: 150 mL.  TOTAL FLUIDS: 1000 mL.  TOURNIQUET TIME: None.   DRAINS: None.  CLOSURE: Staples.   HISTORY: The patient is a 74 year old female with several year history of progressive worsening right shoulder pain. She is status post a failed prior rotator cuff repair from 10 years earlier. Her symptoms have persisted despite medications and activity modification. Her history and examination were consistent with cuff tear arthropathy with a chronic massive  rotator cuff tear all of which were confirmed by MRI scan. She presents at this time for a reverse right total shoulder arthroplasty.  PHYSICAL EXAMINATION:  GENERAL: Well-developed, well-nourished female in no apparent distress. Normal affect. She ambulates with a cane.  HEART: Regular rate and rhythm.  LUNGS: Clear to auscultation bilaterally. No wheezing, rales, or rhonchi.  RIGHT LOWER EXTREMITY: Examination shows the patient has limited range of motion of the right shoulder with flexion and adduction of 90 degrees. She is tender in the subacromial space and anterior and posterior aspects of the shoulder. She has limited external rotation and painful external rotation. She is neurovascularly intact in the right upper extremity.   HOSPITAL  COURSE: The patient was admitted to the hospital on 08/19/2014. She had surgery that same day and was brought to the orthopedic floor from the PACU in stable condition. On postoperative day 1, the patient had an acute postoperative blood loss anemia with hemoglobin of 10.2. Vital signs are stable. INR was stable. She is doing well. On postoperative day 2, the patient had some slight dizziness and decreased ambulation with physical therapy. Her vital signs were stable. On postoperative day 3, the patient had increased progress in physical therapy. She is no longer symptomatic with dizziness. She is doing well and ready for discharge to home with home health physical therapy.    CONDITION AT DISCHARGE: Stable.   DISCHARGE INSTRUCTIONS: The patient may increase weight-bearing on the affected extremity. Elevate the heels off the bed, encouraged cough and deep breathing. Right shoulder immobilizer on at all times and remove for bathing.  The patient may resume a regular diet as tolerated. Continue using the [facility] unit. Maintain temperature between 40-50 degrees. Do not get the dressing bandage wet or dirty. Call [place] if the dressing gets water under it. Leave the dressing on as needed with physical therapy. Call [facility] for any of the following: Bright red bleeding from the incision wound, fever 100.5 degrees, redness, swelling, or drainage at the incision site. Call [place] if you experience any increased weakness in legs or bowel or bladder symptoms. Home physical therapy has been arranged for continuation of rehab program. Please call [place] if therapist has not contacted you within 48 hours of your return home. The patient needs be seen in [place] in 2  weeks. Please call and schedule that appointment.   DISCHARGE MEDICATIONS: Please see list of discharge medications and discharge instructions.   ___________________________ T. Rachelle Hora, PA-C tcg:mc D: 08/22/2014 09:03:24 ET T: 08/22/2014 09:54:36 ET JOB#: [id number]  cc: T. Rachelle Hora, PA-C, <Dictator> Duanne Guess [place] ELECTRONICALLY SIGNED 08/31/2014 7:56

## 2014-10-24 NOTE — Op Note (Signed)
PATIENT NAME:  Paula Frank, Paula Frank MR#:  [id number] DATE OF BIRTH:  06-27-1940  DATE OF PROCEDURE:  08/19/2014  PREOPERATIVE DIAGNOSIS: Degenerative joint disease with chronic rotator cuff tear, right shoulder.   POSTOPERATIVE DIAGNOSIS: Degenerative joint disease with chronic rotator cuff tear, right shoulder.  PROCEDURE: Reverse right total shoulder arthroplasty using an all-pressfit Biomet comprehensive system with a #9 mini-humeral stem, a standard humeral tray with a standard humeral bearing, and a 36 mm glenosphere with mini base plate.   SURGEON: Pascal Lux, MD    ASSISTANT: Anitra Lauth, PA  ANESTHESIA: General endotracheal.   FINDINGS: As noted above.   COMPLICATIONS: None.   ESTIMATED BLOOD LOSS: 150 mL.   TOTAL FLUIDS: Crystalloid 1000 mL.   TOURNIQUET: None.   DRAINS: None.   CLOSURE: Staples.   BRIEF CLINICAL NOTE: The patient is a 74 year old female with a several year history of progressively worsening right shoulder pain. She is status post a failed prior rotator cuff repair from 10 years earlier. Her symptoms have persisted despite medications, activity modification, etc. Her history and examination were consistent with cuff tear arthropathy with a chronic massive irreparable rotator cuff tear, all of which were confirmed by MRI scan. She presents at this time for a reverse right total shoulder arthroplasty.   PROCEDURE IN DETAIL: The patient was brought into the operating room and lain in the supine position. After adequate general endotracheal intubation and anesthesia were obtained, she was repositioned in the beach chair position using the beach chair positioner. The right shoulder and upper extremity were prepped with ChloraPrep solution before being draped sterilely. Preoperative antibiotics were administered. A standard anterior approach to the shoulder was made through an approximately 4-5 inch incision. This began at the coracoid process and extended  distally along the deltopectoral interval. The incision was carried down through the subcutaneous tissues. The deltopectoral interval was identified. While dissecting out the cephalic vein, it began to bleed, so it was tied off with two #0 Ethibond interrupted sutures. The deltopectoral interval was bluntly dissected down to expose the conjoined tendon. The lateral border of the conjoined tendon was identified and this plane developed to provide access to the anterior aspect of the shoulder. The subscapularis tendon was divided, leaving a 1 cm cuff of tissue attached to the lesser trochanter. The "three sisters" were identified inferiorly and cauterized. The tendon was elevated medially and released from the underlying capsule. The inferior capsule was dissected and released after dissecting beneath it and placing a sponge and a small Chandler retractor to protect the axillary nerve beneath it. The arm was placed in approximately 30 degrees of external rotation, and the humeral head cut made using the external guide. Care was taken to make the cut at approximately 30 degrees of retroversion. The humeral head was taken to the back table.   Attention was directed to the glenoid side. The labrum was debrided circumferentially. The center of the glenoid was identified and marked. The central drill guide was inserted and advanced with care taken to remain within the glenoid neck. This was verified by palpation along the anterior aspect of the glenoid neck. The central glenoid reamer was inserted and used to ream sufficiently to get good bleeding bone circumferentially without being overzealous. The baseplate was inserted. The central screw was measured and the depth of the central screw was determined to be 35 mm. This was inserted and tightened securely. Superior and inferior peripheral locking screws were placed and anterior and posterior nonlocking  peripheral screws were placed of appropriate length. The 36 mm  glenosphere and adapter were put together on the back table. The adapter was positioned at the "offset C" position to translate the glenosphere inferiorly.   Attention was directed to the humeral side. The humeral shaft was reamed sequentially, beginning with a 5 mm reamer and progressing to a 9 mm reamer. The canal was then broached sequentially, beginning with a #7 broach and progressing to a #9 broach. This was left in place and a trial reduction performed using the standard humeral tray and the standard humeral bearing. The shoulder was reduced and placed through a range of motion. There was no undue tension on the shoulder, yet it was stable to extension and external rotation, as well as with adduction and internal rotation. The hand could be brought across the chest to the opposite shoulder, to the top of the head, and to the mouth. This was felt to be satisfactory. The trial components were removed and the wound irrigated thoroughly with sterile saline solution. The permanent #9 stem was impacted into place with care taken to maintain the appropriate version. The permanent humeral tray and bearing, which was put together on the back table, was inserted. The Morse taper locking mechanism was verified using manual distraction and found to be excellent. The shoulder was relocated and, again, placed through range of motion, the findings as described above.   The wound was copiously irrigated with sterile saline solution via the jet lavage system before the anterior, inferior, and posterior capsular tissues as well as the peri-incisional tissues were injected with a total of 20 mL of Exparel diluted out to 60 mL with normal saline and 30 mL of 0.5% Sensorcaine with epinephrine to help with postoperative analgesia. The subscapularis tendon was reapproximated using #2 FiberWire interrupted sutures. The deltopectoral interval was closed using 2-0 Vicryl interrupted sutures. At this point, 1 gram of tranexamic  acid in diluted out to 10 mL with normal saline was injected interarticularly to help with postoperative bleeding. The subcutaneous tissues were closed using 2-0 Vicryl interrupted sutures before the skin was closed using staples. A sterile occlusive dressing was applied to the shoulder before the arm was placed into a shoulder immobilizer. The patient was then awakened, extubated, and returned to the recovery room in satisfactory condition after tolerating the procedure well.    ____________________________ J. Dorien Chihuahua, MD jjp:bm D: 08/19/2014 16:10:46 ET T: 08/20/2014 02:41:40 ET JOB#: [id number]  cc: Pascal Lux, MD, <Dictator> Pascal Lux MD ELECTRONICALLY SIGNED 08/24/2014 11:43

## 2014-10-25 ENCOUNTER — Encounter: Payer: Self-pay | Admitting: Pain Medicine

## 2014-10-25 ENCOUNTER — Ambulatory Visit: Payer: Medicare Other | Attending: Pain Medicine | Admitting: Pain Medicine

## 2014-10-25 VITALS — BP 116/89 | HR 97 | Temp 98.7°F | Resp 18 | Wt 120.0 lb

## 2014-10-25 DIAGNOSIS — M47817 Spondylosis without myelopathy or radiculopathy, lumbosacral region: Secondary | ICD-10-CM | POA: Diagnosis not present

## 2014-10-25 DIAGNOSIS — M25511 Pain in right shoulder: Secondary | ICD-10-CM | POA: Insufficient documentation

## 2014-10-25 DIAGNOSIS — M19011 Primary osteoarthritis, right shoulder: Secondary | ICD-10-CM | POA: Diagnosis not present

## 2014-10-25 DIAGNOSIS — M62830 Muscle spasm of back: Secondary | ICD-10-CM | POA: Insufficient documentation

## 2014-10-25 DIAGNOSIS — M25512 Pain in left shoulder: Secondary | ICD-10-CM | POA: Diagnosis not present

## 2014-10-25 DIAGNOSIS — R51 Headache: Secondary | ICD-10-CM | POA: Insufficient documentation

## 2014-10-25 DIAGNOSIS — M1990 Unspecified osteoarthritis, unspecified site: Secondary | ICD-10-CM | POA: Insufficient documentation

## 2014-10-25 DIAGNOSIS — M51369 Other intervertebral disc degeneration, lumbar region without mention of lumbar back pain or lower extremity pain: Secondary | ICD-10-CM

## 2014-10-25 DIAGNOSIS — M19012 Primary osteoarthritis, left shoulder: Secondary | ICD-10-CM | POA: Diagnosis not present

## 2014-10-25 DIAGNOSIS — M5030 Other cervical disc degeneration, unspecified cervical region: Secondary | ICD-10-CM | POA: Insufficient documentation

## 2014-10-25 DIAGNOSIS — M533 Sacrococcygeal disorders, not elsewhere classified: Secondary | ICD-10-CM

## 2014-10-25 DIAGNOSIS — M461 Sacroiliitis, not elsewhere classified: Secondary | ICD-10-CM | POA: Diagnosis not present

## 2014-10-25 DIAGNOSIS — M5136 Other intervertebral disc degeneration, lumbar region: Secondary | ICD-10-CM

## 2014-10-25 NOTE — Progress Notes (Signed)
   Subjective:    Patient ID: Paula Frank, female    DOB: January 14, 1941, 74 y.o.   MRN: [id number]  Shoulder Pain  Associated symptoms include numbness.   Severe  Pain of  Left and right shoulders  With lower back spasms and pain of hips and buttocks  As well    Review of Systems  Neurological: Positive for weakness, numbness and headaches.  stiffness of shoulders and  hips  Weakness of upper extremities  Ans  Spasms of lower back and extremities     Objective:   Physical Exam  Neurological: She displays atrophy and tremor.  Decreased  EHL  Strength OF LOWER EXTREMITIES  DECREASED  ROM OF  SHOULDERS  TENDERNESS OF  ACROMIOCLAVICULAR JOINT TENDERNESS OF PSIS AND PIIS  REGIONS            Assessment & Plan:

## 2014-10-25 NOTE — Patient Instructions (Addendum)
Nothing to eat or drink 6 hours prior to appointment. Bring a driver with you because you will not be able to drive for 24 hours after your procedure. If you take B/P medications, please take them in the morning as you normally would. Joint Injection Care After Refer to this sheet in the next few days. These instructions provide you with information on caring for yourself after you have had a joint injection. Your caregiver also may give you more specific instructions. Your treatment has been planned according to current medical practices, but problems sometimes occur. Call your caregiver if you have any problems or questions after your procedure. After any type of joint injection, it is not uncommon to experience:  Soreness, swelling, or bruising around the injection site.  Mild numbness, tingling, or weakness around the injection site caused by the numbing medicine used before or with the injection. It also is possible to experience the following effects associated with the specific agent after injection:  Iodine-based contrast agents:  Allergic reaction (itching, hives, widespread redness, and swelling beyond the injection site).  Corticosteroids (These effects are rare.):  Allergic reaction.  Increased blood sugar levels (If you have diabetes and you notice that your blood sugar levels have increased, notify your caregiver).  Increased blood pressure levels.  Mood swings.  Hyaluronic acid in the use of viscosupplementation.  Temporary heat or redness.  Temporary rash and itching.  Increased fluid accumulation in the injected joint. These effects all should resolve within a day after your procedure.  HOME CARE INSTRUCTIONS  Limit yourself to light activity the day of your procedure. Avoid lifting heavy objects, bending, stooping, or twisting.  Take prescription or over-the-counter pain medication as directed by your caregiver.  You may apply ice to your injection site to  reduce pain and swelling the day of your procedure. Ice may be applied 03-04 times:  Put ice in a plastic bag.  Place a towel between your skin and the bag.  Leave the ice on for no longer than 15-20 minutes each time. SEEK IMMEDIATE MEDICAL CARE IF:   Pain and swelling get worse rather than better or extend beyond the injection site.  Numbness does not go away.  Blood or fluid continues to leak from the injection site.  You have chest pain.  You have swelling of your face or tongue.  You have trouble breathing or you become dizzy.  You develop a fever, chills, or severe tenderness at the injection site that last longer than 1 day. MAKE SURE YOU:  Understand these instructions.  Watch your condition.  Get help right away if you are not doing well or if you get worse. Document Released: 02/22/2011 Document Revised: 09/03/2011 Document Reviewed: 02/22/2011 [facility] Patient Information 2015 [place], [place]. This information is not intended to replace advice given to you by your health care provider. Make sure you discuss any questions you have with your health care provider.

## 2014-10-27 DIAGNOSIS — F438 Other reactions to severe stress: Secondary | ICD-10-CM | POA: Diagnosis not present

## 2014-10-27 DIAGNOSIS — F439 Reaction to severe stress, unspecified: Secondary | ICD-10-CM | POA: Diagnosis not present

## 2014-10-27 DIAGNOSIS — F332 Major depressive disorder, recurrent severe without psychotic features: Secondary | ICD-10-CM | POA: Diagnosis not present

## 2014-10-27 DIAGNOSIS — Z79899 Other long term (current) drug therapy: Secondary | ICD-10-CM | POA: Diagnosis not present

## 2014-10-31 DIAGNOSIS — M5137 Other intervertebral disc degeneration, lumbosacral region: Secondary | ICD-10-CM | POA: Insufficient documentation

## 2014-10-31 DIAGNOSIS — M51379 Other intervertebral disc degeneration, lumbosacral region without mention of lumbar back pain or lower extremity pain: Secondary | ICD-10-CM | POA: Insufficient documentation

## 2014-10-31 NOTE — Patient Instructions (Signed)
Continue present medications.  F/U PCP for evaluation of BP and general medical condition.  F/U neurological evaluation.  .F/U surgical evaluation.   May consider radiofrequency rhizolysis, intraspinal implantation, and other procedures  Patient to call [facility] for any concerns prior to scheduled appointment.  Patient is to call [facility] should the patient have concerns prior to return appointment.

## 2014-11-01 ENCOUNTER — Encounter: Payer: Medicare Other | Admitting: Pain Medicine

## 2014-11-01 NOTE — Addendum Note (Signed)
Addended by: Morley Kos on: 11/01/2014 05:39 PM   Modules accepted: Level of Service

## 2014-11-02 NOTE — Progress Notes (Signed)
This encounter was created in error - please disregard.

## 2014-11-03 ENCOUNTER — Other Ambulatory Visit: Payer: Self-pay | Admitting: Family Medicine

## 2014-11-03 ENCOUNTER — Ambulatory Visit
Admission: RE | Admit: 2014-11-03 | Discharge: 2014-11-03 | Disposition: A | Discharge status: Home / Self Care | Payer: Medicare Other | Source: Ambulatory Visit | Attending: Family Medicine | Admitting: Family Medicine

## 2014-11-03 DIAGNOSIS — M797 Fibromyalgia: Secondary | ICD-10-CM | POA: Diagnosis not present

## 2014-11-03 DIAGNOSIS — M19072 Primary osteoarthritis, left ankle and foot: Secondary | ICD-10-CM | POA: Diagnosis not present

## 2014-11-03 DIAGNOSIS — M25572 Pain in left ankle and joints of left foot: Secondary | ICD-10-CM

## 2014-11-03 DIAGNOSIS — R197 Diarrhea, unspecified: Secondary | ICD-10-CM | POA: Diagnosis not present

## 2014-11-03 DIAGNOSIS — L309 Dermatitis, unspecified: Secondary | ICD-10-CM | POA: Diagnosis not present

## 2014-11-03 DIAGNOSIS — M329 Systemic lupus erythematosus, unspecified: Secondary | ICD-10-CM | POA: Diagnosis not present

## 2014-11-03 DIAGNOSIS — Z9181 History of falling: Secondary | ICD-10-CM | POA: Diagnosis not present

## 2014-11-03 DIAGNOSIS — M25472 Effusion, left ankle: Secondary | ICD-10-CM | POA: Diagnosis not present

## 2014-11-03 DIAGNOSIS — R634 Abnormal weight loss: Secondary | ICD-10-CM | POA: Diagnosis not present

## 2014-11-03 DIAGNOSIS — R000 Tachycardia, unspecified: Secondary | ICD-10-CM | POA: Diagnosis not present

## 2014-11-03 DIAGNOSIS — G40909 Epilepsy, unspecified, not intractable, without status epilepticus: Secondary | ICD-10-CM | POA: Diagnosis not present

## 2014-11-04 DIAGNOSIS — M1990 Unspecified osteoarthritis, unspecified site: Secondary | ICD-10-CM | POA: Diagnosis not present

## 2014-11-04 DIAGNOSIS — I739 Peripheral vascular disease, unspecified: Secondary | ICD-10-CM | POA: Diagnosis not present

## 2014-11-04 DIAGNOSIS — M7989 Other specified soft tissue disorders: Secondary | ICD-10-CM | POA: Diagnosis not present

## 2014-11-04 DIAGNOSIS — M79609 Pain in unspecified limb: Secondary | ICD-10-CM | POA: Diagnosis not present

## 2014-11-04 DIAGNOSIS — I10 Essential (primary) hypertension: Secondary | ICD-10-CM | POA: Diagnosis not present

## 2014-11-04 DIAGNOSIS — I890 Lymphedema, not elsewhere classified: Secondary | ICD-10-CM | POA: Diagnosis not present

## 2014-11-04 DIAGNOSIS — R1031 Right lower quadrant pain: Secondary | ICD-10-CM | POA: Diagnosis not present

## 2014-11-04 DIAGNOSIS — E785 Hyperlipidemia, unspecified: Secondary | ICD-10-CM | POA: Diagnosis not present

## 2014-11-10 ENCOUNTER — Emergency Department
Admission: EM | Admit: 2014-11-10 | Discharge: 2014-11-10 | Disposition: A | Discharge status: Home / Self Care | Payer: Medicare Other | Attending: Emergency Medicine | Admitting: Emergency Medicine

## 2014-11-10 DIAGNOSIS — R000 Tachycardia, unspecified: Secondary | ICD-10-CM | POA: Insufficient documentation

## 2014-11-10 DIAGNOSIS — N189 Chronic kidney disease, unspecified: Secondary | ICD-10-CM | POA: Diagnosis not present

## 2014-11-10 DIAGNOSIS — Z79899 Other long term (current) drug therapy: Secondary | ICD-10-CM | POA: Insufficient documentation

## 2014-11-10 DIAGNOSIS — L03116 Cellulitis of left lower limb: Secondary | ICD-10-CM

## 2014-11-10 DIAGNOSIS — R634 Abnormal weight loss: Secondary | ICD-10-CM

## 2014-11-10 DIAGNOSIS — R197 Diarrhea, unspecified: Secondary | ICD-10-CM | POA: Diagnosis not present

## 2014-11-10 DIAGNOSIS — Z720 Tobacco use: Secondary | ICD-10-CM | POA: Diagnosis not present

## 2014-11-10 DIAGNOSIS — F419 Anxiety disorder, unspecified: Secondary | ICD-10-CM | POA: Insufficient documentation

## 2014-11-10 DIAGNOSIS — R51 Headache: Secondary | ICD-10-CM | POA: Insufficient documentation

## 2014-11-10 LAB — CBC WITH DIFFERENTIAL/PLATELET
Basophils Absolute: 0.1 10*3/uL (ref 0–0.1)
Basophils Relative: 1 %
EOS PCT: 1 %
Eosinophils Absolute: 0.1 10*3/uL (ref 0–0.7)
HEMATOCRIT: 41.8 % (ref 35.0–47.0)
Hemoglobin: 13.4 g/dL (ref 12.0–16.0)
LYMPHS PCT: 24 %
Lymphs Abs: 1.8 10*3/uL (ref 1.0–3.6)
MCH: 32 pg (ref 26.0–34.0)
MCHC: 32.2 g/dL (ref 32.0–36.0)
MCV: 99.6 fL (ref 80.0–100.0)
MONO ABS: 0.7 10*3/uL (ref 0.2–0.9)
Monocytes Relative: 10 %
Neutro Abs: 4.8 10*3/uL (ref 1.4–6.5)
Neutrophils Relative %: 64 %
Platelets: 380 10*3/uL (ref 150–440)
RBC: 4.2 MIL/uL (ref 3.80–5.20)
RDW: 14.6 % — ABNORMAL HIGH (ref 11.5–14.5)
WBC: 7.5 10*3/uL (ref 3.6–11.0)

## 2014-11-10 LAB — COMPREHENSIVE METABOLIC PANEL
ALT: 21 U/L (ref 14–54)
AST: 31 U/L (ref 15–41)
Albumin: 3.9 g/dL (ref 3.5–5.0)
Alkaline Phosphatase: 126 U/L (ref 38–126)
Anion gap: 9 (ref 5–15)
BUN: 34 mg/dL — ABNORMAL HIGH (ref 6–20)
CALCIUM: 10.1 mg/dL (ref 8.9–10.3)
CO2: 29 mmol/L (ref 22–32)
CREATININE: 0.78 mg/dL (ref 0.44–1.00)
Chloride: 102 mmol/L (ref 101–111)
GLUCOSE: 105 mg/dL — AB (ref 65–99)
Potassium: 5.4 mmol/L — ABNORMAL HIGH (ref 3.5–5.1)
Sodium: 140 mmol/L (ref 135–145)
TOTAL PROTEIN: 7.9 g/dL (ref 6.5–8.1)
Total Bilirubin: 0.5 mg/dL (ref 0.3–1.2)

## 2014-11-10 LAB — TSH: TSH: 1.798 u[IU]/mL (ref 0.350–4.500)

## 2014-11-10 MED ORDER — DIPHENOXYLATE-ATROPINE 2.5-0.025 MG PO TABS: 1.0000 | ORAL_TABLET | Freq: Four times a day (QID) | ORAL | Status: AC | PRN

## 2014-11-10 MED ORDER — SODIUM CHLORIDE 0.9 % IV BOLUS (SEPSIS)
500.0000 mL | Freq: Once | INTRAVENOUS | Status: AC
  Administered 2014-11-10: 500 mL via INTRAVENOUS

## 2014-11-10 MED ORDER — CEPHALEXIN 250 MG PO CAPS: 250.0000 mg | ORAL_CAPSULE | Freq: Four times a day (QID) | ORAL | Status: AC

## 2014-11-10 MED ORDER — ONDANSETRON HCL 4 MG/2ML IJ SOLN
4.0000 mg | Freq: Once | INTRAMUSCULAR | Status: AC
  Administered 2014-11-10: 4 mg via INTRAVENOUS

## 2014-11-10 MED ORDER — ONDANSETRON HCL 4 MG/2ML IJ SOLN
INTRAMUSCULAR | Status: AC
  Administered 2014-11-10: 4 mg via INTRAVENOUS
  Filled 2014-11-10: qty 2

## 2014-11-10 NOTE — ED Provider Notes (Signed)
[facility] Emergency Department Provider Note  ____________________________________________  Time seen: Approximately 12:40 PM  I have reviewed the triage vital signs and the nursing notes.   HISTORY  Chief Complaint Headache and Diarrhea    HPI Paula Frank is a 74 y.o. female cyst from her PCP secondary to headache and diarrhea. Patient complained of a frontal headache however she's had diarrhea for the last 24 hours stay she does know how many bowel movements she hadn't been so many had 1 just prior to coming to the ER today. Patient's concern about 14 pound weight loss t since January of this year.Patient states she has nausea but no vomiting. Rates her pain and discomfort as 7/10 but did not point anyplace is really bothering her.   Past Medical History  Diagnosis Date  . Lupus (systemic lupus erythematosus)   . Discoid lupus   . Gastritis   . Chronic pain   . Restless leg   . Anxiety   . Diverticulitis   . Chronic kidney disease   . Seizures     Patient Active Problem List   Diagnosis Date Noted  . DDD (degenerative disc disease), lumbosacral 10/31/2014  . 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 DDD (degenerative disc disease), cervical 10/25/2014  . DDD (degenerative disc disease), lumbar 10/25/2014    Past Surgical History  Procedure Laterality Date  . Tubal ligation    . Abdominal hysterectomy    . Laparoscopy    . Rotator cuff repair    . Lithotripsy    . Knee arthroscopy w/ meniscectomy    . Appendectomy    . Rotator cuff repair Bilateral     x2    Current Outpatient Rx  Name  Route  Sig  Dispense  Refill  . Armodafinil (NUVIGIL) 250 MG tablet   Oral   Take 250 mg by mouth daily.         . cephALEXin (KEFLEX) 250 MG capsule   Oral   Take 1 capsule (250 mg total) by mouth 4 (four) times daily.   40 capsule   0   . diazepam (VALIUM) 5 MG tablet   Oral    Take by mouth.         . diphenhydrAMINE (BENADRYL) 25 mg capsule            0   . diphenoxylate-atropine (LOMOTIL) 2.5-0.025 MG per tablet   Oral   Take 1 tablet by mouth 4 (four) times daily as needed for diarrhea or loose stools.   8 tablet   0   . divalproex (DEPAKOTE ER) 250 MG 24 hr tablet   Oral   Take by mouth.         . divalproex (DEPAKOTE) 125 MG DR tablet   Oral   Take 125 mg by mouth 2 (two) times daily.         Marland Kitchen doxepin (SINEQUAN) 25 MG capsule   Oral   Take by mouth.         . DULoxetine (CYMBALTA) 60 MG capsule   Oral   Take 60 mg by mouth daily.         Marland Kitchen gabapentin (NEURONTIN) 100 MG capsule   Oral   Take 200 mg by mouth 2 (two) times daily.         . hydrOXYzine (VISTARIL) 25 MG capsule   Oral   Take by mouth.         Marland Kitchen  levETIRAcetam (KEPPRA) 500 MG tablet   Oral   Take 500 mg by mouth 2 (two) times daily.         . montelukast (SINGULAIR) 10 MG tablet   Oral   Take by mouth.         . ondansetron (ZOFRAN) 4 MG tablet   Oral   Take 4 mg by mouth every 4 (four) hours.         Marland Kitchen oxyCODONE-acetaminophen (PERCOCET/ROXICET) 5-325 MG per tablet   Oral   Take by mouth.         . permethrin (ELIMITE) 5 % cream   Topical   Apply topically.         Marland Kitchen QUEtiapine (SEROQUEL) 400 MG tablet   Oral   Take 400 mg by mouth at bedtime.         Marland Kitchen QUEtiapine (SEROQUEL) 400 MG tablet      TAKE ONE TABLET BY MOUTH AT BEDTIME         . ranitidine (ZANTAC) 150 MG tablet   Oral   Take by mouth.         Marland Kitchen rOPINIRole (REQUIP) 1 MG tablet      Take 1 mg in the morning and 2 mg at night         . rOPINIRole (REQUIP) 2 MG tablet   Oral   Take 2 mg by mouth at bedtime.         . triamcinolone cream (KENALOG) 0.5 %   Topical   Apply topically.         . Vortioxetine HBr 10 MG TABS   Oral   Take by mouth.           Allergies Ivp dye; Levaquin; Levofloxacin; Moxifloxacin hcl in nacl; and Sulfa  antibiotics  Family History  Problem Relation Age of Onset  . Cancer Son   . Cancer Mother   . Esophageal cancer Father   . Breast cancer Sister     Social History History  Substance Use Topics  . Smoking status: Current Every Day Smoker -- 1.00 packs/day for 60 years    Types: Cigarettes  . Smokeless tobacco: Never Used  . Alcohol Use: 4.2 - 8.4 oz/week    7-14 Standard drinks or equivalent per week     Comment: 1-2 glass of wine nightly.     Review of Systems Constitutional: No fever/chills Eyes: No visual changes. ENT: No sore throat. Cardiovascular: Denies chest pain. Respiratory: Denies shortness of breath. Gastrointestinal: No abdominal pain. Positive nausea, no vomiting.  Positive for diarrhea.  No constipation. Genitourinary: Negative for dysuria. Musculoskeletal: Negative for back pain. Skin: Negative for rash. Neurological: Negative for headaches, focal weakness or numbness. Psychiatric:anxiety Endocrine:Lupus Hematological/Lymphatic: Allergic/Immunilogical: 10-point ROS otherwise negative.  ____________________________________________   PHYSICAL EXAM:  VITAL SIGNS: ED Triage Vitals  Enc Vitals Group     BP 11/10/14 1230 126/61 mmHg     Pulse Rate 11/10/14 1230 113     Resp 11/10/14 1230 20     Temp 11/10/14 1230 98.5 F (36.9 C)     Temp Source 11/10/14 1230 Oral     SpO2 11/10/14 1230 98 %     Weight 11/10/14 1230 114 lb (51.71 kg)     Height 11/10/14 1230 5\' 6"  (1.676 m)     Head Cir --      Peak Flow --      Pain Score 11/10/14 1230 7     Pain Loc --  Pain Edu? --      Excl. in [place]? --    Constitutional: Alert and oriented. Well appearing and in no acute distress. Eyes: Conjunctivae are normal. PERRL. EOMI. Head: Atraumatic. Nose: No congestion/rhinnorhea. Mouth/Throat: Mucous membranes are moist.  Oropharynx non-erythematous. Neck: No stridor.   Hematological/Lymphatic/Immunilogical: No cervical lymphadenopathy. Cardiovascular:  Tachycardic at 120 beats for minute. Grossly normal heart sounds.  Good peripheral circulation. Respiratory: Normal respiratory effort.  No retractions. Lungs CTAB. Gastrointestinal: Soft and nontender. No distention. No abdominal bruits. No CVA tenderness.  Musculoskeletal: No lower extremity tenderness nor edema.  No joint effusions. Neurologic:  Normal speech and language. No gross focal neurologic deficits are appreciated. Speech is normal. No gait instability. Skin:  Skin is warm, dry and intact. No rash noted. Psychiatric: Mood and affect are normal. Speech and behavior are normal.  ____________________________________________   LABS (all labs ordered are listed, but only abnormal results are displayed)  Labs Reviewed  CBC WITH DIFFERENTIAL/PLATELET - Abnormal; Notable for the following:    RDW 14.6 (*)    All other components within normal limits  COMPREHENSIVE METABOLIC PANEL - Abnormal; Notable for the following:    Potassium 5.4 (*)    Glucose, Bld 105 (*)    BUN 34 (*)    All other components within normal limits  TSH  URINALYSIS COMPLETEWITH MICROSCOPIC ([facility])    ____________________________________________  EKG  ____________________________________________  RADIOLOGY   ____________________________________________   PROCEDURES  Procedure(s) performed: None  Critical Care performed: No  ____________________________________________   INITIAL IMPRESSION / ASSESSMENT AND PLAN / ED COURSE  Pertinent labs & imaging results that were available during my care of the patient were reviewed by me and considered in my medical decision making (see chart for details).  Diarrhea ____________________________________________   FINAL CLINICAL IMPRESSION(S) / ED DIAGNOSES  Final diagnoses:  Diarrhea in adult patient  Unexplained weight loss  Cellulitis of left lower extremity      Sable Feil, PA-C 11/10/14 1440

## 2014-11-10 NOTE — Discharge Instructions (Signed)
Take medications as directed.  Follow up with Family doctor for complaint of losing weight. Your lab today showed no acute findings for that compliant.

## 2014-11-10 NOTE — ED Notes (Signed)
HEADACHE and diarrhea x1 day, sent by PCP

## 2014-12-20 DIAGNOSIS — L299 Pruritus, unspecified: Secondary | ICD-10-CM | POA: Diagnosis not present

## 2014-12-25 DIAGNOSIS — R21 Rash and other nonspecific skin eruption: Secondary | ICD-10-CM | POA: Diagnosis not present

## 2014-12-30 DIAGNOSIS — L299 Pruritus, unspecified: Secondary | ICD-10-CM | POA: Diagnosis not present

## 2015-01-03 DIAGNOSIS — L299 Pruritus, unspecified: Secondary | ICD-10-CM | POA: Diagnosis not present

## 2015-01-05 DIAGNOSIS — F332 Major depressive disorder, recurrent severe without psychotic features: Secondary | ICD-10-CM | POA: Diagnosis not present

## 2015-01-05 DIAGNOSIS — F438 Other reactions to severe stress: Secondary | ICD-10-CM | POA: Diagnosis not present

## 2015-01-13 ENCOUNTER — Telehealth: Payer: Self-pay

## 2015-01-13 NOTE — Telephone Encounter (Signed)
Patient was seen by Dermatology like we had recommended and rash is still severe. No cremes or meds are helping. She thinks she is used to [place] and it is not helping now. She says it is in her nostrils and near eyes and in private area. She did clean house and no one else in home is getting this 4 week rash so she does not think nor does dermatology, that it is scabies. She says she has a bite at ankle that she thinks may have started this and also thinks it could be allergic reaction to a spider bite or tick though she has not seen either of these on her. She wants Rx for medicine to stop itch. I scheduled her at 1:00 Friday and she knows sometimes we get back from lunch at 1:10. She also knows we will not see her for anything else that day and since we are working her in so fast that we will stick to the rash issue. [facility]

## 2015-01-14 ENCOUNTER — Ambulatory Visit: Payer: Self-pay | Admitting: Family Medicine

## 2015-01-14 ENCOUNTER — Telehealth: Payer: Self-pay

## 2015-01-14 NOTE — Telephone Encounter (Signed)
Ok.  But I would not charge her for this since the appt. Was just made <24 hrs ago.-jh

## 2015-01-14 NOTE — Telephone Encounter (Signed)
Ok-jh 

## 2015-01-14 NOTE — Telephone Encounter (Signed)
When called to ask her to being all meds she said she decided to cancel appt. She said she wants to take a nap. I asked her why she did not call sooner she said she did. I asked who she spoke to and where she called since no one here got a call and she said she has no idea where and began whining about how tired she is and how she has had nausea and vomiting and wants to be left alone. I explained that we made special arrangements after our one hour conversation and she said she knows but what do ya do...ibuprofen advised she will be charged when not calling 24 hrs in advance to cancel. [facility]

## 2015-01-20 DIAGNOSIS — F332 Major depressive disorder, recurrent severe without psychotic features: Secondary | ICD-10-CM | POA: Diagnosis not present

## 2015-01-20 DIAGNOSIS — F438 Other reactions to severe stress: Secondary | ICD-10-CM | POA: Diagnosis not present

## 2015-01-26 DIAGNOSIS — F439 Reaction to severe stress, unspecified: Secondary | ICD-10-CM | POA: Diagnosis not present

## 2015-01-26 DIAGNOSIS — F438 Other reactions to severe stress: Secondary | ICD-10-CM | POA: Diagnosis not present

## 2015-01-26 DIAGNOSIS — F332 Major depressive disorder, recurrent severe without psychotic features: Secondary | ICD-10-CM | POA: Diagnosis not present

## 2015-01-27 ENCOUNTER — Telehealth: Payer: Self-pay | Admitting: Family Medicine

## 2015-01-27 DIAGNOSIS — R110 Nausea: Secondary | ICD-10-CM

## 2015-01-27 NOTE — Telephone Encounter (Signed)
Pt called requesting a refill on Ondansetron hcl  4 mg pt call  Back # is  [phone]

## 2015-01-27 NOTE — Telephone Encounter (Signed)
Patient called upset about needing refill on Zofran. I explained Dr. Luan Pulling is out of office on Thursday afternoons. She started yelling in phone that she called in morning. I got message at 1:54 and I told her that we also have clinic in morning and require at least 24 hour for refills and she screamed saying she is out of them now. I told her that it is best to remember to call when she gets low or before running out. She said in rude manor for me not to give her a lecture. She started arguing about how hot it is and how she needs this med right now and I tried to let her know in nice way that I will discuss with NP and she said absolutley not and that she did not care who was needing seen here this morning she should be important and she said she has had it and I said sorry and asked if there is any way she can make it until tomorrow or at least let me talk to NP and she started yelling things I could not understand. I then stated I am not going to argue and I am willing to help but can not if she does not find a way to calm down. She hung up.

## 2015-01-28 MED ORDER — ONDANSETRON HCL 4 MG PO TABS: 4.0000 mg | ORAL_TABLET | Freq: Three times a day (TID) | ORAL | Status: DC | PRN

## 2015-01-28 NOTE — Telephone Encounter (Signed)
You may fill Zofran 4 mg, #10, to be taken up to every 8 hrs prn nausea.  To be taken by patient only.-jh

## 2015-01-28 NOTE — Addendum Note (Signed)
Addended by: Theresia Majors A on: 01/28/2015 01:33 PM   Modules accepted: Orders, Medications

## 2015-01-31 DIAGNOSIS — L299 Pruritus, unspecified: Secondary | ICD-10-CM | POA: Diagnosis not present

## 2015-01-31 DIAGNOSIS — L309 Dermatitis, unspecified: Secondary | ICD-10-CM | POA: Diagnosis not present

## 2015-02-03 DIAGNOSIS — F332 Major depressive disorder, recurrent severe without psychotic features: Secondary | ICD-10-CM | POA: Diagnosis not present

## 2015-02-03 DIAGNOSIS — F438 Other reactions to severe stress: Secondary | ICD-10-CM | POA: Diagnosis not present

## 2015-02-08 ENCOUNTER — Telehealth: Payer: Self-pay | Admitting: Family Medicine

## 2015-02-08 NOTE — Telephone Encounter (Signed)
Dr. Bubba Camp from [facility] D[facility]at[facility] request you call her at  [phone]

## 2015-02-11 ENCOUNTER — Other Ambulatory Visit: Payer: Self-pay | Admitting: Family Medicine

## 2015-02-16 ENCOUNTER — Encounter: Payer: Self-pay | Admitting: Emergency Medicine

## 2015-02-16 ENCOUNTER — Emergency Department: Payer: Medicare Other

## 2015-02-16 ENCOUNTER — Emergency Department
Admission: EM | Admit: 2015-02-16 | Discharge: 2015-02-16 | Disposition: A | Discharge status: Home / Self Care | Payer: Medicare Other | Attending: Emergency Medicine | Admitting: Emergency Medicine

## 2015-02-16 DIAGNOSIS — S0990XA Unspecified injury of head, initial encounter: Secondary | ICD-10-CM | POA: Diagnosis not present

## 2015-02-16 DIAGNOSIS — S4991XA Unspecified injury of right shoulder and upper arm, initial encounter: Secondary | ICD-10-CM | POA: Diagnosis present

## 2015-02-16 DIAGNOSIS — F314 Bipolar disorder, current episode depressed, severe, without psychotic features: Secondary | ICD-10-CM

## 2015-02-16 DIAGNOSIS — Y998 Other external cause status: Secondary | ICD-10-CM | POA: Diagnosis not present

## 2015-02-16 DIAGNOSIS — F1010 Alcohol abuse, uncomplicated: Secondary | ICD-10-CM | POA: Diagnosis not present

## 2015-02-16 DIAGNOSIS — R4182 Altered mental status, unspecified: Secondary | ICD-10-CM | POA: Diagnosis not present

## 2015-02-16 DIAGNOSIS — W1839XA Other fall on same level, initial encounter: Secondary | ICD-10-CM | POA: Diagnosis not present

## 2015-02-16 DIAGNOSIS — Z79899 Other long term (current) drug therapy: Secondary | ICD-10-CM | POA: Diagnosis not present

## 2015-02-16 DIAGNOSIS — Z720 Tobacco use: Secondary | ICD-10-CM | POA: Insufficient documentation

## 2015-02-16 DIAGNOSIS — Y92008 Other place in unspecified non-institutional (private) residence as the place of occurrence of the external cause: Secondary | ICD-10-CM | POA: Insufficient documentation

## 2015-02-16 DIAGNOSIS — M25511 Pain in right shoulder: Secondary | ICD-10-CM | POA: Diagnosis not present

## 2015-02-16 DIAGNOSIS — G309 Alzheimer's disease, unspecified: Secondary | ICD-10-CM

## 2015-02-16 DIAGNOSIS — Y9389 Activity, other specified: Secondary | ICD-10-CM | POA: Diagnosis not present

## 2015-02-16 DIAGNOSIS — F0280 Dementia in other diseases classified elsewhere without behavioral disturbance: Secondary | ICD-10-CM

## 2015-02-16 DIAGNOSIS — N189 Chronic kidney disease, unspecified: Secondary | ICD-10-CM | POA: Insufficient documentation

## 2015-02-16 LAB — ETHANOL: ALCOHOL ETHYL (B): 6 mg/dL — AB (ref <5)

## 2015-02-16 MED ORDER — ONDANSETRON 4 MG PO TBDP: 4.0000 mg | ORAL_TABLET | Freq: Once | ORAL | Status: DC

## 2015-02-16 MED ORDER — IBUPROFEN 400 MG PO TABS: 400.0000 mg | ORAL_TABLET | Freq: Once | ORAL | Status: DC

## 2015-02-16 NOTE — ED Notes (Signed)
Dr. Brown at bedside

## 2015-02-16 NOTE — BHH Counselor (Signed)
Per the request of patient's nurse Estill Bamberg), writer called patient's support ([phone]), her son Christia Reading) wasn't available. Per [name], he plays the guitar with a band and he doesn't know when he will return. However, [name] will come and get the patient. Writer updated the patient's nurse Estill Bamberg). Nothing else needed at this time.

## 2015-02-16 NOTE — ED Notes (Addendum)
Per EMS family requesting for patient to have psych evaluation done at hospital. Patient reports having right shoulder chronic pain, had surgery on rotator cuff, patient rates pain 6/10.
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
## 2015-02-16 NOTE — BH Assessment (Signed)
Assessment Note  Paula Frank is an 74 y.o. female who presents to ER, via EMS, due to family having concerns about her falling and not being able to get back up. According to the patient, she was unsure why she was in the ER. She admits to drinking on a daily basis. She states she "have a little tonic(vodka) with my dinner." She also reports of having some depression, due to the passing of one of her son's. He died approximately 5 years ago.  She denies SI/HI and AV/H.  According to the son ([phone]), he was concerned, due to the patient not being able get up, after falling. He states, he believes, it is due to her not eating in 3 days. Also believes it is due to her not taking her medications late at night and she is still drowsy from them. On top of that, she has ongoing use of alcohol. Family has had the patient placed with [facility] in the past, but due to her throwing things at staff, she was brought back home. When the patient doesn't get her alcohol, she become upset and can be combative.  Son voiced frustration due to the patient "knows better." Patient is a retired Copywriter, advertising."   Axis I: Bipolar, Depressed Axis III:  Past Medical History  Diagnosis Date  . Lupus (systemic lupus erythematosus)   . Discoid lupus   . Gastritis   . Chronic pain   . Restless leg   . Anxiety   . Diverticulitis   . Chronic kidney disease   . Seizures    Axis IV: other psychosocial or environmental problems  Past Medical History:  Past Medical History  Diagnosis Date  . Lupus (systemic lupus erythematosus)   . Discoid lupus   . Gastritis   . Chronic pain   . Restless leg   . Anxiety   . Diverticulitis   . Chronic kidney disease   . Seizures     Past Surgical History  Procedure Laterality Date  . Tubal ligation    . Abdominal hysterectomy    . Laparoscopy    . Rotator cuff repair    . Lithotripsy    . Knee arthroscopy w/ meniscectomy    . Appendectomy    .  Rotator cuff repair Bilateral     x2    Family History:  Family History  Problem Relation Age of Onset  . Cancer Son   . Cancer Mother   . Esophageal cancer Father   . Breast cancer Sister     Social History:  reports that she has been smoking Cigarettes.  She has a 60 pack-year smoking history. She has never used smokeless tobacco. She reports that she drinks about 4.2 - 8.4 oz of alcohol per week. She reports that she does not use illicit drugs.  Additional Social History:  Alcohol / Drug Use Pain Medications: None Reported Prescriptions: None Reported Over the Counter: None Reported History of alcohol / drug use?: Yes Substance #1 Name of Substance 1: Alcohol 1 - Age of First Use: Unknown 1 - Amount (size/oz): "Just a little Vodka" 1 - Frequency: Daily-"Just with my dinner" 1 - Duration: Per son, increase within the last year" 1 - Last Use / Amount: 02/15/2015  CIWA: CIWA-Ar BP: (!) 148/89 mmHg Pulse Rate: 73 COWS:    Allergies:  Allergies  Allergen Reactions  . Amoxicillin Other (See Comments)  . Ioxaglate     Other reaction(s): Unknown  . Ivp Dye [  Iodinated Diagnostic Agents]     Other reaction(s): Unknown  . Levaquin [Levofloxacin In D5w]   . Levofloxacin     Other reaction(s): Unknown  . Moxifloxacin Hcl In Nacl     Other reaction(s): Unknown  . Erythromycin Rash    Canker sore  . Sulfa Antibiotics Rash    Other reaction(s): Unknown    Home Medications:  (Not in a hospital admission)  OB/GYN Status:  No LMP recorded. Patient has had a hysterectomy.  General Assessment Data Location of Assessment: [facility] ED TTS Assessment: In system Is this a Tele or Face-to-Face Assessment?: Face-to-Face Is this an Initial Assessment or a Re-assessment for this encounter?: Initial Assessment Marital status: Long term relationship Maiden name: n/a Is patient pregnant?: No Pregnancy Status: No Living Arrangements: Spouse/significant other, Children Can pt return  to current living arrangement?: No Admission Status: Voluntary Is patient capable of signing voluntary admission?: Yes Referral Source: Self/Family/Friend Insurance type: Medicare  Medical Screening Exam ([place]) Medical Exam completed: Yes  Crisis Care Plan Living Arrangements: Spouse/significant other, Children Name of Psychiatrist: Unknown (Patient didn't remember the name ) Name of Therapist: none  Education Status Is patient currently in school?: No Current Grade: n/a Highest grade of school patient has completed: [place] Name of school: n/a Contact person: n/a  Risk to self with the past 6 months Suicidal Ideation: No Has patient been a risk to self within the past 6 months prior to admission? : No Suicidal Intent: No Has patient had any suicidal intent within the past 6 months prior to admission? : No Is patient at risk for suicide?: No Suicidal Plan?: No Has patient had any suicidal plan within the past 6 months prior to admission? : No Access to Means: No What has been your use of drugs/alcohol within the last 12 months?: Alocohol Previous Attempts/Gestures: No How many times?: 0 Other Self Harm Risks: 0 Triggers for Past Attempts: None known Intentional Self Injurious Behavior: None Family Suicide History: Unknown Recent stressful life event(s): Loss (Comment) (One of her sons died, 5 years ago.) Persecutory voices/beliefs?: No Depression: Yes Depression Symptoms: Tearfulness, Fatigue, Feeling worthless/self pity Substance abuse history and/or treatment for substance abuse?: Yes (Alochol) Suicide prevention information given to non-admitted patients: Not applicable  Risk to Others within the past 6 months Homicidal Ideation: No Does patient have any lifetime risk of violence toward others beyond the six months prior to admission? : No Thoughts of Harm to Others: No Current Homicidal Intent: No Current Homicidal Plan: No Access to Homicidal Means:  No Identified Victim: None Reported History of harm to others?: No Assessment of Violence: None Noted Violent Behavior Description: Per son, she throws things when she don't get to drink alcohol Does patient have access to weapons?: No Criminal Charges Pending?: No Does patient have a court date: No Is patient on probation?: No  Psychosis Hallucinations: None noted Delusions: None noted  Mental Status Report Appearance/Hygiene: In hospital gown Eye Contact: Fair Motor Activity: Unable to assess (Patient was laying in the bed) Speech: Logical/coherent, Slow Level of Consciousness: Alert Mood: Depressed, Anxious Affect: Appropriate to circumstance, Depressed Anxiety Level: Minimal Thought Processes: Coherent, Relevant Judgement: Unimpaired Orientation: Person, Place, Time, Appropriate for developmental age, Situation Obsessive Compulsive Thoughts/Behaviors: None  Cognitive Functioning Concentration: Normal Memory: Recent Intact, Remote Intact IQ: Average Insight: Fair Impulse Control: Poor Appetite: Fair Weight Loss: 0 Weight Gain: 0 Sleep: No Change Total Hours of Sleep: 8 Vegetative Symptoms: None  ADLScreening [facility] Assessment Services) Patient's  cognitive ability adequate to safely complete daily activities?: Yes Patient able to express need for assistance with ADLs?: Yes Independently performs ADLs?: Yes (appropriate for developmental age)  Prior Inpatient Therapy Prior Inpatient Therapy: No Prior Therapy Dates: n/a Prior Therapy Facilty/Provider(s): n/a Reason for Treatment: n/a  Prior Outpatient Therapy Prior Outpatient Therapy: Yes Prior Therapy Dates: Current Prior Therapy Facilty/Provider(s): [facility] [place], [place]) Reason for Treatment: Bipolar Does patient have an ACCT team?: No Does patient have Intensive In-House Services?  : No Does patient have [facility] services? : No Does patient have P4CC services?: No  ADL Screening  (condition at time of admission) Patient's cognitive ability adequate to safely complete daily activities?: Yes Patient able to express need for assistance with ADLs?: Yes Independently performs ADLs?: Yes (appropriate for developmental age)       Abuse/Neglect Assessment (Assessment to be complete while patient is alone) Physical Abuse: Denies Verbal Abuse: Denies Sexual Abuse: Denies Exploitation of patient/patient's resources: Denies Self-Neglect: Denies Values / Beliefs Cultural Requests During Hospitalization: None Spiritual Requests During Hospitalization: None Consults Spiritual Care Consult Needed: No Social Work Consult Needed: No Regulatory affairs officer (For Healthcare) Does patient have an advance directive?: Yes Does patient want to make changes to advanced directive?: No - Patient declined Copy of advanced directive(s) in chart?: No - copy requested    Additional Information 1:1 In Past 12 Months?: No CIRT Risk: No Elopement Risk: No Does patient have medical clearance?: Yes  Child/Adolescent Assessment Running Away Risk: Denies (Patient is an adult)  Disposition:  Disposition Initial Assessment Completed for this Encounter: Yes Disposition of Patient: Other dispositions (Psych Consult ordered) Other disposition(s): Other (Comment) (Psych Consult ordered)  On Site Evaluation by:   Reviewed with Physician:    Gunnar Fusi, MS, LCAS, LPC, [place], CCSI 02/16/2015 3:06 PM

## 2015-02-16 NOTE — Consult Note (Signed)
[place] [facility] Consult   Reason for Consult:  Consult for patient brought in voluntarily by family because of a fall. Concern about behavior and alcohol use Referring Physician:  Joni Fears Patient Identification: Paula Frank MRN:  [id number] Principal Diagnosis: Alcohol abuse Diagnosis:   Patient Active Problem List   Diagnosis Date Noted  . Bipolar disorder, current episode hypomanic [F31.0] 02/16/2015  . Alcohol abuse [F10.10] 02/16/2015  . 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 DDD (degenerative disc disease), lumbar [M51.36] 10/25/2014    Total Time spent with patient: 1 hour  Subjective:   Paula Frank is a 74 y.o. female patient admitted with "I'm just not sure why I'm here".  HPI:  74 year old woman brought in by family after a fall. No evidence of acute injury. Patient doesn't remember why she is in the hospital. She says that her mood has been feeling more or less okay recently. She can't give a clear description of it being up or down although she does say that she thinks she's been more energetic and agitated lately. She says she sleeps okay as long as she takes her medicine which is Seroquel 400 mg at night. Denies suicidal or homicidal ideation. Denies ongoing hallucinations but says a couple weeks ago she had an episode where she got confused and thought someone else was in the house for a while but it resolved. She does admit that she drinks every day. Drinks "2 large drinks" every day which is more than she used to. Denies that she is abusing other drugs. Got started on Wellbutrin just a couple weeks ago she thinks but otherwise has been stable on the Seroquel for a while. Denies suicidal  thoughts.  Past psychiatric history: Patient says she's had several prior psychiatric hospitalizations. She claims that she was only ever told she had depression but son apparently says that she has a diagnosis of bipolar disorder. Patient says that lithium was once prescribed and was very helpful she doesn't know why she doesn't take it anymore. She describes a very distant history of a suicide attempt by overdose but nothing more recent than that.  Social history: Lives with her son and a friend of hers.  Family history: Sounds acute mania been positive for alcohol abuse and apparent.  Medical history: Patient has a history of chronic pain degenerative disc disease.  Substance abuse history: Denies ever having had a seizure or delirium tremens. Says that her alcohol use goes up and down depending on her mood. Denies any history of abusing any other drugs. HPI Elements:   Quality:  Agitation possibly confusion alcohol abuse. Severity:  Moderate and possibly threatening of an injury. Timing:  Seems like it may been worse the last couple weeks. Duration:  Ongoing but resolved right now. Context:  Alcohol use.  Past Medical History:  Past Medical History  Diagnosis Date  . Lupus (systemic lupus erythematosus)   . Discoid lupus   . Gastritis   . Chronic pain   . Restless leg   . Anxiety   . Diverticulitis   . Chronic kidney disease   . Seizures     Past Surgical History  Procedure Laterality Date  . Tubal ligation    . Abdominal hysterectomy    . Laparoscopy    . Rotator  cuff repair    . Lithotripsy    . Knee arthroscopy w/ meniscectomy    . Appendectomy    . Rotator cuff repair Bilateral     x2   Family History:  Family History  Problem Relation Age of Onset  . Cancer Son   . Cancer Mother   . Esophageal cancer Father   . Breast cancer Sister    Social History:  History  Alcohol Use  . 4.2 - 8.4 oz/week  . 7-14 Standard drinks or equivalent per week    Comment:  1-2 glass of wine nightly.       History  Drug Use No    Social History   Social History  . Marital Status: Married    Spouse Name: N/A  . Number of Children: 3  . Years of Education: N/A   Occupational History  . retired Therapist, sports    Social History Main Topics  . Smoking status: Current Every Day Smoker -- 1.00 packs/day for 60 years    Types: Cigarettes  . Smokeless tobacco: Never Used  . Alcohol Use: 4.2 - 8.4 oz/week    7-14 Standard drinks or equivalent per week     Comment: 1-2 glass of wine nightly.    . Drug Use: No  . Sexual Activity: Not Asked   Other Topics Concern  . None   Social History Narrative   Lives with friend.   She retired Therapist, sports and worked in psychiatry.         Additional Social History:    Pain Medications: None Reported Prescriptions: None Reported Over the Counter: None Reported History of alcohol / drug use?: Yes Name of Substance 1: Alcohol 1 - Age of First Use: Unknown 1 - Amount (size/oz): "Just a little Vodka" 1 - Frequency: Daily-"Just with my dinner" 1 - Duration: Per son, increase within the last year" 1 - Last Use / Amount: 02/15/2015                   Allergies:   Allergies  Allergen Reactions  . Amoxicillin Other (See Comments)  . Ioxaglate     Other reaction(s): Unknown  . Ivp Dye [Iodinated Diagnostic Agents]     Other reaction(s): Unknown  . Levaquin [Levofloxacin In D5w]   . Levofloxacin     Other reaction(s): Unknown  . Moxifloxacin Hcl In Nacl     Other reaction(s): Unknown  . Erythromycin Rash    Canker sore  . Sulfa Antibiotics Rash    Other reaction(s): Unknown    Labs:  Results for orders placed or performed during the hospital encounter of 02/16/15 (from the past 48 hour(s))  Ethanol     Status: Abnormal   Collection Time: 02/16/15  6:19 AM  Result Value Ref Range   Alcohol, Ethyl (B) 6 (H) <5 mg/dL    Comment:        LOWEST DETECTABLE LIMIT FOR SERUM ALCOHOL IS 5 mg/dL FOR MEDICAL PURPOSES  ONLY     Vitals: Blood pressure 148/89, pulse 73, temperature 98 F (36.7 C), temperature source Oral, resp. rate 18, height 5\' 5"  (1.651 m), weight 50.803 kg (112 lb), SpO2 100 %.  Risk to Self: Suicidal Ideation: No Suicidal Intent: No Is patient at risk for suicide?: No Suicidal Plan?: No Access to Means: No What has been your use of drugs/alcohol within the last 12 months?: Alocohol How many times?: 0 Other Self Harm Risks: 0 Triggers for Past Attempts: None known Intentional Self  Injurious Behavior: None Risk to Others: Homicidal Ideation: No Thoughts of Harm to Others: No Current Homicidal Intent: No Current Homicidal Plan: No Access to Homicidal Means: No Identified Victim: None Reported History of harm to others?: No Assessment of Violence: None Noted Violent Behavior Description: Per son, she throws things when she don't get to drink alcohol Does patient have access to weapons?: No Criminal Charges Pending?: No Does patient have a court date: No Prior Inpatient Therapy: Prior Inpatient Therapy: No Prior Therapy Dates: n/a Prior Therapy Facilty/Provider(s): n/a Reason for Treatment: n/a Prior Outpatient Therapy: Prior Outpatient Therapy: Yes Prior Therapy Dates: Current Prior Therapy Facilty/Provider(s): [facility] [place], [place]) Reason for Treatment: Bipolar Does patient have an ACCT team?: No Does patient have Intensive In-House Services?  : No Does patient have [facility] services? : No Does patient have P4CC services?: No  Current Facility-Administered Medications  Medication Dose Route Frequency Provider Last Rate Last Dose  . ibuprofen (ADVIL,MOTRIN) tablet 400 mg  400 mg Oral Once Carrie Mew, MD   400 mg at 02/16/15 1346  . ondansetron (ZOFRAN-ODT) disintegrating tablet 4 mg  4 mg Oral Once Carrie Mew, MD   4 mg at 02/16/15 1347   Current Outpatient Prescriptions  Medication Sig Dispense Refill  . Armodafinil (NUVIGIL) 250 MG  tablet Take 250 mg by mouth daily.    Marland Kitchen buPROPion (WELLBUTRIN SR) 100 MG 12 hr tablet Take 100 mg by mouth daily.    . clobetasol ointment (TEMOVATE) 0.96 % Apply 1 application topically 2 (two) times daily. Apply to arms and shins.    . diphenhydrAMINE (BENADRYL) 25 mg capsule Take 25 mg by mouth 2 (two) times daily as needed.    . DULoxetine (CYMBALTA) 30 MG capsule Take 60 mg by mouth daily.  1  . levETIRAcetam (KEPPRA) 500 MG tablet Take 500 mg by mouth daily.    . montelukast (SINGULAIR) 10 MG tablet Take 1 tablet by mouth daily.    . ondansetron (ZOFRAN) 4 MG tablet TAKE 1 TABLET EVERY 8 HOURS AS NEEDED FOR NAUSEA AND VOMITING (TO BE TAKEN BY PATIENT ONLY) 10 tablet 0  . QUEtiapine (SEROQUEL) 400 MG tablet Take 400 mg by mouth at bedtime.    . ranitidine (ZANTAC) 150 MG tablet Take 1 tablet by mouth daily.    Marland Kitchen rOPINIRole (REQUIP) 1 MG tablet Take 1 mg in the morning and 2 mg at night    . triamcinolone cream (KENALOG) 0.1 % Apply 1 application topically 2 (two) times daily.  2  . divalproex (DEPAKOTE ER) 250 MG 24 hr tablet Take 1 tablet by mouth daily.    . divalproex (DEPAKOTE) 125 MG DR tablet Take 125 mg by mouth 2 (two) times daily.       Musculoskeletal: Strength & Muscle Tone: decreased Gait & Station: Did not test Patient leans: N/A  Psychiatric Specialty Exam: Physical Exam  Constitutional: She appears well-developed and well-nourished.  HENT:  Head: Normocephalic and atraumatic.  Eyes: Conjunctivae are normal. Pupils are equal, round, and reactive to light.  Neck: Normal range of motion.  Cardiovascular: Normal heart sounds.   Respiratory: Effort normal.  GI: Soft.  Musculoskeletal: Normal range of motion.  Neurological: She is alert. Coordination abnormal.  Skin: Skin is warm and dry.  Psychiatric: She has a normal mood and affect. Her behavior is normal. Thought content normal. Her speech is delayed and tangential. She expresses impulsivity. She exhibits abnormal  recent memory and abnormal remote memory.    Review  of Systems  Constitutional: Negative.   HENT: Negative.   Eyes: Negative.   Respiratory: Negative.   Cardiovascular: Negative.   Gastrointestinal: Negative.   Musculoskeletal: Negative.   Skin: Negative.   Neurological: Negative.   Psychiatric/Behavioral: Positive for memory loss and substance abuse. Negative for depression, suicidal ideas and hallucinations. The patient is not nervous/anxious and does not have insomnia.     Blood pressure 148/89, pulse 73, temperature 98 F (36.7 C), temperature source Oral, resp. rate 18, height 5\' 5"  (1.651 m), weight 50.803 kg (112 lb), SpO2 100 %.Body mass index is 18.64 kg/(m^2).  General Appearance: Disheveled  Eye Sport and exercise psychologist::  Fair  Speech:  Garbled  Volume:  Decreased  Mood:  Anxious  Affect:  Labile  Thought Process:  Linear  Orientation:  Full (Time, Place, and Person)  Thought Content:  Negative  Suicidal Thoughts:  No  Homicidal Thoughts:  No  Memory:  Immediate;   Poor Recent;   Fair Remote;   Fair I did a full Mini-Mental status exam. To my prior she scored a 26 indicating probably only borderline mild dementia but did have some clear memory problems.  Judgement:  Intact  Insight:  Fair  Psychomotor Activity:  Decreased  Concentration:  Fair  Recall:  [employer] of Knowledge:Fair  Language: Fair  Akathisia:  No  Handed:  Right  AIMS (if indicated):     Assets:  Desire for Improvement Financial Resources/Insurance Housing Social Support  ADL's:  Intact  Cognition: Impaired,  Mild  Sleep:      Medical Decision Making: Review or order clinical lab tests (1), Established Problem, Worsening (2), Review of Medication Regimen & Side Effects (2) and Review of New Medication or Change in Dosage (2)  Treatment Plan Summary: Plan Patient does not meet commitment criteria. Does not appear to be acutely psychotic. What I can say is that from her own history it sounds like she is  drinking too much. It doesn't sound like there is a significant risk of seizures or delirium tremens with the alcohol may be causing her to have more frequent falls. I'm also concerned that she may be slightly hypomanic although it's also possible this may be near her baseline. There is no sign of acute psychosis. Patient should follow-up with her primary psychiatrist outpatient which is Dr. Dorann Ou to reevaluate medication. Patient was informed of all this and says she agrees that she ought to be drinking less. I would encourage the family to try and get the alcohol away from her as a first step. All of this communicated to Dr. Joni Fears.  Plan:  Patient does not meet criteria for psychiatric inpatient admission. Supportive therapy provided about ongoing stressors. Discussed crisis plan, support from social network, calling 911, coming to the Emergency Department, and calling Suicide Hotline. Disposition: No need for IVC no admission psychiatric hospital. Refer back to outpatient provider without any acute change to medication.  Kazuki Ingle 02/16/2015 3:09 PM

## 2015-02-16 NOTE — ED Notes (Addendum)
Patient presents to ED via EMS from home after falling this morning. Per EMS patient has been drinking, denies pain at this time. Patient has history of dementia. Patient lives with husband and son. Patient alert to self, month and situation, calm and cooperative, respirations even and unlabored.

## 2015-02-17 NOTE — Discharge Instructions (Signed)
Alcohol Use Disorder Alcohol use disorder is a mental disorder. It is not a one-time incident of heavy drinking. Alcohol use disorder is the excessive and uncontrollable use of alcohol over time that leads to problems with functioning in one or more areas of daily living. People with this disorder risk harming themselves and others when they drink to excess. Alcohol use disorder also can cause other mental disorders, such as mood and anxiety disorders, and serious physical problems. People with alcohol use disorder often misuse other drugs.  Alcohol use disorder is common and widespread. Some people with this disorder drink alcohol to cope with or escape from negative life events. Others drink to relieve chronic pain or symptoms of mental illness. People with a family history of alcohol use disorder are at higher risk of losing control and using alcohol to excess.  SYMPTOMS  Signs and symptoms of alcohol use disorder may include the following:   Consumption ofalcohol inlarger amounts or over a longer period of time than intended.  Multiple unsuccessful attempts to cutdown or control alcohol use.   A great deal of time spent obtaining alcohol, using alcohol, or recovering from the effects of alcohol (hangover).  A strong desire or urge to use alcohol (cravings).   Continued use of alcohol despite problems at work, school, or home because of alcohol use.   Continued use of alcohol despite problems in relationships because of alcohol use.  Continued use of alcohol in situations when it is physically hazardous, such as driving a car.  Continued use of alcohol despite awareness of a physical or psychological problem that is likely related to alcohol use. Physical problems related to alcohol use can involve the brain, heart, liver, stomach, and intestines. Psychological problems related to alcohol use include intoxication, depression, anxiety, psychosis, delirium, and dementia.   The need for  increased amounts of alcohol to achieve the same desired effect, or a decreased effect from the consumption of the same amount of alcohol (tolerance).  Withdrawal symptoms upon reducing or stopping alcohol use, or alcohol use to reduce or avoid withdrawal symptoms. Withdrawal symptoms include:  Racing heart.  Hand tremor.  Difficulty sleeping.  Nausea.  Vomiting.  Hallucinations.  Restlessness.  Seizures. DIAGNOSIS Alcohol use disorder is diagnosed through an assessment by your health care provider. Your health care provider may start by asking three or four questions to screen for excessive or problematic alcohol use. To confirm a diagnosis of alcohol use disorder, at least two symptoms must be present within a 12-month period. The severity of alcohol use disorder depends on the number of symptoms:  Mild--two or three.  Moderate--four or five.  Severe--six or more. Your health care provider may perform a physical exam or use results from lab tests to see if you have physical problems resulting from alcohol use. Your health care provider may refer you to a mental health professional for evaluation. TREATMENT  Some people with alcohol use disorder are able to reduce their alcohol use to low-risk levels. Some people with alcohol use disorder need to quit drinking alcohol. When necessary, mental health professionals with specialized training in substance use treatment can help. Your health care provider can help you decide how severe your alcohol use disorder is and what type of treatment you need. The following forms of treatment are available:   Detoxification. Detoxification involves the use of prescription medicines to prevent alcohol withdrawal symptoms in the first week after quitting. This is important for people with a history of symptoms   of withdrawal and for heavy drinkers who are likely to have withdrawal symptoms. Alcohol withdrawal can be dangerous and, in severe cases, cause  death. Detoxification is usually provided in a hospital or in-patient substance use treatment facility.  Counseling or talk therapy. Talk therapy is provided by substance use treatment counselors. It addresses the reasons people use alcohol and ways to keep them from drinking again. The goals of talk therapy are to help people with alcohol use disorder find healthy activities and ways to cope with life stress, to identify and avoid triggers for alcohol use, and to handle cravings, which can cause relapse.  Medicines.Different medicines can help treat alcohol use disorder through the following actions:  Decrease alcohol cravings.  Decrease the positive reward response felt from alcohol use.  Produce an uncomfortable physical reaction when alcohol is used (aversion therapy).  Support groups. Support groups are run by people who have quit drinking. They provide emotional support, advice, and guidance. These forms of treatment are often combined. Some people with alcohol use disorder benefit from intensive combination treatment provided by specialized substance use treatment centers. Both inpatient and outpatient treatment programs are available. Document Released: 07/19/2004 Document Revised: 10/26/2013 Document Reviewed: 09/18/2012 Exit[employer] Patient Information 2015 [employer]. This information is not intended to replace advice given to you by your health care provider. Make sure you discuss any questions you have with your health care provider.  

## 2015-02-17 NOTE — ED Provider Notes (Signed)
-----------------------------------------   4:20 PM on 02/17/2015 -----------------------------------------  This is a late entry progress note pertaining to my care of the patient on 02/16/2015, due to medical record system failure and being off-line and unable to enter notes during my shift yesterday. Received sign out of this patient at 7 AM on 8/24, awaiting psychiatry consultation. Briefly, the patient is a chronic alcohol abuser who had a fall early yesterday morning. CT head was negative, ethanol level on arrival was 6. Family requested psychiatric evaluation due to her chronic alcohol abuse  I discussed the patient's case with psychiatry Dr. Weber Cooks in the ED at 2:00 PM. He notes that the patient has mild dementia but has insight into her alcohol abuse and knows that she should cut back or stop altogether. The patient was given resources for follow-up but does not require inpatient treatment and is at low risk for withdrawal or other harm to self or others at this time. Medically and psychiatrically stable and was discharged from the hospital. I completed paper handwritten discharge instructions for her due to electronic medical record system being off-line at time of discharge.  Carrie Mew, MD 02/17/15 [phone]

## 2015-02-24 DIAGNOSIS — F438 Other reactions to severe stress: Secondary | ICD-10-CM | POA: Diagnosis not present

## 2015-02-24 DIAGNOSIS — F332 Major depressive disorder, recurrent severe without psychotic features: Secondary | ICD-10-CM | POA: Diagnosis not present

## 2015-02-24 DIAGNOSIS — F439 Reaction to severe stress, unspecified: Secondary | ICD-10-CM | POA: Diagnosis not present

## 2015-03-14 DIAGNOSIS — L299 Pruritus, unspecified: Secondary | ICD-10-CM | POA: Diagnosis not present

## 2015-03-14 DIAGNOSIS — L280 Lichen simplex chronicus: Secondary | ICD-10-CM | POA: Diagnosis not present

## 2015-03-14 DIAGNOSIS — D692 Other nonthrombocytopenic purpura: Secondary | ICD-10-CM | POA: Diagnosis not present

## 2015-03-14 DIAGNOSIS — R21 Rash and other nonspecific skin eruption: Secondary | ICD-10-CM | POA: Diagnosis not present

## 2015-03-17 DIAGNOSIS — M25572 Pain in left ankle and joints of left foot: Secondary | ICD-10-CM | POA: Diagnosis not present

## 2015-03-25 ENCOUNTER — Telehealth: Payer: Self-pay

## 2015-03-25 NOTE — Telephone Encounter (Signed)
Thayer Headings gave me this call. I spoke to [place] and she was very disoriented. Her speech was slurred bad enough that I could not understand any words. I asked her to give someone the phone. Her room mate who is a female got on phone very upset that I recommended 911. He said NO and said NO ER either. He said he is sick of the run around. He said skin center advised him to call PCP. I explained after knowing this patient that this is out of character for her and she could be suffering a lifethreatening illness. He disagreed and I turned this over to manager Dione Booze. [facility]

## 2015-03-25 NOTE — Telephone Encounter (Signed)
I spoke with patients room mate and he refused to take patient to ER or call 911.  We did make appointment for her on Monday 03/28/15 and he walled back and canceled appointment stating "he would call back and make appointment when patient has had some sleep.  In the mean time Dr. Zandra Abts from [facility] called wanting to express her concerns about Paula Frank and roommates relationship.  She is concerned that he is make discission's for her that are not in her best interest.  She would like to talk to you about what the next step would be, Maybe adult protective services.  You can call Dr. Zandra Abts at [facility] back at [phone].  Paula Frank CMA

## 2015-03-28 ENCOUNTER — Ambulatory Visit: Payer: Self-pay | Admitting: Family Medicine

## 2015-03-28 NOTE — Telephone Encounter (Signed)
Need to try to contact her today.  If still slurred of having other concerns, or roommate obstructive, then call adult protective services to report.-jh

## 2015-03-29 ENCOUNTER — Telehealth: Payer: Self-pay

## 2015-03-29 NOTE — Telephone Encounter (Signed)
LM ON Roomates VM and no VM on cell. Called a few times. Should I call APS?

## 2015-03-29 NOTE — Telephone Encounter (Signed)
Send a letter asking for her to get in touch with [place] about her situation.  If she does not respond, then should call adult protective services.-jh

## 2015-03-29 NOTE — Telephone Encounter (Signed)
Sent letter 03/29/2015. Will await response.[facility]

## 2015-03-29 NOTE — Telephone Encounter (Signed)
Left Message

## 2015-03-29 NOTE — Telephone Encounter (Signed)
Mailed note to patient. See other notes.[facility]

## 2015-04-04 ENCOUNTER — Encounter: Payer: Self-pay | Admitting: Family Medicine

## 2015-04-04 ENCOUNTER — Ambulatory Visit (INDEPENDENT_AMBULATORY_CARE_PROVIDER_SITE_OTHER): Payer: Self-pay | Admitting: Family Medicine

## 2015-04-04 ENCOUNTER — Telehealth: Payer: Self-pay

## 2015-04-04 VITALS — BP 124/71 | HR 95 | Temp 98.1°F | Resp 16 | Ht 66.0 in | Wt 111.7 lb

## 2015-04-04 DIAGNOSIS — F329 Major depressive disorder, single episode, unspecified: Secondary | ICD-10-CM | POA: Insufficient documentation

## 2015-04-04 DIAGNOSIS — F310 Bipolar disorder, current episode hypomanic: Secondary | ICD-10-CM

## 2015-04-04 DIAGNOSIS — F438 Other reactions to severe stress: Secondary | ICD-10-CM | POA: Diagnosis not present

## 2015-04-04 DIAGNOSIS — F439 Reaction to severe stress, unspecified: Secondary | ICD-10-CM | POA: Diagnosis not present

## 2015-04-04 DIAGNOSIS — F332 Major depressive disorder, recurrent severe without psychotic features: Secondary | ICD-10-CM | POA: Diagnosis not present

## 2015-04-04 DIAGNOSIS — R112 Nausea with vomiting, unspecified: Secondary | ICD-10-CM

## 2015-04-04 DIAGNOSIS — F32A Depression, unspecified: Secondary | ICD-10-CM | POA: Insufficient documentation

## 2015-04-04 DIAGNOSIS — L280 Lichen simplex chronicus: Secondary | ICD-10-CM | POA: Insufficient documentation

## 2015-04-04 DIAGNOSIS — L509 Urticaria, unspecified: Secondary | ICD-10-CM

## 2015-04-04 MED ORDER — DOXEPIN HCL 25 MG PO CAPS: 25.0000 mg | ORAL_CAPSULE | Freq: Every day | ORAL | Status: DC

## 2015-04-04 MED ORDER — MONTELUKAST SODIUM 10 MG PO TABS: 10.0000 mg | ORAL_TABLET | Freq: Every day | ORAL | Status: DC

## 2015-04-04 MED ORDER — ONDANSETRON HCL 4 MG PO TABS: 4.0000 mg | ORAL_TABLET | Freq: Three times a day (TID) | ORAL | Status: DC | PRN

## 2015-04-04 NOTE — Telephone Encounter (Signed)
OK.  Express concern that we have not been able to contact patient with numerous attempts and that her "roommate" has not followed recommendations re: her health care.-jh

## 2015-04-04 NOTE — Telephone Encounter (Signed)
Patient called back. She made apt for itching and to be sure all meds are good before traveling in a truck to live in [place]. Room mate kicked out and son is staying until she moves to his home. She is going to Psych at 1:30 and will see [place] at 3:30. [facility] states it is most likely her nerves causing the itching.

## 2015-04-04 NOTE — Telephone Encounter (Signed)
Noted-jh 

## 2015-04-04 NOTE — Telephone Encounter (Signed)
Called to check on patient again and also sent letter about a week ago.  Have tried several times to reach. I will call APS today.

## 2015-04-04 NOTE — Progress Notes (Signed)
Name: Paula Frank   MRN: [id number]    DOB: Dec 01, 1940   Date:04/04/2015       Progress Note  Subjective  Chief Complaint  Chief Complaint  Patient presents with  . Rash    neurodermatitis    HPI Here for cf/u of neurodermatitis.  Says itching was somewhat better with the Doxepin, but she has lost her bottle.  Needs refill.  Planning to move to [place] with her son by next month.    No problem-specific assessment & plan notes found for this encounter.   Past Medical History  Diagnosis Date  . Lupus (systemic lupus erythematosus) ([place])   . Discoid lupus   . Gastritis   . Chronic pain   . Restless leg   . Anxiety   . Diverticulitis   . Chronic kidney disease   . Seizures ([place])     Social History  Substance Use Topics  . Smoking status: Current Every Day Smoker -- 1.00 packs/day for 60 years    Types: Cigarettes  . Smokeless tobacco: Never Used  . Alcohol Use: 4.2 - 8.4 oz/week    7-14 Standard drinks or equivalent per week     Comment: 1-2 glass of wine nightly.       Current outpatient prescriptions:  .  acetaminophen-codeine (TYLENOL #3) 300-30 MG per tablet, Take 1 tablet by mouth every 6 (six) hours as needed. for pain, Disp: , Rfl: 0 .  Armodafinil (NUVIGIL) 250 MG tablet, Take 250 mg by mouth daily., Disp: , Rfl:  .  buPROPion (WELLBUTRIN SR) 100 MG 12 hr tablet, Take 100 mg by mouth daily., Disp: , Rfl:  .  clobetasol ointment (TEMOVATE) 4.09 %, Apply 1 application topically 2 (two) times daily. Apply to arms and shins., Disp: , Rfl:  .  diphenhydrAMINE (BENADRYL) 25 mg capsule, Take 25 mg by mouth 2 (two) times daily as needed., Disp: , Rfl:  .  divalproex (DEPAKOTE ER) 250 MG 24 hr tablet, Take 1 tablet by mouth daily., Disp: , Rfl:  .  doxepin (SINEQUAN) 25 MG capsule, TAKE 1 CAPSULE EACH NIGHT, Disp: , Rfl: 1 .  levETIRAcetam (KEPPRA) 500 MG tablet, Take 500 mg by mouth daily., Disp: , Rfl:  .  montelukast (SINGULAIR) 10 MG tablet, Take 1 tablet by  mouth daily., Disp: , Rfl:  .  ondansetron (ZOFRAN) 4 MG tablet, TAKE 1 TABLET EVERY 8 HOURS AS NEEDED FOR NAUSEA AND VOMITING (TO BE TAKEN BY PATIENT ONLY), Disp: 10 tablet, Rfl: 0 .  QUEtiapine (SEROQUEL) 400 MG tablet, Take 400 mg by mouth at bedtime., Disp: , Rfl:  .  ranitidine (ZANTAC) 150 MG tablet, Take 1 tablet by mouth daily., Disp: , Rfl:  .  rOPINIRole (REQUIP) 1 MG tablet, Take 1 mg in the morning and 2 mg at night, Disp: , Rfl:  .  triamcinolone cream (KENALOG) 0.1 %, Apply 1 application topically 2 (two) times daily., Disp: , Rfl: 2  Allergies  Allergen Reactions  . Amoxicillin Other (See Comments)  . Ioxaglate     Other reaction(s): Unknown  . Ivp Dye [Iodinated Diagnostic Agents]     Other reaction(s): Unknown  . Levaquin [Levofloxacin In D5w]   . Levofloxacin     Other reaction(s): Unknown  . Moxifloxacin Hcl In Nacl     Other reaction(s): Unknown  . Erythromycin Rash    Canker sore  . Sulfa Antibiotics Rash    Other reaction(s): Unknown    Review of Systems  Constitutional:  Negative for fever, chills, weight loss and malaise/fatigue.  HENT: Negative for hearing loss.   Eyes: Negative for blurred vision and double vision.  Respiratory: Negative for cough, shortness of breath and wheezing.   Cardiovascular: Negative for chest pain, palpitations, orthopnea and leg swelling.  Gastrointestinal: Negative for heartburn, abdominal pain and blood in stool.  Genitourinary: Negative for dysuria, urgency and frequency.  Skin: Positive for itching and rash.  Neurological: Negative for dizziness, weakness and headaches.  Psychiatric/Behavioral: Positive for depression. The patient is nervous/anxious and has insomnia.       Objective  Filed Vitals:   04/04/15 1511  BP: 124/71  Pulse: 95  Temp: 98.1 F (36.7 C)  Resp: 16  Height: 5\' 6"  (1.676 m)  Weight: 111 lb 11.2 oz (50.667 kg)     Physical Exam  Constitutional: She is well-developed, well-nourished, and  in no distress. No distress.  HENT:  Head: Normocephalic and atraumatic.  Eyes: Conjunctivae and EOM are normal. Pupils are equal, round, and reactive to light. No scleral icterus.  Neck: Normal range of motion. Neck supple. Carotid bruit is not present. No thyromegaly present.  Cardiovascular: Regular rhythm, normal heart sounds and intact distal pulses.  Tachycardia present.  Exam reveals no gallop and no friction rub.   No murmur heard. Pulmonary/Chest: Effort normal. No respiratory distress. She has no wheezes. She has no rales.  Abdominal: Soft. Bowel sounds are normal. She exhibits no distension and no mass. There is no tenderness.  Musculoskeletal: She exhibits no edema.  Lymphadenopathy:    She has no cervical adenopathy.  Skin:  Diffuse excoriations with raw areas on multiple areas of body as usual. Hematoma L buttock  Vitals reviewed.     Recent Results (from the past 2160 hour(s))  Ethanol     Status: Abnormal   Collection Time: 02/16/15  6:19 AM  Result Value Ref Range   Alcohol, Ethyl (B) 6 (H) <5 mg/dL    Comment:        LOWEST DETECTABLE LIMIT FOR SERUM ALCOHOL IS 5 mg/dL FOR MEDICAL PURPOSES ONLY      Assessment & Plan  1. Neurodermatitis  - doxepin (SINEQUAN) 25 MG capsule; Take 1 capsule (25 mg total) by mouth at bedtime.  Dispense: 30 capsule; Refill: 0  2. Bipolar disorder, current episode hypomanic ([place])   3. Hive  - montelukast (SINGULAIR) 10 MG tablet; Take 1 tablet (10 mg total) by mouth daily.  Dispense: 30 tablet; Refill: 6  4. Non-intractable vomiting with nausea, vomiting of unspecified type  - ondansetron (ZOFRAN) 4 MG tablet; Take 1 tablet (4 mg total) by mouth every 8 (eight) hours as needed for nausea or vomiting.  Dispense: 20 tablet; Refill: 3

## 2015-04-21 DIAGNOSIS — H43813 Vitreous degeneration, bilateral: Secondary | ICD-10-CM | POA: Diagnosis not present

## 2015-04-21 DIAGNOSIS — Z961 Presence of intraocular lens: Secondary | ICD-10-CM | POA: Diagnosis not present

## 2015-05-06 ENCOUNTER — Other Ambulatory Visit: Payer: Self-pay | Admitting: *Deleted

## 2015-05-06 DIAGNOSIS — L280 Lichen simplex chronicus: Secondary | ICD-10-CM

## 2015-05-06 MED ORDER — DOXEPIN HCL 25 MG PO CAPS: 25.0000 mg | ORAL_CAPSULE | Freq: Every day | ORAL | Status: DC

## 2015-06-03 ENCOUNTER — Encounter: Payer: Self-pay | Admitting: Family Medicine

## 2015-06-03 ENCOUNTER — Ambulatory Visit (INDEPENDENT_AMBULATORY_CARE_PROVIDER_SITE_OTHER): Payer: Medicare Other | Admitting: Family Medicine

## 2015-06-03 VITALS — BP 104/61 | HR 97 | Temp 97.4°F | Resp 16 | Ht 66.0 in | Wt 109.0 lb

## 2015-06-03 DIAGNOSIS — L280 Lichen simplex chronicus: Secondary | ICD-10-CM

## 2015-06-03 DIAGNOSIS — J40 Bronchitis, not specified as acute or chronic: Secondary | ICD-10-CM

## 2015-06-03 MED ORDER — TRIAMCINOLONE ACETONIDE 0.1 % EX CREA: 1.0000 "application " | TOPICAL_CREAM | Freq: Two times a day (BID) | CUTANEOUS | Status: DC

## 2015-06-03 MED ORDER — AZITHROMYCIN 250 MG PO TABS: ORAL_TABLET | ORAL | Status: DC

## 2015-06-03 NOTE — Patient Instructions (Signed)
Info given re: Psychiatric clinics for her to call.

## 2015-06-03 NOTE — Progress Notes (Signed)
Name: Paula Frank   MRN: [id number]    DOB: 1941-04-27   Date:06/03/2015       Progress Note  Subjective  Chief Complaint  Chief Complaint  Patient presents with  . Cough    2 weeks  . Depression    needs new psych and refills till seen.     HPI Here c/o cough.  Cough x 2 weeks.  Has gotten worse.  Productive of cream colored sputum.  Mild SOB.  Hears wheezing/ rattling sounds from chest.   No fever. No problem-specific assessment & plan notes found for this encounter.   Past Medical History  Diagnosis Date  . Lupus (systemic lupus erythematosus) ([place])   . Discoid lupus   . Gastritis   . Chronic pain   . Restless leg   . Anxiety   . Diverticulitis   . Chronic kidney disease   . Seizures ([place])     Social History  Substance Use Topics  . Smoking status: Current Every Day Smoker -- 1.00 packs/day for 60 years    Types: Cigarettes  . Smokeless tobacco: Never Used  . Alcohol Use: 4.2 - 8.4 oz/week    7-14 Standard drinks or equivalent per week     Comment: 1-2 glass of wine nightly.       Current outpatient prescriptions:  .  Armodafinil (NUVIGIL) 250 MG tablet, Take 250 mg by mouth daily., Disp: , Rfl:  .  buPROPion (WELLBUTRIN SR) 100 MG 12 hr tablet, Take 100 mg by mouth daily., Disp: , Rfl:  .  clobetasol ointment (TEMOVATE) [id number]-[id number] %, Apply 1 application topically 2 (two) times daily. Apply to arms and shins., Disp: , Rfl:  .  diphenhydrAMINE (BENADRYL) 25 mg capsule, Take 25 mg by mouth 2 (two) times daily as needed., Disp: , Rfl:  .  divalproex (DEPAKOTE ER) 250 MG 24 hr tablet, Take 1 tablet by mouth daily., Disp: , Rfl:  .  doxepin (SINEQUAN) 25 MG capsule, Take 1 capsule (25 mg total) by mouth at bedtime., Disp: 30 capsule, Rfl: 6 .  levETIRAcetam (KEPPRA) 500 MG tablet, Take 500 mg by mouth daily., Disp: , Rfl:  .  montelukast (SINGULAIR) 10 MG tablet, Take 1 tablet (10 mg total) by mouth daily., Disp: 30 tablet, Rfl: 6 .  ondansetron (ZOFRAN) 4 MG tablet,  Take 1 tablet (4 mg total) by mouth every 8 (eight) hours as needed for nausea or vomiting., Disp: 20 tablet, Rfl: 3 .  QUEtiapine (SEROQUEL) 400 MG tablet, Take 400 mg by mouth at bedtime., Disp: , Rfl:  .  ranitidine (ZANTAC) 150 MG tablet, Take 1 tablet by mouth daily., Disp: , Rfl:  .  rOPINIRole (REQUIP) 1 MG tablet, Take 1 mg in the morning and 2 mg at night, Disp: , Rfl:  .  triamcinolone cream (KENALOG) 0.1 %, Apply 1 application topically 2 (two) times daily., Disp: , Rfl: 2  Allergies  Allergen Reactions  . Amoxicillin Other (See Comments)  . Ioxaglate     Other reaction(s): Unknown  . Ivp Dye [Iodinated Diagnostic Agents]     Other reaction(s): Unknown  . Levaquin [Levofloxacin In D5w]   . Levofloxacin     Other reaction(s): Unknown  . Moxifloxacin Hcl In Nacl     Other reaction(s): Unknown  . Erythromycin Rash    Canker sore  . Sulfa Antibiotics Rash    Other reaction(s): Unknown    Review of Systems  Constitutional: Positive for chills and malaise/fatigue. Negative for  fever and weight loss.  Eyes: Negative for blurred vision and double vision.  Respiratory: Positive for cough, sputum production, shortness of breath and wheezing.   Cardiovascular: Negative for chest pain, palpitations and leg swelling.  Gastrointestinal: Negative for heartburn, abdominal pain and blood in stool.  Genitourinary: Negative for dysuria, urgency and frequency.  Skin: Positive for itching and rash.  Neurological: Negative for dizziness, weakness and headaches.      Objective  Filed Vitals:   06/03/15 1515  BP: 104/61  Pulse: 97  Temp: 97.4 F (36.3 C)  Resp: 16  Height: 5\' 6"  (1.676 m)  Weight: 109 lb (49.442 kg)  SpO2: 97%     Physical Exam  Constitutional: She is well-developed, well-nourished, and in no distress.  HENT:  Head: Normocephalic and atraumatic.  Right Ear: External ear normal.  Left Ear: External ear normal.  Nose: No rhinorrhea.  Mouth/Throat:  Oropharynx is clear and moist.  Cardiovascular: Normal rate, regular rhythm, normal heart sounds and intact distal pulses.  Exam reveals no gallop and no friction rub.   No murmur heard. Pulmonary/Chest: Effort normal and breath sounds normal. No respiratory distress. She has no wheezes. She has no rales.  Coarse breath sounds throughout.  Musculoskeletal: She exhibits no edema.  Skin:  Evidence of excoriation with neurodermatitis.  Vitals reviewed.     No results found for this or any previous visit (from the past 2160 hour(s)).   Assessment & Plan 1. Bronchitis  - azithromycin (ZITHROMAX) 250 MG tablet; Take 2 tables on day 1 then 1 tablet daily for days 2-5.  Dispense: 6 tablet; Refill: 0  2. Neurodermatitis  - triamcinolone cream (KENALOG) 0.1 %; Apply 1 application topically 2 (two) times daily.  Dispense: 60 g; Refill: 2

## 2015-06-06 DIAGNOSIS — L309 Dermatitis, unspecified: Secondary | ICD-10-CM | POA: Diagnosis not present

## 2015-06-06 DIAGNOSIS — L853 Xerosis cutis: Secondary | ICD-10-CM | POA: Diagnosis not present

## 2015-06-06 DIAGNOSIS — L299 Pruritus, unspecified: Secondary | ICD-10-CM | POA: Diagnosis not present

## 2015-06-06 DIAGNOSIS — F3289 Other specified depressive episodes: Secondary | ICD-10-CM | POA: Diagnosis not present

## 2015-06-14 ENCOUNTER — Emergency Department: Payer: Medicare Other

## 2015-06-14 ENCOUNTER — Emergency Department
Admission: EM | Admit: 2015-06-14 | Discharge: 2015-06-14 | Disposition: A | Discharge status: Home / Self Care | Payer: Medicare Other | Attending: Emergency Medicine | Admitting: Emergency Medicine

## 2015-06-14 ENCOUNTER — Encounter: Payer: Self-pay | Admitting: *Deleted

## 2015-06-14 DIAGNOSIS — F17210 Nicotine dependence, cigarettes, uncomplicated: Secondary | ICD-10-CM | POA: Diagnosis not present

## 2015-06-14 DIAGNOSIS — Z880 Allergy status to penicillin: Secondary | ICD-10-CM | POA: Insufficient documentation

## 2015-06-14 DIAGNOSIS — R109 Unspecified abdominal pain: Secondary | ICD-10-CM | POA: Insufficient documentation

## 2015-06-14 DIAGNOSIS — Z79899 Other long term (current) drug therapy: Secondary | ICD-10-CM | POA: Diagnosis not present

## 2015-06-14 DIAGNOSIS — Z791 Long term (current) use of non-steroidal anti-inflammatories (NSAID): Secondary | ICD-10-CM | POA: Diagnosis not present

## 2015-06-14 LAB — CBC
HEMATOCRIT: 35.3 % (ref 35.0–47.0)
Hemoglobin: 11.6 g/dL — ABNORMAL LOW (ref 12.0–16.0)
MCH: 32.1 pg (ref 26.0–34.0)
MCHC: 32.8 g/dL (ref 32.0–36.0)
MCV: 98 fL (ref 80.0–100.0)
PLATELETS: 429 10*3/uL (ref 150–440)
RBC: 3.6 MIL/uL — AB (ref 3.80–5.20)
RDW: 14.3 % (ref 11.5–14.5)
WBC: 4.9 10*3/uL (ref 3.6–11.0)

## 2015-06-14 LAB — URINALYSIS COMPLETE WITH MICROSCOPIC (ARMC ONLY)
BACTERIA UA: NONE SEEN
Bilirubin Urine: NEGATIVE
Glucose, UA: NEGATIVE mg/dL
KETONES UR: NEGATIVE mg/dL
NITRITE: NEGATIVE
PH: 6 (ref 5.0–8.0)
PROTEIN: NEGATIVE mg/dL
Specific Gravity, Urine: 1.011 (ref 1.005–1.030)

## 2015-06-14 LAB — COMPREHENSIVE METABOLIC PANEL
ALT: 21 U/L (ref 14–54)
AST: 28 U/L (ref 15–41)
Albumin: 3.7 g/dL (ref 3.5–5.0)
Alkaline Phosphatase: 126 U/L (ref 38–126)
Anion gap: 6 (ref 5–15)
BUN: 14 mg/dL (ref 6–20)
CHLORIDE: 106 mmol/L (ref 101–111)
CO2: 26 mmol/L (ref 22–32)
Calcium: 9.3 mg/dL (ref 8.9–10.3)
Creatinine, Ser: 0.59 mg/dL (ref 0.44–1.00)
Glucose, Bld: 86 mg/dL (ref 65–99)
POTASSIUM: 4.4 mmol/L (ref 3.5–5.1)
Sodium: 138 mmol/L (ref 135–145)
Total Bilirubin: 0.5 mg/dL (ref 0.3–1.2)
Total Protein: 7.6 g/dL (ref 6.5–8.1)

## 2015-06-14 MED ORDER — ONDANSETRON HCL 4 MG/2ML IJ SOLN
4.0000 mg | Freq: Once | INTRAMUSCULAR | Status: AC
  Administered 2015-06-14: 4 mg via INTRAVENOUS
  Filled 2015-06-14: qty 2

## 2015-06-14 MED ORDER — SODIUM CHLORIDE 0.9 % IV SOLN
1000.0000 mL | Freq: Once | INTRAVENOUS | Status: AC
  Administered 2015-06-14: 1000 mL via INTRAVENOUS

## 2015-06-14 MED ORDER — MORPHINE SULFATE (PF) 4 MG/ML IV SOLN
4.0000 mg | Freq: Once | INTRAVENOUS | Status: AC
  Administered 2015-06-14: 4 mg via INTRAVENOUS
  Filled 2015-06-14: qty 1

## 2015-06-14 MED ORDER — KETOROLAC TROMETHAMINE 30 MG/ML IJ SOLN
15.0000 mg | Freq: Once | INTRAMUSCULAR | Status: AC
  Administered 2015-06-14: 15 mg via INTRAVENOUS
  Filled 2015-06-14: qty 1

## 2015-06-14 MED ORDER — METOCLOPRAMIDE HCL 5 MG/ML IJ SOLN
10.0000 mg | Freq: Once | INTRAMUSCULAR | Status: AC
  Administered 2015-06-14: 10 mg via INTRAVENOUS
  Filled 2015-06-14: qty 2

## 2015-06-14 MED ORDER — ONDANSETRON 8 MG PO TBDP: 8.0000 mg | ORAL_TABLET | Freq: Three times a day (TID) | ORAL | Status: DC | PRN

## 2015-06-14 MED ORDER — DIPHENHYDRAMINE HCL 50 MG/ML IJ SOLN
25.0000 mg | Freq: Once | INTRAMUSCULAR | Status: AC
  Administered 2015-06-14: 25 mg via INTRAVENOUS
  Filled 2015-06-14: qty 1

## 2015-06-14 MED ORDER — HYDROMORPHONE HCL 1 MG/ML IJ SOLN
1.0000 mg | INTRAMUSCULAR | Status: DC | PRN
  Administered 2015-06-14: 1 mg via INTRAVENOUS
  Filled 2015-06-14: qty 1

## 2015-06-14 MED ORDER — NAPROXEN 500 MG PO TABS: 500.0000 mg | ORAL_TABLET | Freq: Two times a day (BID) | ORAL | Status: DC

## 2015-06-14 NOTE — ED Notes (Signed)
Attempted to call patient's family for discharge pickup.  This nurse got in touch with Octavia Bruckner, who is driving in from [place] at this time.  This nurse tried calling [name] but went straight to voicemail.

## 2015-06-14 NOTE — ED Provider Notes (Signed)
[facility] Emergency Department Provider Note  ____________________________________________  Time seen: 2:45 PM  I have reviewed the triage vital signs and the nursing notes.   HISTORY  Chief Complaint Flank Pain    HPI Paula Frank is a 74 y.o. female reports gradual onset left flank pain last night that is worsening. It feels like previous kidney stones with which she has previously had to have lithotripsy. Denies any fever chills vomiting or diarrhea.denies dysuria frequency urgency hematuria chest pain or shortness of breath.  Pain is nonradiating, aching, severe. No aggravating or alleviating factors.pain is not exertional, not pleuritic   Past Medical History  Diagnosis Date  . Lupus (systemic lupus erythematosus) ([place])   . Discoid lupus   . Gastritis   . Chronic pain   . Restless leg   . Anxiety   . Diverticulitis   . Chronic kidney disease   . Seizures [facility])      Patient Active Problem List   Diagnosis Date Noted  . Bronchitis 06/03/2015  . Clinical depression 04/04/2015  . Hive 04/04/2015  . 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 Emesis 04/04/2015  . Bipolar disorder, current episode hypomanic ([place]) 02/16/2015  . Alcohol abuse 02/16/2015  . Alzheimer's dementia 02/16/2015  . DDD (degenerative disc disease), lumbosacral 10/31/2014  . 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 DDD (degenerative disc disease), cervical 10/25/2014  . DDD (degenerative disc disease), lumbar 10/25/2014  . Complete rotator cuff rupture of left shoulder 08/09/2014  . Pain in shoulder 07/14/2014  . Current tobacco use 02/19/2014  . Systemic lupus erythematosus ([place]) 01/14/2014  . Amnesia 12/01/2013  . Loss of feeling or sensation 10/30/2013  . Seizure ([place]) 10/30/2013  . Disordered sleep 10/30/2013  . Adynamia 10/30/2013  . Blood pressure elevated 05/19/2012  . Fatigue 11/20/2011     Past Surgical  History  Procedure Laterality Date  . Tubal ligation    . Abdominal hysterectomy    . Laparoscopy    . Rotator cuff repair    . Lithotripsy    . Knee arthroscopy w/ meniscectomy    . Appendectomy    . Rotator cuff repair Bilateral     x2     Current Outpatient Rx  Name  Route  Sig  Dispense  Refill  . Armodafinil (NUVIGIL) 250 MG tablet   Oral   Take 250 mg by mouth daily.         Marland Kitchen buPROPion (WELLBUTRIN SR) 100 MG 12 hr tablet   Oral   Take 100 mg by mouth daily.         . clobetasol ointment (TEMOVATE) 0.05 %   Topical   Apply 1 application topically 2 (two) times daily. Apply to arms and shins.         . diphenhydrAMINE (BENADRYL) 25 mg capsule   Oral   Take 25 mg by mouth 2 (two) times daily as needed.         . doxepin (SINEQUAN) 25 MG capsule   Oral   Take 1 capsule (25 mg total) by mouth at bedtime.   30 capsule   6   . halobetasol (ULTRAVATE) 0.05 % ointment   Topical   Apply 1 application topically 2 (two) times daily.      5   . levETIRAcetam (KEPPRA) 500 MG tablet   Oral   Take 500 mg by mouth daily.         . montelukast (SINGULAIR) 10  MG tablet   Oral   Take 1 tablet (10 mg total) by mouth daily.   30 tablet   6   . QUEtiapine (SEROQUEL) 400 MG tablet   Oral   Take 400 mg by mouth at bedtime.         . ranitidine (ZANTAC) 150 MG tablet   Oral   Take 1 tablet by mouth daily.         Marland Kitchen rOPINIRole (REQUIP) 1 MG tablet      Take 1 mg in the morning and 2 mg at night         . triamcinolone cream (KENALOG) 0.1 %   Topical   Apply 1 application topically 2 (two) times daily.   60 g   2   . divalproex (DEPAKOTE ER) 250 MG 24 hr tablet   Oral   Take 1 tablet by mouth daily.         . naproxen (NAPROSYN) 500 MG tablet   Oral   Take 1 tablet (500 mg total) by mouth 2 (two) times daily with a meal.   20 tablet   0   . ondansetron (ZOFRAN ODT) 8 MG disintegrating tablet   Oral   Take 1 tablet (8 mg total) by mouth  every 8 (eight) hours as needed for nausea or vomiting.   20 tablet   0      Allergies Amoxicillin; Ioxaglate; Ivp dye; Levaquin; Levofloxacin; Moxifloxacin hcl in nacl; Erythromycin; and Sulfa antibiotics   Family History  Problem Relation Age of Onset  . Cancer Son   . Cancer Mother   . Esophageal cancer Father   . Breast cancer Sister     Social History Social History  Substance Use Topics  . Smoking status: Current Every Day Smoker -- 1.00 packs/day for 60 years    Types: Cigarettes  . Smokeless tobacco: Never Used  . Alcohol Use: 4.2 - 8.4 oz/week    7-14 Standard drinks or equivalent per week     Comment: 1-2 glass of wine nightly.      Review of Systems  Constitutional:   No fever or chills. No weight changes Eyes:   No blurry vision or double vision.  ENT:   No sore throat. Cardiovascular:   No chest pain. Respiratory:   No dyspnea or cough. Gastrointestinal:   Negative for abdominal pain, vomiting and diarrhea.  No BRBPR or melena. Genitourinary:   Negative for dysuria, urinary retention, bloody urine, or difficulty urinating. Musculoskeletal:   Positive left flank pain. Skin:   Negative for rash. Neurological:   Negative for headaches, focal weakness or numbness. Psychiatric:  No anxiety or depression.   Endocrine:  No hot/cold intolerance, changes in energy, or sleep difficulty.  10-point ROS otherwise negative.  ____________________________________________   PHYSICAL EXAM:  VITAL SIGNS: ED Triage Vitals  Enc Vitals Group     BP 06/14/15 1252 154/106 mmHg     Pulse Rate 06/14/15 1252 107     Resp 06/14/15 1252 22     Temp 06/14/15 1252 97.7 F (36.5 C)     Temp Source 06/14/15 1252 Oral     SpO2 06/14/15 1252 97 %     Weight 06/14/15 1252 104 lb (47.174 kg)     Height 06/14/15 1252 5\' 6"  (1.676 m)     Head Cir --      Peak Flow --      Pain Score 06/14/15 1252 9     Pain Loc --  Pain Edu? --      Excl. in [place]? --     Vital signs  reviewed, nursing assessments reviewed.   Constitutional:   Alert and oriented. Well appearing and in no distress. Eyes:   No scleral icterus. No conjunctival pallor. PERRL. EOMI ENT   Head:   Normocephalic and atraumatic.   Nose:   No congestion/rhinnorhea. No septal hematoma   Mouth/Throat:   MMM, no pharyngeal erythema. No peritonsillar mass. No uvula shift.   Neck:   No stridor. No SubQ emphysema. No meningismus. Hematological/Lymphatic/Immunilogical:   No cervical lymphadenopathy. Cardiovascular:   RRR. Normal and symmetric distal pulses are present in all extremities. No murmurs, rubs, or gallops. Respiratory:   Normal respiratory effort without tachypnea nor retractions. Breath sounds are clear and equal bilaterally. No wheezes/rales/rhonchi. Gastrointestinal:   Soft and nontender. No distention. There is no CVA tenderness.  No rebound, rigidity, or guarding. Genitourinary:   deferred Musculoskeletal:   Nontender with normal range of motion in all extremities. No joint effusions.  No lower extremity tenderness.  No edema. Neurologic:   Normal speech and language.  CN 2-10 normal. Motor grossly intact. No pronator drift.  Normal gait. No gross focal neurologic deficits are appreciated.  Skin:    Skin is warm, dry and intact. No rash noted.  No petechiae, purpura, or bullae. Psychiatric:   Mood and affect are normal. Speech and behavior are normal. Patient exhibits appropriate insight and judgment.  ____________________________________________    LABS (pertinent positives/negatives) (all labs ordered are listed, but only abnormal results are displayed) Labs Reviewed  CBC - Abnormal; Notable for the following:    RBC 3.60 (*)    Hemoglobin 11.6 (*)    All other components within normal limits  URINALYSIS COMPLETEWITH MICROSCOPIC ([facility] ONLY) - Abnormal; Notable for the following:    Color, Urine STRAW (*)    APPearance CLEAR (*)    Hgb urine dipstick 1+ (*)     Leukocytes, UA 3+ (*)    Squamous Epithelial / LPF 0-5 (*)    All other components within normal limits  COMPREHENSIVE METABOLIC PANEL   ____________________________________________   EKG    ____________________________________________    RADIOLOGY  CT abdomen and pelvis unremarkable except for possible masslike lesion in descending colon  ____________________________________________   PROCEDURES   ____________________________________________   INITIAL IMPRESSION / ASSESSMENT AND PLAN / ED COURSE  Pertinent labs & imaging results that were available during my care of the patient were reviewed by me and considered in my medical decision making (see chart for details).  Patient presents with severe left flank pain. Toradol and Zofran. Labs are unremarkable, we'll check urine and CT.  ----------------------------------------- 4:43 PM on 06/14/2015 -----------------------------------------  Patient feeling much better. Tachycardia improved after adequate pain relief. Vital signs unremarkable, workup unremarkable, no UTI. No pyelonephritis. Low suspicion for perforation obstruction AAA dissection. No evidence of ureterolithiasis. We'll continue with naproxen and Zofran and have her follow-up with her primary care. There was a finding on CT suggestive of a colonic mass but this is inadequately evaluated on noncontrast CT. Patient informed, to follow up with primary care for further evaluation.     ____________________________________________   FINAL CLINICAL IMPRESSION(S) / ED DIAGNOSES  Final diagnoses:  Acute left flank pain      Carrie Mew, MD 06/14/15 [phone]

## 2015-06-14 NOTE — ED Notes (Signed)
Patient states left flank pain began during the night and is worsening. Patient states she has a history of kidney stones and has had 4 lithotripsy.

## 2015-06-14 NOTE — Discharge Instructions (Signed)
Abdominal Pain, Adult °Many things can cause abdominal pain. Usually, abdominal pain is not caused by a disease and will improve without treatment. It can often be observed and treated at home. Your health care provider will do a physical exam and possibly order blood tests and X-rays to help determine the seriousness of your pain. However, in many cases, more time must pass before a clear cause of the pain can be found. Before that point, your health care provider may not know if you need more testing or further treatment. °HOME CARE INSTRUCTIONS °Monitor your abdominal pain for any changes. The following actions may help to alleviate any discomfort you are experiencing: °· Only take over-the-counter or prescription medicines as directed by your health care provider. °· Do not take laxatives unless directed to do so by your health care provider. °· Try a clear liquid diet (broth, tea, or water) as directed by your health care provider. Slowly move to a bland diet as tolerated. °SEEK MEDICAL CARE IF: °· You have unexplained abdominal pain. °· You have abdominal pain associated with nausea or diarrhea. °· You have pain when you urinate or have a bowel movement. °· You experience abdominal pain that wakes you in the night. °· You have abdominal pain that is worsened or improved by eating food. °· You have abdominal pain that is worsened with eating fatty foods. °· You have a fever. °SEEK IMMEDIATE MEDICAL CARE IF: °· Your pain does not go away within 2 hours. °· You keep throwing up (vomiting). °· Your pain is felt only in portions of the abdomen, such as the right side or the left lower portion of the abdomen. °· You pass bloody or black tarry stools. °MAKE SURE YOU: °· Understand these instructions. °· Will watch your condition. °· Will get help right away if you are not doing well or get worse. °  °This information is not intended to replace advice given to you by your health care provider. Make sure you discuss  any questions you have with your health care provider. °  °Document Released: 03/21/2005 Document Revised: 03/02/2015 Document Reviewed: 02/18/2013 °Elsevier Interactive Patient Education ©2016 [employer] ° °Flank Pain °Flank pain refers to pain that is located on the side of the body between the upper abdomen and the back. The pain may occur over a short period of time (acute) or may be long-term or reoccurring (chronic). It may be mild or severe. Flank pain can be caused by many things. °CAUSES  °Some of the more common causes of flank pain include: °· Muscle strains.   °· Muscle spasms.   °· A disease of your spine (vertebral disk disease).   °· A lung infection (pneumonia).   °· Fluid around your lungs (pulmonary edema).   °· A kidney infection.   °· Kidney stones.   °· A very painful skin rash caused by the chickenpox virus (shingles).   °· Gallbladder disease.   °HOME CARE INSTRUCTIONS  °Home care will depend on the cause of your pain. In general, °· Rest as directed by your caregiver. °· Drink enough fluids to keep your urine clear or pale yellow. °· Only take over-the-counter or prescription medicines as directed by your caregiver. Some medicines may help relieve the pain. °· Tell your caregiver about any changes in your pain. °· Follow up with your caregiver as directed. °SEEK IMMEDIATE MEDICAL CARE IF:  °· Your pain is not controlled with medicine.   °· You have new or worsening symptoms. °· Your pain increases.   °· You have abdominal   pain.   °· You have shortness of breath.   °· You have persistent nausea or vomiting.   °· You have swelling in your abdomen.   °· You feel faint or pass out.   °· You have blood in your urine. °· You have a fever or persistent symptoms for more than 2-3 days. °· You have a fever and your symptoms suddenly get worse. °MAKE SURE YOU:  °· Understand these instructions. °· Will watch your condition. °· Will get help right away if you are not doing well or get worse. °  °This  information is not intended to replace advice given to you by your health care provider. Make sure you discuss any questions you have with your health care provider. °  °Document Released: 08/02/2005 Document Revised: 03/05/2012 Document Reviewed: 01/24/2012 °Elsevier Interactive Patient Education ©2016 Elsevier Inc. ° °

## 2015-06-14 NOTE — ED Notes (Signed)
This nurse got in touch with [name] who is on the way to pick up patient.  Patient walked to lobby to wait.

## 2015-07-19 ENCOUNTER — Encounter: Payer: Self-pay | Admitting: Family Medicine

## 2015-07-19 ENCOUNTER — Ambulatory Visit (INDEPENDENT_AMBULATORY_CARE_PROVIDER_SITE_OTHER): Payer: Medicare Other | Admitting: Family Medicine

## 2015-07-19 VITALS — BP 151/82 | HR 97 | Temp 98.6°F | Resp 16 | Ht 66.0 in | Wt 121.0 lb

## 2015-07-19 DIAGNOSIS — J40 Bronchitis, not specified as acute or chronic: Secondary | ICD-10-CM | POA: Diagnosis not present

## 2015-07-19 MED ORDER — PREDNISONE 10 MG PO TABS: ORAL_TABLET | ORAL | Status: DC

## 2015-07-19 MED ORDER — DOXYCYCLINE HYCLATE 100 MG PO TABS: 100.0000 mg | ORAL_TABLET | Freq: Two times a day (BID) | ORAL | Status: DC

## 2015-07-19 NOTE — Progress Notes (Signed)
Name: Paula Frank   MRN: [id number]    DOB: [birth year]   Date:07/19/2015       Progress Note  Subjective  Chief Complaint  Chief Complaint  Patient presents with  . Cough  . Constipation    HPI Here c/o cough and congestion x 2 weeks.  She has a production of sputum that is gray.  No fever.  No SOB.  Still smoking.  Some extra wheezing at night with lying down.  C/o constipation.  She can move stools but they are veery slow moving.   She has just started Colace as a stool softener.  No problem-specific assessment & plan notes found for this encounter.   Past Medical History  Diagnosis Date  . Lupus (systemic lupus erythematosus) ([place])   . Discoid lupus   . Gastritis   . Chronic pain   . Restless leg   . Anxiety   . Diverticulitis   . Chronic kidney disease   . Seizures ([place])     Social History  Substance Use Topics  . Smoking status: Current Every Day Smoker -- 1.00 packs/day for 60 years    Types: Cigarettes  . Smokeless tobacco: Never Used  . Alcohol Use: 4.2 - 8.4 oz/week    7-14 Standard drinks or equivalent per week     Comment: 1-2 glass of wine nightly.       Current outpatient prescriptions:  .  Armodafinil (NUVIGIL) 250 MG tablet, Take 250 mg by mouth daily., Disp: , Rfl:  .  buPROPion (WELLBUTRIN SR) 100 MG 12 hr tablet, Take 100 mg by mouth daily., Disp: , Rfl:  .  clobetasol ointment (TEMOVATE) 2.40 %, Apply 1 application topically 2 (two) times daily. Apply to arms and shins., Disp: , Rfl:  .  diphenhydrAMINE (BENADRYL) 25 mg capsule, Take 25 mg by mouth 2 (two) times daily as needed., Disp: , Rfl:  .  doxepin (SINEQUAN) 25 MG capsule, Take 1 capsule (25 mg total) by mouth at bedtime., Disp: 30 capsule, Rfl: 6 .  halobetasol (ULTRAVATE) 0.05 % ointment, Apply 1 application topically 2 (two) times daily., Disp: , Rfl: 5 .  levETIRAcetam (KEPPRA) 500 MG tablet, Take 500 mg by mouth daily., Disp: , Rfl:  .  montelukast (SINGULAIR) 10 MG tablet, Take  1 tablet (10 mg total) by mouth daily., Disp: 30 tablet, Rfl: 6 .  naproxen (NAPROSYN) 500 MG tablet, Take 1 tablet (500 mg total) by mouth 2 (two) times daily with a meal., Disp: 20 tablet, Rfl: 0 .  QUEtiapine (SEROQUEL) 400 MG tablet, Take 400 mg by mouth at bedtime., Disp: , Rfl:  .  rOPINIRole (REQUIP) 1 MG tablet, Take 1 mg in the morning and 2 mg at night, Disp: , Rfl:  .  triamcinolone cream (KENALOG) 0.1 %, Apply 1 application topically 2 (two) times daily., Disp: 60 g, Rfl: 2 .  divalproex (DEPAKOTE ER) 250 MG 24 hr tablet, Take 1 tablet by mouth daily. Reported on 07/19/2015, Disp: , Rfl:   Allergies  Allergen Reactions  . Amoxicillin Other (See Comments)  . Ioxaglate     Other reaction(s): Unknown  . Ivp Dye [Iodinated Diagnostic Agents]     Other reaction(s): Unknown  . Levaquin [Levofloxacin In D5w]   . Levofloxacin     Other reaction(s): Unknown  . Moxifloxacin Hcl In Nacl     Other reaction(s): Unknown  . Erythromycin Rash    Canker sore  . Sulfa Antibiotics Rash    Other  reaction(s): Unknown    Review of Systems  Constitutional: Positive for chills and malaise/fatigue. Negative for fever and weight loss.  HENT: Negative for congestion, hearing loss and sore throat.   Eyes: Negative for blurred vision and double vision.  Respiratory: Positive for cough, sputum production and wheezing. Negative for shortness of breath.   Cardiovascular: Negative for chest pain, palpitations and leg swelling.  Gastrointestinal: Positive for constipation. Negative for heartburn, abdominal pain and blood in stool.  Genitourinary: Negative for dysuria, urgency and frequency.  Skin: Positive for rash.  Neurological: Negative for weakness and headaches.      Objective  Filed Vitals:   07/19/15 1404  BP: 151/82  Pulse: 97  Temp: 98.6 F (37 C)  TempSrc: Oral  Resp: 16  Height: 5' 6"  (1.676 m)  Weight: 121 lb (54.885 kg)     Physical Exam  Constitutional: She is oriented  to person, place, and time and well-developed, well-nourished, and in no distress. No distress.  HENT:  Head: Normocephalic and atraumatic.  Right Ear: External ear normal.  Left Ear: External ear normal.  Nose: Nose normal.  Mouth/Throat: Oropharynx is clear and moist.  Cardiovascular: Regular rhythm.  Tachycardia present.   Pulmonary/Chest: No respiratory distress. She has wheezes. She has no rales.  Coarse breath sound throughout bilaterally.  Abdominal: Soft. Bowel sounds are normal. She exhibits no distension and no mass. There is no tenderness.  Musculoskeletal: She exhibits no edema.  Lymphadenopathy:    She has no cervical adenopathy.  Neurological: She is alert and oriented to person, place, and time.  Vitals reviewed.     Recent Results (from the past 2160 hour(s))  Comprehensive metabolic panel     Status: None   Collection Time: 06/14/15  1:04 PM  Result Value Ref Range   Sodium 138 135 - 145 mmol/L   Potassium 4.4 3.5 - 5.1 mmol/L   Chloride 106 101 - 111 mmol/L   CO2 26 22 - 32 mmol/L   Glucose, Bld 86 65 - 99 mg/dL   BUN 14 6 - 20 mg/dL   Creatinine, Ser 0.59 0.44 - 1.00 mg/dL   Calcium 9.3 8.9 - 10.3 mg/dL   Total Protein 7.6 6.5 - 8.1 g/dL   Albumin 3.7 3.5 - 5.0 g/dL   AST 28 15 - 41 U/L   ALT 21 14 - 54 U/L   Alkaline Phosphatase 126 38 - 126 U/L   Total Bilirubin 0.5 0.3 - 1.2 mg/dL   GFR calc non Af Amer >60 >60 mL/min   GFR calc Af Amer >60 >60 mL/min    Comment: (NOTE) The eGFR has been calculated using the CKD EPI equation. This calculation has not been validated in all clinical situations. eGFR's persistently <60 mL/min signify possible Chronic Kidney Disease.    Anion gap 6 5 - 15  CBC     Status: Abnormal   Collection Time: 06/14/15  1:04 PM  Result Value Ref Range   WBC 4.9 3.6 - 11.0 K/uL   RBC 3.60 (L) 3.80 - 5.20 MIL/uL   Hemoglobin 11.6 (L) 12.0 - 16.0 g/dL   HCT 35.3 35.0 - 47.0 %   MCV 98.0 80.0 - 100.0 fL   MCH 32.1 26.0 - 34.0  pg   MCHC 32.8 32.0 - 36.0 g/dL   RDW 14.3 11.5 - 14.5 %   Platelets 429 150 - 440 K/uL  Urinalysis complete, with microscopic ([facility] only)     Status: Abnormal   Collection Time:  06/14/15  3:35 PM  Result Value Ref Range   Color, Urine STRAW (A) YELLOW   APPearance CLEAR (A) CLEAR   Glucose, UA NEGATIVE NEGATIVE mg/dL   Bilirubin Urine NEGATIVE NEGATIVE   Ketones, ur NEGATIVE NEGATIVE mg/dL   Specific Gravity, Urine 1.011 1.005 - 1.030   Hgb urine dipstick 1+ (A) NEGATIVE   pH 6.0 5.0 - 8.0   Protein, ur NEGATIVE NEGATIVE mg/dL   Nitrite NEGATIVE NEGATIVE   Leukocytes, UA 3+ (A) NEGATIVE   RBC / HPF 0-5 0 - 5 RBC/hpf   WBC, UA 6-30 0 - 5 WBC/hpf   Bacteria, UA NONE SEEN NONE SEEN   Squamous Epithelial / LPF 0-5 (A) NONE SEEN     Assessment & Plan 1. Bronchitis  - doxycycline (VIBRA-TABS) 100 MG tablet; Take 1 tablet (100 mg total) by mouth 2 (two) times daily.  Dispense: 20 tablet; Refill: 0 - predniSONE (DELTASONE) 10 MG tablet; Take 3 tabs daily x 2 days, then 2 tabs daily for 2 days, then 1 tab daily for 2 days (3, 3, 2, 2, 1, 1.)  Dispense: 12 tablet; Refill: 0

## 2015-07-19 NOTE — Patient Instructions (Signed)
Take Mucinex DM for cough if needed.

## 2015-07-25 DIAGNOSIS — L299 Pruritus, unspecified: Secondary | ICD-10-CM | POA: Diagnosis not present

## 2015-07-25 DIAGNOSIS — F3289 Other specified depressive episodes: Secondary | ICD-10-CM | POA: Diagnosis not present

## 2015-08-01 ENCOUNTER — Telehealth: Payer: Self-pay | Admitting: Family Medicine

## 2015-08-01 NOTE — Telephone Encounter (Signed)
Called several time the number that was given but no one picks up the phone this is for [facility] and they want to discuss further to the physician regarding this pt.

## 2015-08-01 NOTE — Telephone Encounter (Signed)
Dr. Zandra Abts with [facility] thinks pt might be better served with a social worker coming to house to help get her to appts and see what the source of her itching could be whether anxiety or depression.  Please call [phone]

## 2015-08-01 NOTE — Telephone Encounter (Signed)
OK for [facility] home health evaluation re: home environment and her generalized worsening debility.-jh

## 2015-08-02 NOTE — Telephone Encounter (Signed)
Referral has been faxed on 08/02/15 and pt is aware about having [facility] nurse for her care.

## 2015-08-03 ENCOUNTER — Other Ambulatory Visit: Payer: Self-pay

## 2015-08-03 DIAGNOSIS — N189 Chronic kidney disease, unspecified: Secondary | ICD-10-CM

## 2015-08-03 NOTE — Progress Notes (Signed)
This encounter was created in error - please disregard.

## 2015-08-03 NOTE — Patient Outreach (Signed)
[place] [facility]) Care Management  08/03/2015  Paula Frank Oct 11, 1940 [id number]   Telephone Screen  Referral Date: 08/02/15 Referral Source: MD office(Dr. Luan Pulling) Referral Reason: "worsening generalized debility: severe neuro dermatitis"  Outreach attempt # 1 to patient. Patient reached and screening completed.  Social: Patient resides in the home with her son and a friend. She is independent with her ADLs. She states she can drive but doesn't feel comfortable driving lately so relies on family for transportation. She reports several falls within past few months. She complains of being "whobbly". She does have walker in the home but admits to not using it. Most recent fall was last week. She complains of bruising to hip/back area as a result of fall.   Conditions: Patient has h/o seizures, lupus, restless leg, anxiety,depression and CKD. She states she is not feeling well. She reports being more anxious today. She states she is "full of anxiety today." She called MD office to request something for anxiety and is waiting to hear back from office. Patient admits to depression. Depression screening completed. She became tearful as she shares the stories of the death of her son and then her spouse within a year time frame. This happened over four years ago but patient states she is having a hard time coping. She saw psych MD in Dec. 2016. However, patient reports she feels "more comfortable and prefers to speak with SW" regarding her mental health issues. Patient also complaining of trouble sleeping at night which is making her weak and groggy the next day. She stated MD was aware of these issues. She reports she has a "weird old rash on legs/arm." She was referred to dermatologist and given med to take which she reports is not working and helping with the rash.  Medications: Patient denies any issues with affordability. She reports she takes less than 10 meds.    Consent: Patient  gave verbal consent for [facility] services.   Plan: RN CM will notify [facility] administrative assistant of case status. RN CM will send [facility] community referral for further in home eval and assessment of patient. RN CM will send [facility] SW referral for mental health resources and support. RN CM provided patient with [facility] 24 hr Nurse Line contact info. RN CM reviewed with patient falls/safety prevention.    Enzo Montgomery, RN,BSN,CCM [place] Management Telephonic Care Management Coordinator Direct Phone: [phone] Toll Free: [phone] Fax: [phone]

## 2015-08-05 ENCOUNTER — Other Ambulatory Visit: Payer: Self-pay | Admitting: *Deleted

## 2015-08-05 NOTE — Patient Outreach (Signed)
Followed on referral from [place] for community nurse case management services (MD referral 2/7).   Spoke with pt, HIPPA verified.  Home visit scheduled for 2/13.     Zara Chess.   [place] Care Management  [phone]

## 2015-08-05 NOTE — Patient Outreach (Signed)
[place] [facility]) Care Management  08/05/2015  Paula Frank 12/19/1940 [id number]  Phone call to patient to schedule initial home visit.  Patient tearful, denies thoughts of harm to self and others. Spoke with patient briefly as she was having lunch.   Agrees to home visit.  Home visit scheduled for 08/09/15 at 12 pm.   [place], [place] Management [phone]

## 2015-08-08 ENCOUNTER — Encounter: Payer: Self-pay | Admitting: *Deleted

## 2015-08-08 ENCOUNTER — Other Ambulatory Visit: Payer: Self-pay | Admitting: *Deleted

## 2015-08-08 DIAGNOSIS — F3112 Bipolar disorder, current episode manic without psychotic features, moderate: Secondary | ICD-10-CM | POA: Diagnosis not present

## 2015-08-08 DIAGNOSIS — G3184 Mild cognitive impairment, so stated: Secondary | ICD-10-CM | POA: Diagnosis not present

## 2015-08-08 DIAGNOSIS — F411 Generalized anxiety disorder: Secondary | ICD-10-CM | POA: Diagnosis not present

## 2015-08-08 NOTE — Patient Outreach (Addendum)
[place] [facility]) Care Management   08/08/2015  Paula Frank May 26, 1941 [id number]  Paula Frank is an 75 y.o. female  Subjective: Pt reports her biggest problem is remembering.  Pt reports friend and son stay with her, help as needed.  Pt reports on rash all over her body,itchey, medication not working.  Pt reports to have lab work done today to find cause of rash.  Pt reports ran out of Nuvigil 2/10, called MD office over the weekend, rx was to be called in, to check today at pharmacy.  Pt reports the medication gives her a lift, helps with narcolepsy.   Objective:   Filed Vitals:   08/08/15 1057  BP: 148/88  Pulse: 90  Resp: 24    ROS  Physical Exam  Constitutional: She is oriented to person, place, and time. She appears well-developed.  Thin, appetite compromised.   Cardiovascular: Normal rate and regular rhythm.   Respiratory: Effort normal and breath sounds normal.  GI: Soft.  Musculoskeletal:  Dizzy sitting to standing.    Neurological: She is alert and oriented to person, place, and time.  Skin: Rash noted.  On legs, arms.    Psychiatric: She has a normal mood and affect. Her behavior is normal. Judgment and thought content normal.    Current Medications:  Reviewed with pt  Current Outpatient Prescriptions  Medication Sig Dispense Refill  . buPROPion (WELLBUTRIN SR) 100 MG 12 hr tablet Take 100 mg by mouth daily.    . clobetasol ointment (TEMOVATE) AB-123456789 % Apply 1 application topically 2 (two) times daily. Apply to arms and shins.    . diphenhydrAMINE (BENADRYL) 25 mg capsule Take 25 mg by mouth 2 (two) times daily as needed.    . doxepin (SINEQUAN) 25 MG capsule Take 1 capsule (25 mg total) by mouth at bedtime. 30 capsule 6  . doxycycline (VIBRA-TABS) 100 MG tablet Take 1 tablet (100 mg total) by mouth 2 (two) times daily. 20 tablet 0  . halobetasol (ULTRAVATE) 0.05 % ointment Apply 1 application topically 2 (two) times daily.  5  .  levETIRAcetam (KEPPRA) 500 MG tablet Take 500 mg by mouth daily.    . QUEtiapine (SEROQUEL) 400 MG tablet Take 400 mg by mouth at bedtime.    Marland Kitchen rOPINIRole (REQUIP) 1 MG tablet Take 1 mg in the morning and 2 mg at night    . triamcinolone cream (KENALOG) 0.1 % Apply 1 application topically 2 (two) times daily. 60 g 2  . Armodafinil (NUVIGIL) 250 MG tablet Take 250 mg by mouth daily. Reported on 08/08/2015    . divalproex (DEPAKOTE ER) 250 MG 24 hr tablet Take 1 tablet by mouth daily. Reported on 08/08/2015    . montelukast (SINGULAIR) 10 MG tablet Take 1 tablet (10 mg total) by mouth daily. (Patient not taking: Reported on 08/08/2015) 30 tablet 6  . naproxen (NAPROSYN) 500 MG tablet Take 1 tablet (500 mg total) by mouth 2 (two) times daily with a meal. (Patient not taking: Reported on 08/08/2015) 20 tablet 0  . predniSONE (DELTASONE) 10 MG tablet Take 3 tabs daily x 2 days, then 2 tabs daily for 2 days, then 1 tab daily for 2 days (3, 3, 2, 2, 1, 1.) (Patient not taking: Reported on 08/08/2015) 12 tablet 0   No current facility-administered medications for this visit.    Functional Status:   In your present state of health, do you have any difficulty performing the following activities: 08/08/2015 06/03/2015  Hearing? N N  Vision? N N  Difficulty concentrating or making decisions? Y N  Walking or climbing stairs? Y N  Dressing or bathing? N N  Doing errands, shopping? Y N  Preparing Food and eating ? Y -  Using the Toilet? N -  In the past six months, have you accidently leaked urine? Y -  Do you have problems with loss of bowel control? N -  Managing your Medications? Y -  Managing your Finances? Y -  Housekeeping or managing your Housekeeping? Y -    Fall/Depression Screening:    PHQ 2/9 Scores 08/03/2015 06/03/2015 04/04/2015  PHQ - 2 Score 2 0 -  PHQ- 9 Score 11 - -  Exception Documentation - - Medical reason    Assessment:  HTN: BP today 148/88, pt on no medications for BP.                          Medication compliance- does her own medications, pill planner provided for better                            Organization                          Rash- noted on all extremities.                             Plan:   HTN- pt to check BP three times a week, record in [facility] calendar provided.              Pt to call pharmacy to see if rx for Nuvigil refilled, pick up.  Start using pill planner.               Rash- pt to have lab work done today,              Therapist, sports CM to provide community nurse case management services, next home visit 3/13.               Plan to inform Dr. Luan Pulling of [facility] involvement, route encounter/ letter via in basket  in [place].                   [facility] CM Care Plan Problem One        Most Recent Value   Care Plan Problem One  Hx of HTN -  elevated, currently not on medication   Role Documenting the Problem One  Care Management [place] for Problem One  Active   [facility] Long Term Goal (31-90 days)  Pt's BP would be withing normal limits in the next next 40 days    [facility] Long Term Goal Start Date  08/08/15   Interventions for Problem One Long Term Goal  Discussed with pt compliance with Low Na+ diet    [facility] CM Short Term Goal #1 (0-30 days)  Pt would check BP three times a week for the next 30 days    [facility] CM Short Term Goal #1 Start Date  08/08/15   Interventions for Short Term Goal #1  RN CM provided pt with a [facility] calendar to record readings.    [facility] CM Short Term Goal #2 (0-30 days)  Pt would have better understanding of Low Na+ diet in the next 30 days  [facility] CM Short Term Goal #2 Start Date  08/08/15   Interventions for Short Term Goal #2  Provided pt with information on Low Na+ diet, foods to avoid, how to readk food labels.      [facility] CM Care Plan Problem One        Most Recent Value   Care Plan Problem One  Hx of HTN -  elevated, currently not on medication   Role Documenting the Problem One  Care Management [place] for Problem One   Active   [facility]N Long Term Goal (31-90 days)  Pt's BP would be withing normal limits in the next next 40 days    THN Long Term Goal Start Date  08/08/15   Interventions for Problem One Long Term Goal  Discussed with pt compliance with Low Na+ diet    [facility] CM Short Term Goal #1 (0-30 days)  Pt would check BP three times a week for the next 30 days    [facility] CM Short Term Goal #1 Start Date  08/08/15   Interventions for Short Term Goal #1  RN CM provided pt with a [facility] calendar to record readings.    [facility] CM Short Term Goal #2 (0-30 days)  Pt would have better understanding of Low Na+ diet in the next 30 days    [facility] CM Short Term Goal #2 Start Date  08/08/15   Interventions for Short Term Goal #2  Provided pt with information on Low Na+ diet, foods to avoid, how to readk food labels.       Zara Chess.   [place] Care Management  [phone]

## 2015-08-09 ENCOUNTER — Telehealth: Payer: Self-pay

## 2015-08-09 ENCOUNTER — Encounter: Payer: Self-pay | Admitting: *Deleted

## 2015-08-09 ENCOUNTER — Other Ambulatory Visit: Payer: Self-pay | Admitting: *Deleted

## 2015-08-09 NOTE — Telephone Encounter (Signed)
Called to tell her that the Dahlia Byes has to be refilled by Psych. SH esaid she had disagreement with Psych and has been given list of new ones but does not have appt yet and needs this medication. She has not slept well and is very anxious and depressed. She is asking that we fill at least a month.

## 2015-08-09 NOTE — Telephone Encounter (Signed)
Left detailed message on [name] cell. Patient advised me to. I have advised to go to ER if can not wait for Psych appt. [facility]

## 2015-08-09 NOTE — Patient Outreach (Signed)
[place] [facility]) Care Management  [facility] Social Work  08/09/2015  Paula Frank 16-May-1941 [id number]  Subjective:  Patient is a 75 year old female.  Objective:   Current Medications:  Current Outpatient Prescriptions  Medication Sig Dispense Refill  . Armodafinil (NUVIGIL) 250 MG tablet Take 250 mg by mouth daily. Reported on 08/08/2015    . buPROPion (WELLBUTRIN SR) 100 MG 12 hr tablet Take 100 mg by mouth daily.    . clobetasol ointment (TEMOVATE) AB-123456789 % Apply 1 application topically 2 (two) times daily. Apply to arms and shins.    . diphenhydrAMINE (BENADRYL) 25 mg capsule Take 25 mg by mouth 2 (two) times daily as needed.    . divalproex (DEPAKOTE ER) 250 MG 24 hr tablet Take 1 tablet by mouth daily. Reported on 08/08/2015    . doxepin (SINEQUAN) 25 MG capsule Take 1 capsule (25 mg total) by mouth at bedtime. 30 capsule 6  . doxycycline (VIBRA-TABS) 100 MG tablet Take 1 tablet (100 mg total) by mouth 2 (two) times daily. 20 tablet 0  . halobetasol (ULTRAVATE) 0.05 % ointment Apply 1 application topically 2 (two) times daily.  5  . levETIRAcetam (KEPPRA) 500 MG tablet Take 500 mg by mouth daily.    . montelukast (SINGULAIR) 10 MG tablet Take 1 tablet (10 mg total) by mouth daily. (Patient not taking: Reported on 08/08/2015) 30 tablet 6  . naproxen (NAPROSYN) 500 MG tablet Take 1 tablet (500 mg total) by mouth 2 (two) times daily with a meal. (Patient not taking: Reported on 08/08/2015) 20 tablet 0  . predniSONE (DELTASONE) 10 MG tablet Take 3 tabs daily x 2 days, then 2 tabs daily for 2 days, then 1 tab daily for 2 days (3, 3, 2, 2, 1, 1.) (Patient not taking: Reported on 08/08/2015) 12 tablet 0  . QUEtiapine (SEROQUEL) 400 MG tablet Take 400 mg by mouth at bedtime.    Marland Kitchen rOPINIRole (REQUIP) 1 MG tablet Take 1 mg in the morning and 2 mg at night    . triamcinolone cream (KENALOG) 0.1 % Apply 1 application topically 2 (two) times daily. 60 g 2   No current  facility-administered medications for this visit.    Functional Status:  In your present state of health, do you have any difficulty performing the following activities: 08/08/2015 06/03/2015  Hearing? N N  Vision? N N  Difficulty concentrating or making decisions? Y N  Walking or climbing stairs? Y N  Dressing or bathing? N N  Doing errands, shopping? Y N  Preparing Food and eating ? Y -  Using the Toilet? N -  In the past six months, have you accidently leaked urine? Y -  Do you have problems with loss of bowel control? N -  Managing your Medications? Y -  Managing your Finances? Y -  Housekeeping or managing your Housekeeping? Y -    Fall/Depression Screening:  PHQ 2/9 Scores 08/03/2015 06/03/2015 04/04/2015  PHQ - 2 Score 2 0 -  PHQ- 9 Score 11 - -  Exception Documentation - - Medical reason    Assessment:  Home visit to patient's home .  Patient has 3 sons, 1 died from cancer in October 03, 2009, one now living in [place] and 1,  living with patient.  Patient's boyfriend/caregiver also resides in the home.    Per patient, she tends to forget a lot, especially details.   Patient lost her wallet in [place].  Police report made.  Admits to having  early signs of Dementia and feels that she will need to start making plans for herself if and when her condition progresses. She refuses to go back to an assisted living and she does not want to live with her sister or brother.   Patient has not filled her pill box, stating that she does not have all of her medications yet. Patient reports needing her Nuvigal re-filled as it helps give her the "extra push"  Patient  originally from [place], moved to [place] in 2012, history of living in an assisted living- [place].  Per patient the rent went up and had to move.  Moved with boyfriend been in current apartment, who also has medical issues.  Nuvigil 500 per month helps give her the extra push along with her antidepressants.  Patient currently receives  social security only.  Patient tearfully discussed the death of her son and other losses in her life.  States seeing her psychiatrist  in [place] yesterday,  who gave her referral to an agency in [name] where she can see a therapist and psychiatrist locally. Patient strongly encouraged to contact this agency for an apointment as soon as possible.  Patient has advanced directive document and will review.  Patient very friendly and engaging during the visit.  Hyperverbal admits to depression and anxiety.  Denies thoughts to harm self.    Plan: Patient encouraged to make an appointment with her local therapist and psychiatrist.           This social worker will follow up with patient in 1 week to ensure that the appointment is made.    [facility] CM Care Plan Problem One        Most Recent Value   Care Plan Problem One  mental health   Role Documenting the Problem One  Clinical Social Worker   Care Plan for Problem One  Active   [facility] CM Short Term Goal #1 (0-30 days)  patient to schedule appointment with her psychiatrist and therapist within the next 2 weeks   [facility] CM Short Term Goal #1 Start Date  08/09/15   Interventions for Short Term Goal #1  patient encouraged to make an appointment with her therapist and psychiatrist as soon as possible     Sheralyn Boatman [facility] Care Management [phone]

## 2015-08-09 NOTE — Telephone Encounter (Signed)
This is a controlled medication and I cannot fill it as I don't prescribe this medication to anyone.  She would have to see a Psych or go to ER behavioral unit to see if they would fill this for her.-jh

## 2015-08-10 ENCOUNTER — Encounter: Payer: Self-pay | Admitting: *Deleted

## 2015-08-12 ENCOUNTER — Telehealth: Payer: Self-pay | Admitting: Family Medicine

## 2015-08-12 ENCOUNTER — Other Ambulatory Visit: Payer: Self-pay | Admitting: Family Medicine

## 2015-08-12 NOTE — Telephone Encounter (Signed)
Pt needs a refill on requip and nuvigil.  Her call back number is [phone]

## 2015-08-12 NOTE — Telephone Encounter (Signed)
Patient has been advised to contact ER for medication management. Patient is very upset and crying as she is very depressed about loss of her son. Patient does not want to go to ER.

## 2015-08-12 NOTE — Telephone Encounter (Signed)
Patient was advised to get these from psych. I called to leave message and told [name]. Because thi sis controlled we can not write per Luan Pulling. This was discussed earlier this week. She needs to find a new psychiatrist because he knows she left the old one but he said he is not going to write this. The day I left message I instructed them to go to ER if they could not wait. I can not call from [place] but I did want to let someone know this.

## 2015-08-13 ENCOUNTER — Other Ambulatory Visit: Payer: Self-pay | Admitting: Family Medicine

## 2015-08-15 ENCOUNTER — Other Ambulatory Visit: Payer: Self-pay | Admitting: *Deleted

## 2015-08-15 NOTE — Telephone Encounter (Signed)
Patient does have appt 1st week of March. She says that is the 1st available and she needs her medicine.

## 2015-08-15 NOTE — Patient Outreach (Signed)
[place] [facility]) Care Management  08/15/2015  RYA BISAILLON 1940/06/27 [id number]   Phone call to patient to follow up with patient and her mental health needs.  Patient stated that she had just woken up and did not want to talk.  Patient did say that she was able to follow up with the new psychiatrist who will do both her medication management and therapy.  Patient could not remember the agency or the doctor's name.  Patient requested that this social worker call back later.   Plan:  This Education officer, museum will call patient at approximately 4 pm today.     Sheralyn Boatman [facility] Care Management [phone]

## 2015-08-15 NOTE — Telephone Encounter (Signed)
I'm sorry, but I cannot prescribe this medication for her.  She can go to behavioral health at [facility] if she wishes to see if they will prescribe this for her.-jh

## 2015-08-15 NOTE — Telephone Encounter (Signed)
Check to see if patient got in to new Psych.-jh

## 2015-08-22 ENCOUNTER — Other Ambulatory Visit: Payer: Self-pay | Admitting: *Deleted

## 2015-08-22 NOTE — Patient Outreach (Signed)
[place] [facility]) Care Management  08/22/2015  Paula Frank Jul 19, 1940 [id number]   Phone call to patient to follow up on compliance with psychiatry appointment.  Spoke with her son Christia Reading who is on consent who discussed concern that patient is not progressing .  She did see her new psychiatrist and plans to return for a second appointment, however he was not sure of the name of the doctor.  Per patient's son, he is concerned that patient has lost control of her ability to manager her finances and the amount of alcohol she consumes. Per patient's son, he is not sure how long he will be able to provide care for patient based on his own medical issues.   Plan:  [name] will contact this social worker this afternoon to schedule a family meeting to address care needs.   Sheralyn Boatman [facility] Care Management [phone]

## 2015-08-22 NOTE — Patient Outreach (Signed)
[place] [facility]) Care Management  08/22/2015  Paula Frank 08-15-1940 [id number]   Phone call from patient stating that she had become increasingly confused and forgetful.  Patient tearful stating that she has become more depressed lately due to financial issues and conflicts with significant other.  Patient's safety assessed.  Patient states that she has no thoughts of harming herself or others.  Patient states that her first appointment with her new psychiatrist is 08/26/14, however she does not know the time.  Patient has called and left a message for a return call.  This Education officer, museum provided patient with emotional support and emphasized self care.  Plan:  This social worker follow up with patient on 08/23/15 at 3 pm.    [place], [place] Management [phone]

## 2015-08-23 ENCOUNTER — Other Ambulatory Visit: Payer: Self-pay | Admitting: *Deleted

## 2015-08-23 NOTE — Patient Outreach (Signed)
[place] [facility] [facility]) Care Management  08/23/2015  Paula Frank [birth year] [id number]   Follow up phone call to patient today at 3:00pm.  Patient continues to report symptoms of depression and anxiety.  Patient crying intermittently stating that she is overwhelmed and not knowing what to do. No known trigger. Per patient "this has been the worse I have felt in a long time"   "I can't find any one to give me something that works long term"   Patient had questions about her annuity and will be going to the bank on Thursday to meet with someone to address her questions.  Patient further states that she has an appointment with her Psychiatrist on 08/26/15 but did not know what time.   This Education officer, museum assessed for self harm.  Patient adamant that she would never harm herself or anyone else.  She.   declined visit to the ED.  This social work emphasized keeping her appointment with her Psychiatrist on 08/26/15.  Encouraged her to contact the Psychiatrist to confirm date and time of the appointment.  Plan:  This social worker will contact patient following her appointment with her Psychiatrist on 08/26/15.    Sheralyn Boatman [facility] Care Management [phone]

## 2015-08-24 DIAGNOSIS — F332 Major depressive disorder, recurrent severe without psychotic features: Secondary | ICD-10-CM | POA: Diagnosis not present

## 2015-08-31 ENCOUNTER — Telehealth: Payer: Self-pay | Admitting: Family Medicine

## 2015-08-31 DIAGNOSIS — L299 Pruritus, unspecified: Secondary | ICD-10-CM | POA: Insufficient documentation

## 2015-08-31 DIAGNOSIS — L509 Urticaria, unspecified: Secondary | ICD-10-CM

## 2015-08-31 NOTE — Telephone Encounter (Signed)
Pt said she still has the rash and asked what she could do next.  She dermatologist didn't seem help.  Please call [phone]

## 2015-09-02 ENCOUNTER — Other Ambulatory Visit: Payer: Self-pay | Admitting: *Deleted

## 2015-09-02 NOTE — Patient Outreach (Addendum)
[place] [facility]) Care Management  09/02/2015  KERINA LAMOREUX 1940-11-21 [id number]   Follow up phone call to patient regarding compliance with appointment with Psychiatrist.  Voicemail message left for a return call.   Plan:  This social worker will attempt to contact patient on 09/05/15.  Sheralyn Boatman [facility] Care Management [phone]

## 2015-09-05 ENCOUNTER — Encounter: Payer: Self-pay | Admitting: *Deleted

## 2015-09-05 ENCOUNTER — Other Ambulatory Visit: Payer: Self-pay | Admitting: *Deleted

## 2015-09-05 NOTE — Patient Outreach (Addendum)
[facility]) Care Management   09/05/2015  Paula Frank 1941/04/26 [id number]  Paula Frank is an 75 y.o. female  Subjective:  Pt reports on recent fall- was outdoors going up on an incline looking for rocks, slipped on dog stool, fell on glass causing 2 cuts next to right eye. Pt reports neighbors helped her, applied bandage, did not f/u at ED.   Pt reports no recent dizziness, making sure to eat breakfast, snacks in am/pm.   Pt reports she did f/u with dermatologist about rashes on her body, sent for lab work as well as instructed to see psychiatrist (find out if can be on higher dose of Prednisone).   Pt reports psychiatrist did not address the medication issue, wanted her to come back to which she called/cancelled f/u  appointment.  Pt reports she called dermatologist, waiting to hear back.   Objective:   Filed Vitals:   09/05/15 1028  BP: 128/80  Pulse: 96  Resp: 24    ROS  Physical Exam  Constitutional: She is oriented to person, place, and time. She appears well-developed and well-nourished.  Cardiovascular: Normal rate and regular rhythm.   Respiratory: Effort normal and breath sounds normal.  GI: Soft.  Musculoskeletal: Normal range of motion.  Neurological: She is alert and oriented to person, place, and time.  Skin: Skin is warm and dry. Rash noted.  Psychiatric: Her behavior is normal. Judgment and thought content normal.  Cried at times - regret of past and son's death.     Current Medications:  Reviewed with pt, reports compliance  Current Outpatient Prescriptions  Medication Sig Dispense Refill  . Armodafinil (NUVIGIL) 250 MG tablet Take 250 mg by mouth daily. Reported on 08/08/2015    . buPROPion (WELLBUTRIN SR) 100 MG 12 hr tablet Take 100 mg by mouth daily.    . clobetasol ointment (TEMOVATE) 4.49 % Apply 1 application topically 2 (two) times daily. Apply to arms and shins.    . diphenhydrAMINE (BENADRYL) 25 mg capsule Take 25 mg by  mouth 2 (two) times daily as needed. Reported on 09/05/2015    . doxepin (SINEQUAN) 25 MG capsule Take 1 capsule (25 mg total) by mouth at bedtime. 30 capsule 6  . halobetasol (ULTRAVATE) 0.05 % ointment Apply 1 application topically 2 (two) times daily.  5  . levETIRAcetam (KEPPRA) 500 MG tablet Take 500 mg by mouth daily.    . montelukast (SINGULAIR) 10 MG tablet Take 1 tablet (10 mg total) by mouth daily. 30 tablet 6  . oxymetazoline (AFRIN) 0.05 % nasal spray Place 1 spray into both nostrils 2 (two) times daily.    . QUEtiapine (SEROQUEL) 400 MG tablet Take 400 mg by mouth at bedtime. Reported on 09/05/2015    . rOPINIRole (REQUIP) 1 MG tablet TAKE 1 TABLET EVERY MORNING AND 2 TABLETS EVERY NIGHT PATIENT NEEDS APPOINTMENT 90 tablet 0  . triamcinolone cream (KENALOG) 0.1 % Apply 1 application topically 2 (two) times daily. 60 g 2  . divalproex (DEPAKOTE ER) 250 MG 24 hr tablet Take 1 tablet by mouth daily. Reported on 09/05/2015    . doxycycline (VIBRA-TABS) 100 MG tablet Take 1 tablet (100 mg total) by mouth 2 (two) times daily. (Patient not taking: Reported on 09/05/2015) 20 tablet 0  . naproxen (NAPROSYN) 500 MG tablet Take 1 tablet (500 mg total) by mouth 2 (two) times daily with a meal. (Patient not taking: Reported on 08/08/2015) 20 tablet 0  . predniSONE (DELTASONE) 10  MG tablet Take 3 tabs daily x 2 days, then 2 tabs daily for 2 days, then 1 tab daily for 2 days (3, 3, 2, 2, 1, 1.) (Patient not taking: Reported on 08/08/2015) 12 tablet 0   No current facility-administered medications for this visit.    Functional Status:   In your present state of health, do you have any difficulty performing the following activities: 08/08/2015 06/03/2015  Hearing? N N  Vision? N N  Difficulty concentrating or making decisions? Y N  Walking or climbing stairs? Y N  Dressing or bathing? N N  Doing errands, shopping? Y N  Preparing Food and eating ? Y -  Using the Toilet? N -  In the past six months,  have you accidently leaked urine? Y -  Do you have problems with loss of bowel control? N -  Managing your Medications? Y -  Managing your Finances? Y -  Housekeeping or managing your Housekeeping? Y -    Fall/Depression Screening:    PHQ 2/9 Scores 08/03/2015 06/03/2015 04/04/2015  PHQ - 2 Score 2 0 -  PHQ- 9 Score 11 - -  Exception Documentation - - Medical reason    Assessment:   Arrived at pt's home, noted bruising under right eye, large bandage next to right eye. Pt reported recent fall, slipped when outdoors.    Bandage removed- 2 cuts (healing well), one measures 2 cm, one 1.5 cm                            BP status:  Today BP 128/80                                                                                  Plan:   Pt to f/u with dermatologist - chronic rash arms/legs.              As discussed with pt, plan to discharge from RN CM services, goals met.               Plan to inform Paula Frank [facility] CSW  Of discharge.              Plan to send Dr. Luan Pulling update letter (by in basket in [place]) informing of [place] still involved.   [facility] CM Care Plan Problem One        Most Recent Value   Care Plan Problem One  Hx of HTN- elevated, not on medication    Role Documenting the Problem One  Care Management [place] for Problem One  Active   [facility] Long Term Goal (31-90 days)  Pt's BP would be within normal limits in the next next 40 days    [facility] Long Term Goal Start Date  08/08/15   [facility] Long Term Goal Met Date  09/05/15 Barrie Folk met. ]   Interventions for Problem One Long Term Goal  Discussed with pt compliance with Low Na+ diet    [facility] CM Short Term Goal #2 (0-30 days)  Pt would have better understanding of Low Na+ diet in the next 30 days    [facility] CM Short  Term Goal #2 Start Date  08/08/15   [facility] CM Short Term Goal #2 Met Date  09/05/15 [met.  ]   Interventions for Short Term Goal #2  Provided pt with information on Low Na+ diet, foods to avoid, how to readk food labels.       Paula Frank.   [place] Care Management  [phone]

## 2015-09-07 ENCOUNTER — Other Ambulatory Visit: Payer: Self-pay | Admitting: *Deleted

## 2015-09-07 NOTE — Patient Outreach (Signed)
[facility] [facility]) Care Management  09/07/2015  Paula Frank June 13, 1941 [id number]  Phone call from patient stating that she needed additional referrals for Psychiatrist.  Per patient, she saw Dr. Clovis Riley for her initial appointment  and would like to change.  Patient given contact information for [facility] [phone] (walk in from  9am-4 pm for an assessment) and [facility] [phone] (walk in Monday-Wednesday-Friday 8-3 pm for an assessment.    Patient crying intermittently over the phone, denies thoughts of suicide or homicide ideation.  Declines need for Mobile Crisis or an emergency room visit.  Agrees to go to one of the above named mental health agencies by the end of the week for a mental health assessment.   Plan:  Home visit scheduled for Monday, 09/12/15 at 2:00pm.   Sheralyn Boatman [facility] Care Management [phone]

## 2015-09-09 ENCOUNTER — Other Ambulatory Visit: Payer: Self-pay | Admitting: *Deleted

## 2015-09-09 NOTE — Patient Outreach (Signed)
[facility]) Care Management  09/09/2015  Paula Frank [birth year] [id number]   Phone call from patient crying, confused because she could not remember why she had a list of mental health resources and what she needed to do with them.  This social worker reminded her that she was to use the resources to go for a walk in appointment to complete a  mental health assessment.  Patient remembered once explanation given.   Plan:  Home visit scheduled for 09/12/15.   Sheralyn Boatman [facility] Care Management [phone]

## 2015-09-12 ENCOUNTER — Other Ambulatory Visit: Payer: Self-pay | Admitting: *Deleted
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
## 2015-09-14 ENCOUNTER — Other Ambulatory Visit: Payer: Self-pay | Admitting: Family Medicine

## 2015-09-20 ENCOUNTER — Telehealth: Payer: Self-pay | Admitting: Family Medicine

## 2015-09-20 ENCOUNTER — Other Ambulatory Visit: Payer: Self-pay | Admitting: Family Medicine

## 2015-09-20 NOTE — Telephone Encounter (Signed)
Pt said there was some confusion at [facility] about refill on requip.  They told her it would be ready Saturday and she hasn't been able to get it.  She asked if someone could call the pharmacy and then let her know [phone]

## 2015-09-20 NOTE — Telephone Encounter (Signed)
I thought that her Psychiatrist was giving her the requip.  Call her and confirm.  If so, they would have to continue to get it for her.  If Pharmacy sees that I have given it to her, ok to refill for one month  Without refills.  It cannot be sent electronically I don't think..  Check about that also.-jh

## 2015-09-20 NOTE — Telephone Encounter (Signed)
I don't see in her chart that it was send do you want me to refill this one please suggest ?

## 2015-09-21 ENCOUNTER — Other Ambulatory Visit: Payer: Self-pay | Admitting: *Deleted

## 2015-09-21 ENCOUNTER — Other Ambulatory Visit: Payer: Self-pay | Admitting: Family Medicine

## 2015-09-21 NOTE — Patient Outreach (Signed)
[place] [facility]) Care Management  09/21/2015  Paula Frank 1940-09-19 [id number]   Phone call from patient's son Paula Frank stating that patient's had a very difficult  night, crying out and acting erratically.  Per patient's son "something has to be done today, I can't take it anymore"  Taking patient to the emergency room suggested and  refused "the won't do anything".  Paula Frank's request was to contact Dr. Luan Pulling to see if patient can be put back on Seroquel.  This Education officer, museum explained that I could not make that recommendation but I could contact the doctor to inform him about Paula Frank's concerns.  Phone call to patient's doctor's office. Dr. Luan Pulling not in today but spoke to CMA, Paula Frank who took the message regarding patient's medication concerns.  Dr. Luan Pulling will be in the office tomorrow.  [name] contacted regarding this, voicemail message left for a return call.  Phone call also made to patient.  Patient calm, denied having any suicide or homicide ideation. States that she had a bad headache last night that kept her awake.  Agreed that she needs to see a psychiatrist to manage her psychiatric medications.  Patient reminded of provided list provided in the past and encouraged to call by the next home visit,scheudled for 09/26/15 at 2:30pm   [place] Management [phone]

## 2015-09-21 NOTE — Telephone Encounter (Signed)
No answer x2 

## 2015-09-21 NOTE — Telephone Encounter (Signed)
Social worker Teacher, music from [place] her number is [phone]  had called  regarding pt's behavior and being up all night,  acting up,  yelling and her response was to go to ER but son refused to take Pamala Hurry at [place] thought may be it's from her med's Seroquel and she wanted to let someone in office know about Kylar's behavior and As per Amy this sound like her psychiatric management issue and social worker should contact them for further evaluation. I contact crystal and suggested Amy's advice and as per Crystal they will be seeing psychiatric sooner and trying to get Dalyla's appointment  may be within 2-3 days.

## 2015-09-26 ENCOUNTER — Other Ambulatory Visit: Payer: Self-pay | Admitting: *Deleted

## 2015-09-26 NOTE — Patient Outreach (Signed)
[place] [facility]) Care Management  [facility] Social Work  09/26/2015  Paula Frank 04-Apr-1941 [id number]  Subjective:  Patient is a 75 year old, female who reports concern that she is not able to get her seroquel and requip re-filled.  Per patient, she was told by her primary care doctor, that she will need these medications managed by a psychiatrist.  Objective:   Encounter Medications:  Outpatient Encounter Prescriptions as of 09/26/2015  Medication Sig Note  . Armodafinil (NUVIGIL) 250 MG tablet Take 250 mg by mouth daily. Reported on 08/08/2015 09/05/2015: rx refilled   . buPROPion (WELLBUTRIN SR) 100 MG 12 hr tablet Take 100 mg by mouth daily.   . clobetasol ointment (TEMOVATE) 3.00 % Apply 1 application topically 2 (two) times daily. Apply to arms and shins.   . diphenhydrAMINE (BENADRYL) 25 mg capsule Take 25 mg by mouth 2 (two) times daily as needed. Reported on 09/05/2015   . divalproex (DEPAKOTE ER) 250 MG 24 hr tablet Take 1 tablet by mouth daily. Reported on 09/05/2015 07/19/2015: Pt states Dr. Melrose Nakayama d/c  . doxepin (SINEQUAN) 25 MG capsule Take 1 capsule (25 mg total) by mouth at bedtime.   Marland Kitchen doxycycline (VIBRA-TABS) 100 MG tablet Take 1 tablet (100 mg total) by mouth 2 (two) times daily. (Patient not taking: Reported on 09/05/2015)   . halobetasol (ULTRAVATE) 0.05 % ointment Apply 1 application topically 2 (two) times daily.   Marland Kitchen levETIRAcetam (KEPPRA) 500 MG tablet Take 500 mg by mouth daily.   . montelukast (SINGULAIR) 10 MG tablet Take 1 tablet (10 mg total) by mouth daily. 08/08/2015: Pt did not renew, did not feel needed it, MD aware   . naproxen (NAPROSYN) 500 MG tablet Take 1 tablet (500 mg total) by mouth 2 (two) times daily with a meal. (Patient not taking: Reported on 08/08/2015)   . oxymetazoline (AFRIN) 0.05 % nasal spray Place 1 spray into both nostrils 2 (two) times daily.   . predniSONE (DELTASONE) 10 MG tablet Take 3 tabs daily x 2 days, then 2 tabs daily  for 2 days, then 1 tab daily for 2 days (3, 3, 2, 2, 1, 1.) (Patient not taking: Reported on 08/08/2015) 08/08/2015: Completed   . QUEtiapine (SEROQUEL) 400 MG tablet Take 400 mg by mouth at bedtime. Reported on 09/26/2015 09/26/2015: Needs psychiatrist to review for refill, per primary care doctor  . rOPINIRole (REQUIP) 1 MG tablet TAKE 1 TABLET EVERY MORNING AND 2 TABLETS EVERY NIGHT PATIENT NEEDS APPOINTMENT (Patient not taking: Reported on 09/26/2015) 09/26/2015: Per primary care doctor patient needs to see a psychiatrist to manage this medication  . triamcinolone cream (KENALOG) 0.1 % Apply 1 application topically 2 (two) times daily.    No facility-administered encounter medications on file as of 09/26/2015.    Functional Status:  In your present state of health, do you have any difficulty performing the following activities: 08/08/2015 06/03/2015  Hearing? N N  Vision? N N  Difficulty concentrating or making decisions? Y N  Walking or climbing stairs? Y N  Dressing or bathing? N N  Doing errands, shopping? Y N  Preparing Food and eating ? Y -  Using the Toilet? N -  In the past six months, have you accidently leaked urine? Y -  Do you have problems with loss of bowel control? N -  Managing your Medications? Y -  Managing your Finances? Y -  Housekeeping or managing your Housekeeping? Y -    Fall/Depression Screening:  PHQ 2/9 Scores 08/03/2015 06/03/2015 04/04/2015  PHQ - 2 Score 2 0 -  PHQ- 9 Score 11 - -  Exception Documentation - - Medical reason    Assessment:  Met with patient in her home.  Per patient, she is in need of a refill of the requip for restless legs and Seroquel for sleep.  Patient states that her primary care provider will not re-fill these prescriptions and has recommended that she follow up with a Psychiatrist.  Patient reports seeing 3 psychiatrist in the past  and they will not re-fill the Seroquel due to the risk of side effects and dependence.  Per patient, her doctor in  her home town prescribed this medication for her and she has been on it for approximately 8 years. Patient agreed to be set up with [facility] [place] for medication management and therapy.  Appointment scheduled with Dr. Casimiro Needle on 10/20/15 at 10:45am . Per patient, she has seen this psychiatrist in the past several years ago.  Patient had to pay $150.00 to hold the appointment.  Patient continues to have the skin rash on her legs, back and ears.  Her primary care doctor is aware.  Patient anxious, tearful, very concerned that the doctor will not understand her request.  Plan:   This Education officer, museum encouraged patient to keep her appointment with the psychiatrist and to set up an appointment with a therapist in that office as well.

## 2015-10-06 ENCOUNTER — Other Ambulatory Visit: Payer: Self-pay | Admitting: Family Medicine

## 2015-10-10 ENCOUNTER — Other Ambulatory Visit: Payer: Self-pay | Admitting: *Deleted

## 2015-10-10 NOTE — Patient Outreach (Signed)
[place] [facility]) Care Management  10/10/2015  DANISE RIDDLEY 01/20/1941 [id number]   Phone call to patient to discuss transportation arrangements to her initial appointment with her psychiatrist on 4/21/1/7 at 10:45am.  She has agreed for this social worker to meet her there.  HIPPA compliant voicemail message left for a return call.   Sheralyn Boatman [facility] Care Management [phone]

## 2015-10-12 DIAGNOSIS — R262 Difficulty in walking, not elsewhere classified: Secondary | ICD-10-CM | POA: Diagnosis not present

## 2015-10-12 DIAGNOSIS — M542 Cervicalgia: Secondary | ICD-10-CM | POA: Insufficient documentation

## 2015-10-12 DIAGNOSIS — R404 Transient alteration of awareness: Secondary | ICD-10-CM | POA: Diagnosis not present

## 2015-10-12 DIAGNOSIS — R21 Rash and other nonspecific skin eruption: Secondary | ICD-10-CM | POA: Insufficient documentation

## 2015-10-12 DIAGNOSIS — F39 Unspecified mood [affective] disorder: Secondary | ICD-10-CM | POA: Diagnosis not present

## 2015-10-12 DIAGNOSIS — M79602 Pain in left arm: Secondary | ICD-10-CM | POA: Diagnosis not present

## 2015-10-12 DIAGNOSIS — R413 Other amnesia: Secondary | ICD-10-CM | POA: Diagnosis not present

## 2015-10-12 DIAGNOSIS — R29898 Other symptoms and signs involving the musculoskeletal system: Secondary | ICD-10-CM | POA: Insufficient documentation

## 2015-10-12 DIAGNOSIS — M79601 Pain in right arm: Secondary | ICD-10-CM | POA: Diagnosis not present

## 2015-10-12 DIAGNOSIS — G479 Sleep disorder, unspecified: Secondary | ICD-10-CM | POA: Diagnosis not present

## 2015-10-12 DIAGNOSIS — M5090 Cervical disc disorder, unspecified, unspecified cervical region: Secondary | ICD-10-CM | POA: Diagnosis not present

## 2015-10-14 ENCOUNTER — Other Ambulatory Visit: Payer: Self-pay | Admitting: *Deleted

## 2015-10-14 NOTE — Patient Outreach (Signed)
[facility]) Care Management  10/14/2015  Paula Frank January 09, 1941 [id number]   This social worker met patient at [place] and [place]  to see psychiatrist, however arrived on the wrong date.  Patient's appointment is actually scheduled for 10/20/15 at 10:45am.  Patient visably upset, tearful  and frustrated  about not being able to obtain her Seroquel. "I need my medicine"  "These doctor's don't understand" [name] equally upset and requested that this social worker contact her primary care doctor to request at least one weeks supply of Seroquel This social worker emphasized the fact of having no influence over medication prescribed and reminded her  that her primary care doctor has requested that her psychotropic medication be managed by a psychiatrist.  This social worker suggested that patient may need to go to the emergency room to be evaluated if needed.    Plan: This Education officer, museum will meet patient at the psychiatrist office on 10/20/15 at 10:45am.       Sheralyn Boatman [facility] Care Management [phone]

## 2015-10-18 DIAGNOSIS — Z7689 Persons encountering health services in other specified circumstances: Secondary | ICD-10-CM | POA: Diagnosis not present

## 2015-10-18 DIAGNOSIS — R262 Difficulty in walking, not elsewhere classified: Secondary | ICD-10-CM | POA: Diagnosis not present

## 2015-10-18 DIAGNOSIS — E538 Deficiency of other specified B group vitamins: Secondary | ICD-10-CM | POA: Diagnosis not present

## 2015-10-18 DIAGNOSIS — F39 Unspecified mood [affective] disorder: Secondary | ICD-10-CM | POA: Diagnosis not present

## 2015-10-18 DIAGNOSIS — R413 Other amnesia: Secondary | ICD-10-CM | POA: Diagnosis not present

## 2015-10-19 ENCOUNTER — Other Ambulatory Visit: Payer: Self-pay | Admitting: *Deleted

## 2015-10-19 NOTE — Patient Outreach (Signed)
[facility]) Care Management  10/19/2015  Paula Frank 06/12/1941 [id number]   Return phone call to patient after receiving a voicemail from patient that she has not been able to sleep and she will possibly need help finding a new place to live.  Patient was asleep when this social worker returned the call. Message left with her boyfriend to return the call.   Sheralyn Boatman [facility] Care Management [phone]

## 2015-10-20 ENCOUNTER — Other Ambulatory Visit: Payer: Self-pay | Admitting: *Deleted

## 2015-10-20 DIAGNOSIS — F3342 Major depressive disorder, recurrent, in full remission: Secondary | ICD-10-CM | POA: Diagnosis not present

## 2015-10-20 NOTE — Patient Outreach (Addendum)
[place] [facility]) Care Management  [facility] Social Work  10/20/2015  WILLIAM LASKE 23-Jul-1940 [id number]  Subjective: Patient is a 75 year old female  Objective:   Encounter Medications:  Outpatient Encounter Prescriptions as of 10/20/2015  Medication Sig Note  . Armodafinil (NUVIGIL) 250 MG tablet Take 250 mg by mouth daily. Reported on 08/08/2015 09/05/2015: rx refilled   . buPROPion (WELLBUTRIN SR) 100 MG 12 hr tablet Take 100 mg by mouth daily.   . clobetasol ointment (TEMOVATE) 6.76 % Apply 1 application topically 2 (two) times daily. Apply to arms and shins.   . diphenhydrAMINE (BENADRYL) 25 mg capsule Take 25 mg by mouth 2 (two) times daily as needed. Reported on 09/05/2015   . divalproex (DEPAKOTE ER) 250 MG 24 hr tablet Take 1 tablet by mouth daily. Reported on 09/05/2015 07/19/2015: Pt states Dr. Melrose Nakayama d/c  . doxepin (SINEQUAN) 25 MG capsule Take 1 capsule (25 mg total) by mouth at bedtime.   Marland Kitchen doxycycline (VIBRA-TABS) 100 MG tablet Take 1 tablet (100 mg total) by mouth 2 (two) times daily. (Patient not taking: Reported on 09/05/2015)   . halobetasol (ULTRAVATE) 0.05 % ointment Apply 1 application topically 2 (two) times daily.   Marland Kitchen levETIRAcetam (KEPPRA) 500 MG tablet Take 500 mg by mouth daily.   . montelukast (SINGULAIR) 10 MG tablet Take 1 tablet (10 mg total) by mouth daily. 08/08/2015: Pt did not renew, did not feel needed it, MD aware   . naproxen (NAPROSYN) 500 MG tablet Take 1 tablet (500 mg total) by mouth 2 (two) times daily with a meal. (Patient not taking: Reported on 08/08/2015)   . oxymetazoline (AFRIN) 0.05 % nasal spray Place 1 spray into both nostrils 2 (two) times daily.   . predniSONE (DELTASONE) 10 MG tablet Take 3 tabs daily x 2 days, then 2 tabs daily for 2 days, then 1 tab daily for 2 days (3, 3, 2, 2, 1, 1.) (Patient not taking: Reported on 08/08/2015) 08/08/2015: Completed   . QUEtiapine (SEROQUEL) 400 MG tablet Take 400 mg by mouth at bedtime.  Reported on 09/26/2015 09/26/2015: Needs psychiatrist to review for refill, per primary care doctor  . rOPINIRole (REQUIP) 1 MG tablet TAKE 1 TABLET EVERY MORNING AND 2 TABLETS EVERY NIGHT PATIENT NEEDS APPOINTMENT (Patient not taking: Reported on 09/26/2015) 09/26/2015: Per primary care doctor patient needs to see a psychiatrist to manage this medication  . triamcinolone cream (KENALOG) 0.1 % Apply 1 application topically 2 (two) times daily.    No facility-administered encounter medications on file as of 10/20/2015.    Functional Status:  In your present state of health, do you have any difficulty performing the following activities: 08/08/2015 06/03/2015  Hearing? N N  Vision? N N  Difficulty concentrating or making decisions? Y N  Walking or climbing stairs? Y N  Dressing or bathing? N N  Doing errands, shopping? Y N  Preparing Food and eating ? Y -  Using the Toilet? N -  In the past six months, have you accidently leaked urine? Y -  Do you have problems with loss of bowel control? N -  Managing your Medications? Y -  Managing your Finances? Y -  Housekeeping or managing your Housekeeping? Y -    Fall/Depression Screening:  PHQ 2/9 Scores 08/03/2015 06/03/2015 04/04/2015  PHQ - 2 Score 2 0 -  PHQ- 9 Score 11 - -  Exception Documentation - - Medical reason    Assessment:  This Education officer, museum met  patient at her appointment with psychiatrist, Dr. Casimiro Needle   Per patient, he would not prescribe her any medications for her anxiety due to her drinking.  Per patient, she was under the influence during the appointment and admitted this to the psychiatrist.  Follow up appointments schedule with therapist Celesta Gentile on 11/02/15 at 12 pm and Dr. Casimiro Needle 11/24/15 at 11:15am .  Patient refuses to go to treatment or Alcohol Anonymous but does agree to return for treatment.  Patient also willing to consider an Assisted Living.  [name] will be moving back to [place] tomorrow and she does not think she  could remain in the home with her boyfriend any longer.  Plan: This Education officer, museum will provide patient with assisted living options.

## 2015-10-26 ENCOUNTER — Other Ambulatory Visit: Payer: Self-pay | Admitting: *Deleted

## 2015-10-26 ENCOUNTER — Other Ambulatory Visit: Payer: Self-pay | Admitting: Family Medicine

## 2015-10-26 NOTE — Patient Outreach (Addendum)
[place] [facility]) Care Management  10/26/2015  ROZELYN MACHNIK Oct 24, 1940 [id number]   Phone call from patient and boyfriend stating that her son Christia Reading has moved out and her boyfriend is unable to assist patient with care needed at this time.  Patient agreeable to an Assisted Living and is willing to move forward with placement plans.   Plan:  This social worker will request an FL2 from patient's primary care doctor to send to [place] facilities for availability.    Sheralyn Boatman [facility] Care Management [phone]

## 2015-10-27 ENCOUNTER — Other Ambulatory Visit: Payer: Self-pay | Admitting: *Deleted

## 2015-10-27 NOTE — Patient Outreach (Addendum)
[place] [facility]) Care Management  10/27/2015  Paula Frank 20-Jun-1941 [id number]   Follow up phone call to patient to follow up on call to [facility].  Patient refusing to allow Mobile Crisis to come and assess her, however is willing to allow them tomorrow.    This Education officer, museum contacted mobile crisis, spoke with Darron  who called patient to try and get her to agree to an assessment and she declined.  Her recommended that if safety is a continued concern that I contact the police department for a wellness check.  [employer] Department contacted for a wellness check.[phone]  Plan:  This Education officer, museum will continue to follow up.   Sheralyn Boatman [facility] Care Management [phone]    .

## 2015-10-27 NOTE — Patient Outreach (Signed)
[facility] [facility]) Care Management  10/27/2015  MAKALI NORDINE [birth year] [id number]   Phone call to patient's provider requesting completion of an FL2.  FL2 faxed to Dr. Luan Pulling for completion to assist with patient with requested assisted living placement.   Sheralyn Boatman [facility] Care Management [phone]

## 2015-10-27 NOTE — Patient Outreach (Signed)
[place] [facility]) Care Management  10/27/2015  SHEINA BATE November 23, 1940 [id number]   Phone call from patient's roommate stating that patient is decompensating and he is concerned for her safety.  Per roommate patient is crying uncontrollably and fears being alone. EMS had to be called X2 in the past week because she had fallen and he was unable to get her up from the floor.  Per patient's roommate, he is unable to handle her emotional needs.  This Education officer, museum provided patient's roommate with the 24 hours/7 days a week Mobile Crisis number [phone] for an evaluation.   Plan:  This Education officer, museum will follow up with patient and roommate regarding recommendation to contact Mobile Crisis.   Sheralyn Boatman [facility] Care Management [phone]

## 2015-10-28 ENCOUNTER — Emergency Department
Admission: EM | Admit: 2015-10-28 | Discharge: 2015-10-31 | Disposition: A | Discharge status: Home / Self Care | Payer: Medicare Other | Attending: Emergency Medicine | Admitting: Emergency Medicine

## 2015-10-28 ENCOUNTER — Emergency Department: Payer: Medicare Other

## 2015-10-28 ENCOUNTER — Other Ambulatory Visit: Payer: Self-pay | Admitting: *Deleted

## 2015-10-28 DIAGNOSIS — Z8669 Personal history of other diseases of the nervous system and sense organs: Secondary | ICD-10-CM | POA: Insufficient documentation

## 2015-10-28 DIAGNOSIS — R410 Disorientation, unspecified: Secondary | ICD-10-CM | POA: Diagnosis not present

## 2015-10-28 DIAGNOSIS — R531 Weakness: Secondary | ICD-10-CM | POA: Diagnosis not present

## 2015-10-28 DIAGNOSIS — N189 Chronic kidney disease, unspecified: Secondary | ICD-10-CM | POA: Insufficient documentation

## 2015-10-28 DIAGNOSIS — F419 Anxiety disorder, unspecified: Secondary | ICD-10-CM | POA: Diagnosis not present

## 2015-10-28 DIAGNOSIS — F314 Bipolar disorder, current episode depressed, severe, without psychotic features: Secondary | ICD-10-CM | POA: Diagnosis not present

## 2015-10-28 DIAGNOSIS — Z79899 Other long term (current) drug therapy: Secondary | ICD-10-CM | POA: Diagnosis not present

## 2015-10-28 DIAGNOSIS — F0390 Unspecified dementia without behavioral disturbance: Secondary | ICD-10-CM | POA: Diagnosis not present

## 2015-10-28 DIAGNOSIS — R51 Headache: Secondary | ICD-10-CM | POA: Diagnosis not present

## 2015-10-28 DIAGNOSIS — F17210 Nicotine dependence, cigarettes, uncomplicated: Secondary | ICD-10-CM | POA: Insufficient documentation

## 2015-10-28 LAB — COMPREHENSIVE METABOLIC PANEL
ALT: 13 U/L — ABNORMAL LOW (ref 14–54)
AST: 18 U/L (ref 15–41)
Albumin: 3.4 g/dL — ABNORMAL LOW (ref 3.5–5.0)
Alkaline Phosphatase: 103 U/L (ref 38–126)
Anion gap: 12 (ref 5–15)
BUN: 17 mg/dL (ref 6–20)
CHLORIDE: 102 mmol/L (ref 101–111)
CO2: 21 mmol/L — ABNORMAL LOW (ref 22–32)
Calcium: 9.1 mg/dL (ref 8.9–10.3)
Creatinine, Ser: 0.85 mg/dL (ref 0.44–1.00)
GFR calc Af Amer: >60 mL/min (ref >60)
Glucose, Bld: 87 mg/dL (ref 65–99)
POTASSIUM: 4.1 mmol/L (ref 3.5–5.1)
Sodium: 135 mmol/L (ref 135–145)
Total Bilirubin: 0.3 mg/dL (ref 0.3–1.2)
Total Protein: 7.1 g/dL (ref 6.5–8.1)

## 2015-10-28 LAB — CBC WITH DIFFERENTIAL/PLATELET
BASOS ABS: 0 10*3/uL (ref 0–0.1)
BASOS PCT: 1 %
EOS PCT: 2 %
Eosinophils Absolute: 0.1 10*3/uL (ref 0–0.7)
HCT: 37.8 % (ref 35.0–47.0)
Hemoglobin: 12.5 g/dL (ref 12.0–16.0)
LYMPHS PCT: 26 %
Lymphs Abs: 1 10*3/uL (ref 1.0–3.6)
MCH: 32.8 pg (ref 26.0–34.0)
MCHC: 33.1 g/dL (ref 32.0–36.0)
MCV: 99 fL (ref 80.0–100.0)
MONO ABS: 0.4 10*3/uL (ref 0.2–0.9)
Monocytes Relative: 12 %
Neutro Abs: 2.3 10*3/uL (ref 1.4–6.5)
Neutrophils Relative %: 59 %
PLATELETS: 533 10*3/uL — AB (ref 150–440)
RBC: 3.81 MIL/uL (ref 3.80–5.20)
RDW: 14.3 % (ref 11.5–14.5)
WBC: 3.9 10*3/uL (ref 3.6–11.0)

## 2015-10-28 LAB — TROPONIN I

## 2015-10-28 MED ORDER — LORAZEPAM 2 MG/ML IJ SOLN
1.0000 mg | Freq: Once | INTRAMUSCULAR | Status: AC
  Administered 2015-10-28: 1 mg via INTRAVENOUS
  Filled 2015-10-28: qty 1

## 2015-10-28 MED ORDER — DEXTROSE 5 % IV SOLN
1.0000 g | Freq: Once | INTRAVENOUS | Status: AC
  Administered 2015-10-28: 1 g via INTRAVENOUS
  Filled 2015-10-28: qty 10

## 2015-10-28 MED ORDER — SODIUM CHLORIDE 0.9 % IV SOLN
Freq: Once | INTRAVENOUS | Status: AC
  Administered 2015-10-28: 16:00:00 via INTRAVENOUS

## 2015-10-28 NOTE — Patient Outreach (Addendum)
[facility]) Care Management  10/28/2015  Paula Frank [birth year] [id number]   Phone call from [facility], mobile crisis, Jerold Coombe who stated that patient denies suicide or homicide ideation, has no intent of taking her life but stated that she did not want to be useless.  Vinnie Level stated that EMS was called due to right sided weakness and patient is on her way to [facility]. This social worker placed a call to the emergency room social worker at [employer] , spoke with Claudine to suggest a psychiatric evaluation.  Per Claudine, patient will be assessed upon her arrival and need for a psychiatric evaluation will be determined at that time. This Education officer, museum will continue to follow as needed.    Sheralyn Boatman [facility] Care Management [phone]

## 2015-10-28 NOTE — ED Notes (Signed)
Pt aware of need for urine sample.  

## 2015-10-28 NOTE — Patient Outreach (Signed)
[place] [facility]) Care Management  10/28/2015  JULEIGH KAPLA 11/14/1940 [id number]   Phone call from patient's roommate Nicole Kindred, stating that the Wellness check by the [facility] office was completed yesterday. Mobile Crisis contacted again today, and they have agreed to come to patient's  home to assess her.  Per Nicole Kindred, they should arrive within 2 hours.  This Education officer, museum notified patient's provider's office of anticipated Mobile Crisis visit.  Patient crying uncontrollably stating "I am scared, I don't know what to do"  "I need help". Patient re-assured, encouraged open communication with the Mobile Crisis Unit in order to get assessed properly.    Plan: This Education officer, museum will continue to follow.      Sheralyn Boatman [facility] Care Management [phone]

## 2015-10-28 NOTE — ED Notes (Signed)
Pt came to ED via EMS c/o headache, chest pain, and multiple other complaints. Per EMS, pt recently diagnosed with new onset dementia and has been having trouble with her short term memory and having anxiety related to that. Pt alert and oriented x3. VS stable.

## 2015-10-28 NOTE — ED Notes (Signed)
Pt aware of need for urine sample - she states she only goes once or twice a day and does not have to go at this time - pt made aware that we might have to in and out cath if she could not void - pt fed a sandwich tray

## 2015-10-28 NOTE — ED Notes (Signed)
Transferred to rm 20 for psych hold
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
## 2015-10-28 NOTE — ED Provider Notes (Signed)
[facility] Emergency Department Provider Note        Time seen: ----------------------------------------- 3:23 PM on 10/28/2015 -----------------------------------------    I have reviewed the triage vital signs and the nursing notes. L5 caveat: Review of systems and history is limited by dementia  HISTORY  Chief Complaint Anxiety    HPI Paula Frank is a 75 y.o. female who presents to ER for multiple complaints. Patient arrives via EMS for headache, chest pain and multiple other pain-related complaints. According to EMS she was recently diagnosed with dementia and she is having trouble with her short-term memory and is feeling very anxious related to that. On arrival she is requesting the lights be turned off.   Past Medical History  Diagnosis Date  . Lupus (systemic lupus erythematosus) ([place])   . Discoid lupus   . Gastritis   . Chronic pain   . Restless leg   . Anxiety   . Diverticulitis   . Chronic kidney disease   . Seizures [facility])     Patient Active Problem List   Diagnosis Date Noted  . Pruritus 08/31/2015  . Bronchitis 06/03/2015  . Clinical depression 04/04/2015  . Hive 04/04/2015  . 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 Emesis 04/04/2015  . Bipolar disorder, current episode hypomanic ([place]) 02/16/2015  . Alcohol abuse 02/16/2015  . Alzheimer's dementia 02/16/2015  . DDD (degenerative disc disease), lumbosacral 10/31/2014  . 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 DDD (degenerative disc disease), cervical 10/25/2014  . DDD (degenerative disc disease), lumbar 10/25/2014  . Complete rotator cuff rupture of left shoulder 08/09/2014  . Pain in shoulder 07/14/2014  . Current tobacco use 02/19/2014  . Systemic lupus erythematosus ([place]) 01/14/2014  . Amnesia 12/01/2013  . Loss of feeling or sensation 10/30/2013  . Seizure ([place]) 10/30/2013  . Disordered sleep 10/30/2013  . Adynamia  10/30/2013  . Blood pressure elevated 05/19/2012  . Fatigue 11/20/2011    Past Surgical History  Procedure Laterality Date  . Tubal ligation    . Abdominal hysterectomy    . Laparoscopy    . Rotator cuff repair    . Lithotripsy    . Knee arthroscopy w/ meniscectomy    . Appendectomy    . Rotator cuff repair Bilateral     x2    Allergies Amoxicillin; Ioxaglate; Ivp dye; Levaquin; Levofloxacin; Moxifloxacin hcl in nacl; Erythromycin; and Sulfa antibiotics  Social History Social History  Substance Use Topics  . Smoking status: Current Every Day Smoker -- 1.00 packs/day for 60 years    Types: Cigarettes  . Smokeless tobacco: Never Used  . Alcohol Use: 4.2 - 8.4 oz/week    7-14 Standard drinks or equivalent per week     Comment: 2 glasses of wine a day    Review of Systems Unclear at this time, positive for headache, chest pain, shoulder pain, anxiety  ____________________________________________   PHYSICAL EXAM:  VITAL SIGNS: ED Triage Vitals  Enc Vitals Group     BP 10/28/15 1500 136/83 mmHg     Pulse Rate 10/28/15 1500 99     Resp 10/28/15 1500 16     Temp 10/28/15 1500 98.1 F (36.7 C)     Temp Source 10/28/15 1500 Oral     SpO2 10/28/15 1500 99 %     Weight 10/28/15 1500 120 lb (54.432 kg)     Height 10/28/15 1500 5\' 6"  (1.676 m)     Head Cir --  Peak Flow --      Pain Score --      Pain Loc --      Pain Edu? --      Excl. in [place]? --     Constitutional: Alert But disoriented, mild distress, appears anxious Eyes: Conjunctivae are normal. PERRL. Normal extraocular movements. ENT   Head: Normocephalic and atraumatic.   Nose: No congestion/rhinnorhea.   Mouth/Throat: Mucous membranes are moist.   Neck: No stridor. Cardiovascular: Normal rate, regular rhythm. No murmurs, rubs, or gallops. Respiratory: Normal respiratory effort without tachypnea nor retractions. Breath sounds are clear and equal bilaterally. No  wheezes/rales/rhonchi. Gastrointestinal: Soft and nontender. Normal bowel sounds Musculoskeletal: Nontender with normal range of motion in all extremities. No lower extremity tenderness nor edema. Neurologic:   No gross focal neurologic deficits are appreciated.  Skin:  Skin is warm, dry and intact. No rash noted. Psychiatric: Anxious mood and affect. ____________________________________________  ED COURSE:  Pertinent labs & imaging results that were available during my care of the patient were reviewed by me and considered in my medical decision making (see chart for details). Patient presents anxious with chronic confusion. I will check basic labs, give Ativan and reevaluate. ____________________________________________    LABS (pertinent positives/negatives)  Labs Reviewed  CBC WITH DIFFERENTIAL/PLATELET - Abnormal; Notable for the following:    Platelets 533 (*)    All other components within normal limits  COMPREHENSIVE METABOLIC PANEL - Abnormal; Notable for the following:    CO2 21 (*)    Albumin 3.4 (*)    ALT 13 (*)    All other components within normal limits  TROPONIN I  URINALYSIS COMPLETEWITH MICROSCOPIC ([facility] ONLY)    RADIOLOGY Images were viewed by me  CT head IMPRESSION: No acute intracranial process.  Chronic microvascular ischemic changes. ____________________________________________  FINAL ASSESSMENT AND PLAN  Anxiety, dementia  Plan: Patient with labs and imaging as dictated above. Patient appears medically stable for psychiatric evaluation and disposition. Mobile crisis has been extensively involved in trying to manage this patient and they are requesting a psychiatric evaluation. She currently does have extreme anxiety and likely dementia. She remains medically stable at this time.   Earleen Newport, MD   Note: This dictation was prepared with Dragon dictation. Any transcriptional errors that result from this process are  unintentional   Earleen Newport, MD 10/28/15 2314

## 2015-10-28 NOTE — ED Notes (Signed)
Pt is very forgetful and requires constant reminders of what she is here for and prompts for what has been done and completed regarding her care

## 2015-10-29 DIAGNOSIS — F419 Anxiety disorder, unspecified: Secondary | ICD-10-CM | POA: Diagnosis not present

## 2015-10-29 DIAGNOSIS — F314 Bipolar disorder, current episode depressed, severe, without psychotic features: Secondary | ICD-10-CM

## 2015-10-29 LAB — URINALYSIS COMPLETE WITH MICROSCOPIC (ARMC ONLY)
Bilirubin Urine: NEGATIVE
Glucose, UA: NEGATIVE mg/dL
HGB URINE DIPSTICK: NEGATIVE
LEUKOCYTES UA: NEGATIVE
Nitrite: NEGATIVE
PH: 5 (ref 5.0–8.0)
PROTEIN: NEGATIVE mg/dL
SPECIFIC GRAVITY, URINE: 1.015 (ref 1.005–1.030)

## 2015-10-29 MED ORDER — NICOTINE 21 MG/24HR TD PT24
21.0000 mg | MEDICATED_PATCH | Freq: Once | TRANSDERMAL | Status: AC
  Administered 2015-10-29: 21 mg via TRANSDERMAL
  Filled 2015-10-29: qty 1

## 2015-10-29 MED ORDER — LORAZEPAM 1 MG PO TABS
ORAL_TABLET | ORAL | Status: AC
  Administered 2015-10-29: 1 mg
  Filled 2015-10-29: qty 1

## 2015-10-29 MED ORDER — QUETIAPINE FUMARATE 200 MG PO TABS
400.0000 mg | ORAL_TABLET | Freq: Every day | ORAL | Status: DC
  Administered 2015-10-29: 400 mg via ORAL
  Filled 2015-10-29: qty 2

## 2015-10-29 MED ORDER — QUETIAPINE FUMARATE 25 MG PO TABS
100.0000 mg | ORAL_TABLET | Freq: Every day | ORAL | Status: DC
  Administered 2015-10-29 – 2015-10-30 (×2): 100 mg via ORAL
  Filled 2015-10-29 (×2): qty 4

## 2015-10-29 MED ORDER — ROPINIROLE HCL 1 MG PO TABS
2.0000 mg | ORAL_TABLET | Freq: Every day | ORAL | Status: DC
  Administered 2015-10-29 – 2015-10-30 (×3): 2 mg via ORAL
  Filled 2015-10-29 (×5): qty 2

## 2015-10-29 MED ORDER — DOXEPIN HCL 25 MG PO CAPS
25.0000 mg | ORAL_CAPSULE | Freq: Every day | ORAL | Status: DC
  Administered 2015-10-29 – 2015-10-30 (×2): 25 mg via ORAL
  Filled 2015-10-29 (×3): qty 1

## 2015-10-29 MED ORDER — ROPINIROLE HCL 1 MG PO TABS
1.0000 mg | ORAL_TABLET | Freq: Every morning | ORAL | Status: DC
  Administered 2015-10-29 – 2015-10-31 (×3): 1 mg via ORAL
  Filled 2015-10-29 (×5): qty 1

## 2015-10-29 MED ORDER — LEVETIRACETAM 500 MG PO TABS
500.0000 mg | ORAL_TABLET | Freq: Two times a day (BID) | ORAL | Status: DC
  Administered 2015-10-29 – 2015-10-31 (×4): 500 mg via ORAL
  Filled 2015-10-29 (×4): qty 1

## 2015-10-29 MED ORDER — BUPROPION HCL ER (XL) 150 MG PO TB24
150.0000 mg | ORAL_TABLET | Freq: Every day | ORAL | Status: DC
  Administered 2015-10-29 – 2015-10-31 (×3): 150 mg via ORAL
  Filled 2015-10-29 (×3): qty 1

## 2015-10-29 MED ORDER — LORAZEPAM 1 MG PO TABS
1.0000 mg | ORAL_TABLET | Freq: Once | ORAL | Status: AC
  Administered 2015-10-29: 1 mg via ORAL

## 2015-10-29 MED ORDER — LORAZEPAM 1 MG PO TABS
ORAL_TABLET | ORAL | Status: AC
  Administered 2015-10-29: 1 mg via ORAL
  Filled 2015-10-29: qty 1

## 2015-10-29 MED ORDER — DIVALPROEX SODIUM 250 MG PO DR TAB
250.0000 mg | DELAYED_RELEASE_TABLET | Freq: Three times a day (TID) | ORAL | Status: DC
  Administered 2015-10-29 – 2015-10-31 (×6): 250 mg via ORAL
  Filled 2015-10-29 (×6): qty 1

## 2015-10-29 MED ORDER — ACETAMINOPHEN 325 MG PO TABS
ORAL_TABLET | ORAL | Status: AC
  Administered 2015-10-29: 650 mg
  Filled 2015-10-29: qty 2

## 2015-10-29 MED ORDER — LORAZEPAM 1 MG PO TABS
1.0000 mg | ORAL_TABLET | Freq: Once | ORAL | Status: AC
  Administered 2015-10-29: 1 mg via ORAL
  Filled 2015-10-29: qty 1

## 2015-10-29 MED ORDER — ACETAMINOPHEN 500 MG PO TABS: 1000.0000 mg | ORAL_TABLET | Freq: Once | ORAL | Status: DC

## 2015-10-29 MED ORDER — MODAFINIL 200 MG PO TABS
200.0000 mg | ORAL_TABLET | Freq: Every day | ORAL | Status: DC
  Administered 2015-10-30: 200 mg via ORAL
  Filled 2015-10-29: qty 1

## 2015-10-29 NOTE — ED Provider Notes (Signed)
Filed Vitals:   10/29/15 0702 10/29/15 1410  BP: 111/74 149/79  Pulse: 96 104  Temp: 98.2 F (36.8 C) 98.2 F (36.8 C)  Resp: 16 18   Labs Reviewed  CBC WITH DIFFERENTIAL/PLATELET - Abnormal; Notable for the following:    Platelets 533 (*)    All other components within normal limits  COMPREHENSIVE METABOLIC PANEL - Abnormal; Notable for the following:    CO2 21 (*)    Albumin 3.4 (*)    ALT 13 (*)    All other components within normal limits  URINALYSIS COMPLETEWITH MICROSCOPIC ([facility] ONLY) - Abnormal; Notable for the following:    Color, Urine YELLOW (*)    APPearance CLEAR (*)    Ketones, ur TRACE (*)    Bacteria, UA RARE (*)    Squamous Epithelial / LPF 0-5 (*)    All other components within normal limits  TROPONIN I   Patient remains medically stable for psychiatric disposition   Earleen Newport, MD 10/29/15 2037

## 2015-10-29 NOTE — ED Notes (Signed)

## 2015-10-29 NOTE — Clinical Social Work Note (Signed)
Clinical Social Work Assessment  Patient Details  Name: Paula Frank MRN: [id number] Date of Birth: 02/02/1941  Date of referral:  10/29/15               Reason for consult:  [employer], Medical sales representative sought to share information with:  Family Supports, Chartered certified accountant granted to share information::  Yes, Verbal Permission Granted  Name::     [name] her boyfriend [phone]  Agency::  yes  Relationship::  yes  Contact Information:  yes  Housing/Transportation Living arrangements for the past 2 months:  Apartment Source of Information:  Patient Patient Interpreter Needed:  None Criminal Activity/Legal Involvement Pertinent to Current Situation/Hospitalization:  No - Comment as needed Significant Relationships:  Adult Children, Friend Lives with:  Self Do you feel safe going back to the place where you live?  No Need for family participation in patient care:  Yes (Comment)  Care giving concerns: According to patient community SW Chrystal the patient seems to have symptoms of early stages of dementia. She apparently was very disorganized in her thoughts unable to remember her home phone # but could remember her SSN.   Social Worker assessment / plan:  LCSW met with patient she requested the lights be turned down and to tell her nurse her head is killing her. CT scan ordered yesterday with no significant finding accorrding to ED Nurse. Patient is independent with her ADLs and lives in her own apartment with community support. She reports she has a SW in the [employer] but cant remember who she is with. Patient gave verbal consent to call her boyfriend Nicole Kindred and her son. ( unable to reach either). Patient has medicare a/b Proofreader.  Employment status:  Retired Forensic scientist:  Engineer, mining) PT Recommendations:  Not assessed at this time Information / Referral to  community resources:  [place]  Patient/Family's Response to care:  Unable to reach  Patient/Family's Understanding of and Emotional Response to Diagnosis, Current Treatment, and Prognosis: Unable to reach  Emotional Assessment Appearance:  Appears stated age Attitude/Demeanor/Rapport:  Self-Absorbed, Complaining Affect (typically observed):  Agitated, Irritable Orientation:  Oriented to Self, Oriented to Place, Fluctuating Orientation (Suspected and/or reported Sundowners) Alcohol / Substance use:  Tobacco Use Psych involvement (Current and /or in the community):  Yes (Comment) Architect)  Discharge Needs  Concerns to be addressed:  Coping/Stress Concerns, Cognitive Concerns Readmission within the last 30 days:  No Current discharge risk:  Cognitively Impaired Barriers to Discharge:  Continued Medical Work up   Joana Reamer, LCSW 10/29/2015, 9:41 AM

## 2015-10-29 NOTE — Progress Notes (Signed)
LCSW consulted with Dr Bary Leriche and feels the patient would be better off with Geri-psychiatry facility. TTS consulted.   [employer] LCSW [phone]

## 2015-10-29 NOTE — ED Notes (Signed)
Called to change diet to soft foods per patient request, she does not have her dentures.

## 2015-10-29 NOTE — ED Notes (Signed)
Patient becoming vocal about family and getting worked up, asking MD for PO Ativan.  Her last dose was this am.

## 2015-10-29 NOTE — ED Notes (Signed)
Patient belongings sent to [facility]:  One pair of white shoes, One white sweatshirt, One white pair of shorts with nothing in pockets.

## 2015-10-29 NOTE — ED Notes (Signed)
Per patient request, diet changed to pureed or soft foods to accommodate her since she does not have her teeth.  Ordered her some potato soup and gravy to go on her mashed potatoes from dinner tray.  She was unable to eat the grilled cheese.

## 2015-10-29 NOTE — NC FL2 (Signed)
[place] LEVEL OF CARE SCREENING TOOL     IDENTIFICATION  Patient Name: Paula Frank Birthdate: 07-21-1940 Sex: female Admission Date (Current Location): 10/28/2015  [facility] and [place] Number:  Engineering geologist and Address:  [facility], [address], [place], [place] [address]      Provider Number: [id number]  Attending Physician Name and Address:  No att. providers found  Relative Name and Phone Number:       Current Level of Care: Hospital Recommended Level of Care: Memory Care Prior Approval Number:    Date Approved/Denied:   PASRR Number:    Discharge Plan: Home    Current Diagnoses: Patient Active Problem List   Diagnosis Date Noted  . Pruritus 08/31/2015  . Bronchitis 06/03/2015  . Clinical depression 04/04/2015  . Hive 04/04/2015  . 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 Emesis 04/04/2015  . Bipolar disorder, current episode hypomanic ([place]) 02/16/2015  . Alcohol abuse 02/16/2015  . Alzheimer's dementia 02/16/2015  . DDD (degenerative disc disease), lumbosacral 10/31/2014  . 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 DDD (degenerative disc disease), cervical 10/25/2014  . DDD (degenerative disc disease), lumbar 10/25/2014  . Complete rotator cuff rupture of left shoulder 08/09/2014  . Pain in shoulder 07/14/2014  . Current tobacco use 02/19/2014  . Systemic lupus erythematosus ([place]) 01/14/2014  . Amnesia 12/01/2013  . Loss of feeling or sensation 10/30/2013  . Seizure ([place]) 10/30/2013  . Disordered sleep 10/30/2013  . Adynamia 10/30/2013  . Blood pressure elevated 05/19/2012  . Fatigue 11/20/2011    Orientation RESPIRATION BLADDER Height & Weight     Self  Normal Continent Weight: 120 lb (54.432 kg) Height:  5\' 6"  (167.6 cm)  BEHAVIORAL SYMPTOMS/MOOD NEUROLOGICAL BOWEL NUTRITION STATUS      Continent    AMBULATORY STATUS COMMUNICATION OF NEEDS Skin    Independent Verbally Normal                       Personal Care Assistance Level of Assistance  Bathing, Feeding, Dressing Bathing Assistance: Independent Feeding assistance: Independent Dressing Assistance: Independent     Functional Limitations Info  Sight, Hearing, Speech Sight Info: Adequate Hearing Info: Adequate Speech Info: Adequate    SPECIAL CARE FACTORS FREQUENCY                       Contractures      Additional Factors Info  Allergies   Allergies Info: Amoxicillin, Ioxaglate, Ivp Dye, Levaquin, Levofloxacin, Moxifloxacin Hcl In Nacl, Erythromycin, Sulfa Antibiotics           Current Medications (10/29/2015):  This is the current hospital active medication list Current Facility-Administered Medications  Medication Dose Route Frequency Provider Last Rate Last Dose  . acetaminophen (TYLENOL) tablet 1,000 mg  1,000 mg Oral Once Joanne Gavel, MD      . QUEtiapine (SEROQUEL) tablet 400 mg  400 mg Oral QHS Nance Pear, MD   400 mg at 10/29/15 0050  . rOPINIRole (REQUIP) tablet 1 mg  1 mg Oral q morning - 10a Nance Pear, MD      . rOPINIRole (REQUIP) tablet 2 mg  2 mg Oral QHS Nance Pear, MD   2 mg at 10/29/15 0147   Current Outpatient Prescriptions  Medication Sig Dispense Refill  . Armodafinil (NUVIGIL) 250 MG tablet Take 250 mg by mouth daily. Reported on 08/08/2015    . buPROPion (  WELLBUTRIN SR) 100 MG 12 hr tablet Take 100 mg by mouth daily.    . clobetasol ointment (TEMOVATE) [id number] % Apply 1 application topically 2 (two) times daily. Apply to arms and shins.    . diphenhydrAMINE (BENADRYL) 25 mg capsule Take 25 mg by mouth 2 (two) times daily as needed. Reported on 09/05/2015    . divalproex (DEPAKOTE ER) 250 MG 24 hr tablet Take 1 tablet by mouth daily. Reported on 09/05/2015    . doxepin (SINEQUAN) 25 MG capsule Take 1 capsule (25 mg total) by mouth at bedtime. 30 capsule 6  . doxycycline (VIBRA-TABS) 100 MG tablet Take 1 tablet (100  mg total) by mouth 2 (two) times daily. (Patient not taking: Reported on 09/05/2015) 20 tablet 0  . halobetasol (ULTRAVATE) 0.05 % ointment Apply 1 application topically 2 (two) times daily.  5  . levETIRAcetam (KEPPRA) 500 MG tablet Take 500 mg by mouth daily.    . montelukast (SINGULAIR) 10 MG tablet Take 1 tablet (10 mg total) by mouth daily. 30 tablet 6  . naproxen (NAPROSYN) 500 MG tablet Take 1 tablet (500 mg total) by mouth 2 (two) times daily with a meal. (Patient not taking: Reported on 08/08/2015) 20 tablet 0  . ondansetron (ZOFRAN) 4 MG tablet TAKE 1 TABLET (4 MG TOTAL) BY MOUTH EVERY 8 (EIGHT) HOURS AS NEEDED FOR NAUSEA OR VOMITING. 30 tablet 3  . oxymetazoline (AFRIN) 0.05 % nasal spray Place 1 spray into both nostrils 2 (two) times daily.    . predniSONE (DELTASONE) 10 MG tablet Take 3 tabs daily x 2 days, then 2 tabs daily for 2 days, then 1 tab daily for 2 days (3, 3, 2, 2, 1, 1.) (Patient not taking: Reported on 08/08/2015) 12 tablet 0  . QUEtiapine (SEROQUEL) 400 MG tablet Take 400 mg by mouth at bedtime. Reported on 09/26/2015    . rOPINIRole (REQUIP) 1 MG tablet TAKE 1 TABLET EVERY MORNING AND 2 TABLETS EVERY NIGHT PATIENT NEEDS APPOINTMENT (Patient not taking: Reported on 09/26/2015) 90 tablet 0  . triamcinolone cream (KENALOG) 0.1 % Apply 1 application topically 2 (two) times daily. 60 g 2     Discharge Medications: Please see discharge summary for a list of discharge medications.  Relevant Imaging Results:  Relevant Lab Results:   Additional Information SSN [id number]  Joana Reamer, [place]
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
## 2015-10-29 NOTE — BHH Counselor (Signed)
Per Dr. Bary Leriche, pt meets inpatient criteria. Pt faxed to Geriatric Psych:   -[place]

## 2015-10-29 NOTE — ED Notes (Signed)
Report rcvd from [place], [place]

## 2015-10-30 DIAGNOSIS — F419 Anxiety disorder, unspecified: Secondary | ICD-10-CM | POA: Diagnosis not present

## 2015-10-30 LAB — VALPROIC ACID LEVEL: Valproic Acid Lvl: 30 ug/mL — ABNORMAL LOW (ref 50.0–100.0)

## 2015-10-30 LAB — AMMONIA: Ammonia: 19 umol/L (ref 9–35)

## 2015-10-30 MED ORDER — IBUPROFEN 600 MG PO TABS
600.0000 mg | ORAL_TABLET | Freq: Four times a day (QID) | ORAL | Status: DC | PRN
  Administered 2015-10-30: 600 mg via ORAL
  Filled 2015-10-30 (×2): qty 1

## 2015-10-30 MED ORDER — MODAFINIL 100 MG PO TABS
ORAL_TABLET | ORAL | Status: AC
  Administered 2015-10-30: 200 mg via ORAL
  Filled 2015-10-30: qty 2

## 2015-10-30 MED ORDER — NICOTINE 21 MG/24HR TD PT24
21.0000 mg | MEDICATED_PATCH | Freq: Once | TRANSDERMAL | Status: DC
  Administered 2015-10-30: 21 mg via TRANSDERMAL
  Filled 2015-10-30: qty 1

## 2015-10-30 MED ORDER — TRIAMCINOLONE ACETONIDE 0.1 % EX CREA
1.0000 "application " | TOPICAL_CREAM | Freq: Two times a day (BID) | CUTANEOUS | Status: DC
  Administered 2015-10-30 – 2015-10-31 (×2): 1 via TOPICAL
  Filled 2015-10-30: qty 15

## 2015-10-30 NOTE — ED Notes (Signed)

## 2015-10-30 NOTE — ED Notes (Signed)

## 2015-10-30 NOTE — ED Notes (Signed)
BEHAVIORAL HEALTH ROUNDING  Patient sleeping: No.  Patient alert and oriented: yes  Behavior appropriate: Yes. ; If no, describe:  Nutrition and fluids offered: Yes  Toileting and hygiene offered: Yes  Sitter present: not applicable, Q 15 min safety rounds and observation. Law enforcement present: Yes ODS  ENVIRONMENTAL ASSESSMENT  Potentially harmful objects out of patient reach: Yes.  Personal belongings secured: Yes.  Patient dressed in hospital provided attire only: Yes.  Plastic bags out of patient reach: Yes.  Patient care equipment (cords, cables, call bells, lines, and drains) shortened, removed, or accounted for: Yes.  Equipment and supplies removed from bottom of stretcher: Yes.  Potentially toxic materials out of patient reach: Yes.  Sharps container removed or out of patient reach: Yes.

## 2015-10-30 NOTE — BHH Counselor (Signed)
Call received from [place], with [facility], [phone] ext [phone]), reporting that they will need a copy of patient's IVC paperwork in order to accept patient for placement.  TTS counselor faxed a copy of the IVC per [facility] Luke's request.

## 2015-10-30 NOTE — ED Notes (Signed)
Pt given breakfast tray, sitting in chair eating

## 2015-10-30 NOTE — ED Notes (Signed)
Pt aggitated b/c i wont leave her steroid cream in the room with her. Crying. Given her evening meds

## 2015-10-30 NOTE — Consult Note (Signed)
  This is follow-up on the consult done yesterday. Paula Frank was referred to geropsychiatry unit and she is accepted by [facility]. She will be transferred tomorrow. I'll check her Depakote level that is subtherapeutic at 30 most likely due to poor compliance. Ammonia level is normal.

## 2015-10-30 NOTE — ED Notes (Signed)
Pt ate 100% of breakfast.

## 2015-10-30 NOTE — ED Notes (Signed)
BEHAVIORAL HEALTH ROUNDING  Patient sleeping: No.  Patient alert and oriented: yes  Behavior appropriate: Yes. ; If no, describe:  Nutrition and fluids offered: Yes  Toileting and hygiene offered: Yes  Sitter present: not applicable, Q 15 min safety rounds and observation.  Law enforcement present: Yes ODS  

## 2015-10-30 NOTE — ED Provider Notes (Signed)
-----------------------------------------   7:03 AM on 10/30/2015 -----------------------------------------   Blood pressure 127/65, pulse 93, temperature 98.1 F (36.7 C), temperature source Oral, resp. rate 18, height 5\' 6"  (1.676 m), weight 120 lb (54.432 kg), SpO2 94 %.  The patient had no acute events since last update.  Calm and cooperative at this time.    Patient meets criteria for inpatient psych admission. At this time she is awaiting acceptance to a geriatric psychiatry facility.     Loney Hering, MD 10/30/15 [phone]

## 2015-10-30 NOTE — ED Notes (Signed)
Pt upset that her nightly meds did not include ativan. Advised she was getting the medications prescribed by the doctor, and to let them take effect. Pt standing in door way - redirected back into the room and the tv turned off for night time.

## 2015-10-30 NOTE — ED Notes (Signed)
Pt resting in bed, eyes closed resp even and unlabored

## 2015-10-30 NOTE — BH Assessment (Addendum)
Writer re-faxed IVC paperwork to [facility].  Per RN at [facility] they will call in the morning when the bed is open to give the name of the accepting physician, bed assignment and the number for the nurses to call report.

## 2015-10-31 ENCOUNTER — Ambulatory Visit: Payer: Self-pay | Admitting: Family Medicine

## 2015-10-31 ENCOUNTER — Other Ambulatory Visit: Payer: Self-pay | Admitting: *Deleted

## 2015-10-31 DIAGNOSIS — F419 Anxiety disorder, unspecified: Secondary | ICD-10-CM | POA: Diagnosis not present

## 2015-10-31 DIAGNOSIS — F314 Bipolar disorder, current episode depressed, severe, without psychotic features: Secondary | ICD-10-CM | POA: Diagnosis not present

## 2015-10-31 MED ORDER — DIVALPROEX SODIUM 250 MG PO DR TAB: 250.0000 mg | DELAYED_RELEASE_TABLET | Freq: Three times a day (TID) | ORAL | Status: DC

## 2015-10-31 MED ORDER — LEVETIRACETAM 500 MG PO TABS: 500.0000 mg | ORAL_TABLET | Freq: Two times a day (BID) | ORAL | Status: DC

## 2015-10-31 MED ORDER — ALPRAZOLAM 0.5 MG PO TABS
ORAL_TABLET | ORAL | Status: AC
  Administered 2015-10-31: 0.25 mg via ORAL
  Filled 2015-10-31: qty 1

## 2015-10-31 MED ORDER — BUPROPION HCL ER (XL) 150 MG PO TB24: 150.0000 mg | ORAL_TABLET | Freq: Every day | ORAL | Status: DC

## 2015-10-31 MED ORDER — DIPHENHYDRAMINE HCL 25 MG PO CAPS
25.0000 mg | ORAL_CAPSULE | Freq: Once | ORAL | Status: AC
  Administered 2015-10-31: 25 mg via ORAL
  Filled 2015-10-31: qty 1

## 2015-10-31 MED ORDER — ALPRAZOLAM 0.5 MG PO TABS
0.2500 mg | ORAL_TABLET | Freq: Once | ORAL | Status: AC
  Administered 2015-10-31: 0.25 mg via ORAL

## 2015-10-31 MED ORDER — DOXEPIN HCL 25 MG PO CAPS: 25.0000 mg | ORAL_CAPSULE | Freq: Every day | ORAL | Status: DC

## 2015-10-31 NOTE — ED Notes (Signed)
BEHAVIORAL HEALTH ROUNDING Patient sleeping: No. Patient alert and oriented: yes Behavior appropriate: Yes.  ; If no, describe:  Nutrition and fluids offered: yes Toileting and hygiene offered: Yes  Sitter present: q15 minute observations and security monitoring Law enforcement present: Yes  ODS  She is sitting in a recliner in her room

## 2015-10-31 NOTE — ED Notes (Signed)
Decaf coffee provided - she is sitting on the side of the bed  NAD assessed

## 2015-10-31 NOTE — ED Notes (Signed)
BEHAVIORAL HEALTH ROUNDING Patient sleeping: No. Patient alert and oriented: yes Behavior appropriate: Yes.  ; If no, describe:  Nutrition and fluids offered: yes Toileting and hygiene offered: Yes  Sitter present: q15 minute observations and security  monitoring Law enforcement present: Yes  ODS  

## 2015-10-31 NOTE — ED Notes (Signed)
Am meds administered as ordered  - assessment completed  She denies pain  She continues to cry - sometimes uncontrollably   Antianxiety med to be administered

## 2015-10-31 NOTE — ED Notes (Signed)

## 2015-10-31 NOTE — ED Provider Notes (Signed)
-----------------------------------------   7:30 AM on 10/31/2015 -----------------------------------------   Blood pressure 163/88, pulse 74, temperature 98.6 F (37 C), temperature source Oral, resp. rate 18, height 5\' 6"  (1.676 m), weight 120 lb (54.432 kg), SpO2 98 %.  The patient had no acute events since last update.  Calm and cooperative at this time.   Patient to be accepted to [facility]. Awaiting call with bed availability and physician's name.     Loney Hering, MD 10/31/15 0730

## 2015-10-31 NOTE — ED Notes (Signed)
ED BHU [place] Is the patient under IVC or is there intent for IVC: Yes.   Is the patient medically cleared: Yes.   Is there vacancy in the ED BHU: Yes.   Is the population mix appropriate for patient: Yes.   Is the patient awaiting placement in inpatient or outpatient setting:  Pending inpt geriatric admission   Has the patient had a psychiatric consult: Yes.   Survey of unit performed for contraband, proper placement and condition of furniture, tampering with fixtures in bathroom, shower, and each patient room: Yes.  ; Findings:  APPEARANCE/BEHAVIOR Calm and cooperative NEURO ASSESSMENT Orientation: oriented to self , place and situation  Disoriented to time   Denies pain Hallucinations: No.None noted (Hallucinations) Speech: Normal Gait: normal RESPIRATORY ASSESSMENT Even  Unlabored respirations  CARDIOVASCULAR ASSESSMENT Pulses equal   regular rate  Skin warm and dry   GASTROINTESTINAL ASSESSMENT no GI complaint EXTREMITIES Full ROM  PLAN OF CARE Provide calm/safe environment. Vital signs assessed twice daily. ED BHU Assessment once each 12-hour shift. Collaborate with intake RN daily or as condition indicates. Assure the ED provider has rounded once each shift. Provide and encourage hygiene. Provide redirection as needed. Assess for escalating behavior; address immediately and inform ED provider.  Assess family dynamic and appropriateness for visitation as needed: Yes.  ; If necessary, describe findings:  Educate the patient/family about BHU procedures/visitation: Yes.  ; If necessary, describe findings:

## 2015-10-31 NOTE — ED Notes (Signed)
Pt given meal tray. At this time pt is in recliner in room eating

## 2015-10-31 NOTE — BH Assessment (Addendum)
Writer contacted [facility] and spoke to the RN, Silva Bandy  [phone]).  Per Silva Bandy, the patient has been accepted however, the discharge has not left and they do not expect the discharge to leave until this afternoon.    Writer inquire about placing the patient at [facility].   Writer will also fax the patient to other facilities in the event that [facility] is not able to accept the patient for placement this afternoon.

## 2015-10-31 NOTE — Patient Outreach (Addendum)
[facility]) Care Management  10/31/2015  SARY FISHBACK 09/17/1940 [id number]   Per medical record notes, patient will be transferring to [facility] hospital.  Intake appointment with therapist Celesta Gentile at [facility] cancelled for 11/02/15 at 12 pm.  Patient has a appointment scheduled with psychiatrist, Dr. Martina Sinner at the [facility] [place] on 11/24/15 at 1:15pm.    Sheralyn Boatman [facility] Care Management [phone]

## 2015-10-31 NOTE — Consult Note (Signed)
Paula Frank   Reason for Frank:  Follow-up Frank 75 year old woman with a history of bipolar disorder. She has been in the emergency room for over a day now awaiting some kind of transfer. [place] Referring Physician:  Reita Cliche Patient Identification: Paula Frank MRN:  [id number] Principal Diagnosis: Bipolar I disorder, most recent episode depressed, severe without psychotic features [facility]) Diagnosis:   Patient Active Problem List   Diagnosis Date Noted  . 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 Bipolar I disorder, most recent episode depressed, severe without psychotic features ([place]) [F31.4] 02/16/2015  . Alcohol abuse [F10.10] 02/16/2015  . 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 Current tobacco use [Z72.0] 02/19/2014  . Systemic lupus erythematosus ([place]) [M32.9] 01/14/2014  . Seizure ([place]) [R56.9] 10/30/2013  . Disordered sleep [G47.9] 10/30/2013  . 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 Blood pressure elevated [R03.0] 05/19/2012  . Fatigue [R53.83] 11/20/2011    Total Time spent with patient: 30 minutes  Subjective:   Paula Frank is a 75 y.o. female patient admitted with "I've just been very nervous".  HPI:  Patient interviewed. Chart reviewed including previous psychiatry notes as well as DTS notes. Vitals and labs in medicines reviewed. Patient tells me that she has recently been more nervous. She noticed increased forgetfulness. Her doctor told her that she was probably having the beginnings of dementia. Patient found this to be very nerve racking and made her feel even more anxious. She denies today that she is having any suicidal thoughts whatsoever. Denies any homicidal thoughts. Denies any hallucinations. She is rambling in her speech and tends to repeat the same stories about her life over and over again but is easily interrupted.  She is agreeable to an compliant with medicine. She has been eating and is ambulating without difficulty. Her boyfriend has shown up and she reports that she feels very comfortable with going home with him right now. Denies having any fear of that. He agrees to follow-up in the community.  Past Psychiatric History: Long-standing history of mood instability. Has had prior admissions. Has been stable in the past on medicines for bipolar disorder.  Risk to Self: Suicidal Ideation: No Suicidal Intent: No Is patient at risk for suicide?: No Suicidal Plan?: No Access to Means: No What has been your use of drugs/alcohol within the last 12 months?: Hx of alcohol use  (Pt sober 35 years ) How many times?: 0 Other Self Harm Risks: 0 Triggers for Past Attempts: Other (Comment) (N/A) Intentional Self Injurious Behavior: None Risk to Others: Homicidal Ideation: No Thoughts of Harm to Others: No Current Homicidal Intent: No Current Homicidal Plan: No Access to Homicidal Means: No Identified Victim: N/A History of harm to others?: No Assessment of Violence: None Noted Violent Behavior Description: N/A Does patient have access to weapons?: No Criminal Charges Pending?: No Does patient have a court date: No Prior Inpatient Therapy: Prior Inpatient Therapy: Yes Prior Therapy Dates: Unknown  Prior Therapy Facilty/Provider(s): [place]  Reason for Treatment: Depression  Prior Outpatient Therapy: Prior Outpatient Therapy: No Prior Therapy Dates: N/A Prior Therapy Facilty/Provider(s): N/A Reason for Treatment: N/A Does patient have an ACCT team?: No Does patient have Intensive In-House Services?  : No Does patient have [facility] services? : No Does patient have P4CC services?: No  Past Medical History:  Past Medical History  Diagnosis Date  . Lupus (systemic lupus erythematosus) ([place])   .  Discoid lupus   . Gastritis   . Chronic pain   . Restless leg   . Anxiety   . Diverticulitis   . Chronic  kidney disease   . Seizures [facility])     Past Surgical History  Procedure Laterality Date  . Tubal ligation    . Abdominal hysterectomy    . Laparoscopy    . Rotator cuff repair    . Lithotripsy    . Knee arthroscopy w/ meniscectomy    . Appendectomy    . Rotator cuff repair Bilateral     x2   Family History:  Family History  Problem Relation Age of Onset  . Cancer Son   . Cancer Mother   . Esophageal cancer Father   . Breast cancer Sister    Family Psychiatric  History: Positive for anxiety and depression Social History:  History  Alcohol Use  . 4.2 - 8.4 oz/week  . 7-14 Standard drinks or equivalent per week    Comment: 2 glasses of wine a day     History  Drug Use No    Social History   Social History  . Marital Status: Widowed    Spouse Name: N/A  . Number of Children: 3  . Years of Education: N/A   Occupational History  . retired Therapist, sports    Social History Main Topics  . Smoking status: Current Every Day Smoker -- 1.00 packs/day for 60 years    Types: Cigarettes  . Smokeless tobacco: Never Used  . Alcohol Use: 4.2 - 8.4 oz/week    7-14 Standard drinks or equivalent per week     Comment: 2 glasses of wine a day  . Drug Use: No  . Sexual Activity: Not Asked   Other Topics Concern  . None   Social History Narrative   Lives with friend.   She retired Therapist, sports and worked in psychiatry.         Additional Social History:    Allergies:   Allergies  Allergen Reactions  . Amoxicillin Other (See Comments)  . Ioxaglate     Other reaction(s): Unknown  . Ivp Dye [Iodinated Diagnostic Agents]     Other reaction(s): Unknown  . Levaquin [Levofloxacin In D5w]   . Levofloxacin     Other reaction(s): Unknown  . Moxifloxacin Hcl In Nacl     Other reaction(s): Unknown  . Erythromycin Rash    Canker sore  . Sulfa Antibiotics Rash    Other reaction(s): Unknown    Labs:  Results for orders placed or performed during the hospital encounter of 10/28/15 (from the  past 48 hour(s))  Valproic acid level     Status: Abnormal   Collection Time: 10/30/15  2:21 PM  Result Value Ref Range   Valproic Acid Lvl 30 (L) 50.0 - 100.0 ug/mL  Ammonia     Status: None   Collection Time: 10/30/15  2:21 PM  Result Value Ref Range   Ammonia 19 9 - 35 umol/L    Current Facility-Administered Medications  Medication Dose Route Frequency Provider Last Rate Last Dose  . acetaminophen (TYLENOL) tablet 1,000 mg  1,000 mg Oral Once Joanne Gavel, MD      . buPROPion (WELLBUTRIN XL) 24 hr tablet 150 mg  150 mg Oral Daily Jolanta B Pucilowska, MD   150 mg at 10/31/15 0932  . divalproex (DEPAKOTE) DR tablet 250 mg  250 mg Oral Q8H Jolanta B Pucilowska, MD   250 mg at 10/31/15 0932  .  doxepin (SINEQUAN) capsule 25 mg  25 mg Oral QHS Clovis Fredrickson, MD   25 mg at 10/30/15 2137  . ibuprofen (ADVIL,MOTRIN) tablet 600 mg  600 mg Oral Q6H PRN Loney Hering, MD   600 mg at 10/30/15 0351  . levETIRAcetam (KEPPRA) tablet 500 mg  500 mg Oral BID Clovis Fredrickson, MD   500 mg at 10/31/15 0932  . modafinil (PROVIGIL) tablet 200 mg  200 mg Oral Daily Jolanta B Pucilowska, MD   200 mg at 10/30/15 0940  . nicotine (NICODERM CQ - dosed in mg/24 hours) patch 21 mg  21 mg Transdermal Once Eula Listen, MD   21 mg at 10/30/15 2306  . QUEtiapine (SEROQUEL) tablet 100 mg  100 mg Oral QHS Clovis Fredrickson, MD   100 mg at 10/30/15 2131  . rOPINIRole (REQUIP) tablet 1 mg  1 mg Oral q morning - 10a Nance Pear, MD   1 mg at 10/31/15 0933  . rOPINIRole (REQUIP) tablet 2 mg  2 mg Oral QHS Nance Pear, MD   2 mg at 10/30/15 2132  . triamcinolone cream (KENALOG) 0.1 % 1 application  1 application Topical BID Eula Listen, MD   1 application at [id number]   Current Outpatient Prescriptions  Medication Sig Dispense Refill  . Armodafinil (NUVIGIL) 250 MG tablet Take 250 mg by mouth daily. Reported on 08/08/2015    . buPROPion (WELLBUTRIN SR) 100 MG 12 hr tablet  Take 100 mg by mouth daily.    Marland Kitchen buPROPion (WELLBUTRIN XL) 150 MG 24 hr tablet Take 1 tablet (150 mg total) by mouth daily. 30 tablet 0  . clobetasol ointment (TEMOVATE) [id number] % Apply 1 application topically 2 (two) times daily. Apply to arms and shins.    . diphenhydrAMINE (BENADRYL) 25 mg capsule Take 25 mg by mouth 2 (two) times daily as needed. Reported on 09/05/2015    . divalproex (DEPAKOTE ER) 250 MG 24 hr tablet Take 1 tablet by mouth daily. Reported on 09/05/2015    . divalproex (DEPAKOTE) 250 MG DR tablet Take 1 tablet (250 mg total) by mouth every 8 (eight) hours. 90 tablet 0  . doxepin (SINEQUAN) 25 MG capsule Take 1 capsule (25 mg total) by mouth at bedtime. 30 capsule 6  . doxepin (SINEQUAN) 25 MG capsule Take 1 capsule (25 mg total) by mouth at bedtime. 30 capsule 0  . doxycycline (VIBRA-TABS) 100 MG tablet Take 1 tablet (100 mg total) by mouth 2 (two) times daily. (Patient not taking: Reported on 09/05/2015) 20 tablet 0  . halobetasol (ULTRAVATE) 0.05 % ointment Apply 1 application topically 2 (two) times daily.  5  . levETIRAcetam (KEPPRA) 500 MG tablet Take 500 mg by mouth daily.    Marland Kitchen levETIRAcetam (KEPPRA) 500 MG tablet Take 1 tablet (500 mg total) by mouth 2 (two) times daily. 60 tablet 0  . montelukast (SINGULAIR) 10 MG tablet Take 1 tablet (10 mg total) by mouth daily. 30 tablet 6  . naproxen (NAPROSYN) 500 MG tablet Take 1 tablet (500 mg total) by mouth 2 (two) times daily with a meal. (Patient not taking: Reported on 08/08/2015) 20 tablet 0  . ondansetron (ZOFRAN) 4 MG tablet TAKE 1 TABLET (4 MG TOTAL) BY MOUTH EVERY 8 (EIGHT) HOURS AS NEEDED FOR NAUSEA OR VOMITING. 30 tablet 3  . oxymetazoline (AFRIN) 0.05 % nasal spray Place 1 spray into both nostrils 2 (two) times daily.    . predniSONE (DELTASONE) 10 MG tablet Take 3  tabs daily x 2 days, then 2 tabs daily for 2 days, then 1 tab daily for 2 days (3, 3, 2, 2, 1, 1.) (Patient not taking: Reported on 08/08/2015) 12 tablet 0  .  QUEtiapine (SEROQUEL) 400 MG tablet Take 400 mg by mouth at bedtime. Reported on 09/26/2015    . rOPINIRole (REQUIP) 1 MG tablet TAKE 1 TABLET EVERY MORNING AND 2 TABLETS EVERY NIGHT PATIENT NEEDS APPOINTMENT (Patient not taking: Reported on 09/26/2015) 90 tablet 0  . triamcinolone cream (KENALOG) 0.1 % Apply 1 application topically 2 (two) times daily. 60 g 2    Musculoskeletal: Strength & Muscle Tone: within normal limits Gait & Station: normal Patient leans: N/A  Psychiatric Specialty Exam: Review of Systems  Constitutional: Positive for weight loss.  HENT: Negative.   Eyes: Negative.   Respiratory: Negative.   Cardiovascular: Negative.   Gastrointestinal: Negative.   Musculoskeletal: Negative.   Skin: Negative.   Neurological: Negative.   Psychiatric/Behavioral: Positive for memory loss. Negative for depression, suicidal ideas, hallucinations and substance abuse. The patient is nervous/anxious. The patient does not have insomnia.     Blood pressure 152/97, pulse 101, temperature 98.5 F (36.9 C), temperature source Oral, resp. rate 16, height 5\' 6"  (1.676 m), weight 54.432 kg (120 lb), SpO2 99 %.Body mass index is 19.38 kg/(m^2).  General Appearance: Casual  Eye Contact::  Good  Speech:  Normal Rate  Volume:  Normal  Mood:  Euthymic  Affect:  Appropriate  Thought Process:  Tangential  Orientation:  Full (Time, Place, and Person)  Thought Content:  Negative  Suicidal Thoughts:  No  Homicidal Thoughts:  No  Memory:  Immediate;   Good Recent;   Fair Remote;   Fair  Judgement:  Fair  Insight:  Fair  Psychomotor Activity:  Normal  Concentration:  Fair  Recall:  [employer] [place]  Language: Fair  Akathisia:  No  Handed:  Right  AIMS (if indicated):     Assets:  Desire for Improvement Housing Resilience Social Support  ADL's:  Intact  Cognition: Impaired,  Mild  Sleep:      Treatment Plan Summary: Medication management and Plan Patient was restarted on  psychiatric medicines by the psychiatry Frank person over the weekend. She has not shown any dangerous behaviors in the emergency room. There does not seem to be a clear definite time for her to go to another hospital and in interview today she says that she is really not wanting to go to a hospital at this point, particularly not someplace farther away. I agreed to discontinue her IVC. Printed out her mood prescriptions. Case reviewed with emergency room doctor. Patient no longer meets commitment criteria and can follow-up with her regular outpatient doctors. She agrees to the plan.  Disposition: Patient does not meet criteria for psychiatric inpatient admission. Supportive therapy provided about ongoing stressors.  Alethia Berthold, MD 10/31/2015 4:24 PM

## 2015-10-31 NOTE — ED Notes (Signed)
She is awake sitting on the side of the bed - pt disoriented to time  Talkative this am - crying at times over her sons death - reports of loss   Second son in [place] and third son with dx bipolar that she last saw last week  "I have no idea where he is."  Pt tearful/ informed her of possible transfer to [facility]  Reassured her that I will keep her informed

## 2015-10-31 NOTE — ED Notes (Signed)
[place] is currently consulting

## 2015-10-31 NOTE — ED Provider Notes (Signed)
Case discussed with Dr. Weber Cooks of psychiatry after his evaluation in the emergency department, feels that the patient is psychiatrically stable. Remains medically stable. Dr. Weber Cooks recommends no changes to pharmacologic therapy, does not feel that admission would be beneficial and the patient is not an imminent risk to self or others, we'll plan for discharge home and follow-up with the patient's establish outpatient mental health provider, Dr. Adria Devon at [facility].  Carrie Mew, MD 10/31/15 [phone]

## 2015-11-03 ENCOUNTER — Other Ambulatory Visit: Payer: Self-pay | Admitting: *Deleted

## 2015-11-03 NOTE — Patient Outreach (Signed)
[facility]) Care Management  11/03/2015  Paula Frank December 14, 1940 [id number]   Follow up phone call to patient to provide requested community resources for personal care services. Roommate Nicole Kindred answered the phone.  Patient was heard saying that she would call this social worker back when she woke up.  Plan: This Education officer, museum to continue to follow up.    Sheralyn Boatman [facility] Care Management [phone]

## 2015-11-04 ENCOUNTER — Other Ambulatory Visit: Payer: Self-pay | Admitting: *Deleted

## 2015-11-04 NOTE — Patient Outreach (Addendum)
[facility]) Care Management  11/04/2015  NAOMI PALLEY [birth year] [id number]   Return phone call from patient stating that she is still down, as if she is in a black hole and does not know what to do.  Patient continues to report need for Seroquel to help her sleep.  Per patient, her Wellbutrin was increased but her Seroquel was not refilled.  Patient refuses to see previous psychiatrist, is reluctant to call primary care doctor for follow up appointment but agrees to so so by Monday.  Patient continues to be interested in moving to an Assisted Living.  FL2 previously sent to patient's provider's office to complete.  Patient denies any thoughts of harming herself or others.  Per patient, " I am very down and depressed but I want to live."   Plan:  Patient to schedule follow up appointment with primary care provider by 11/07/15.   Sheralyn Boatman [facility] Care Management [phone]

## 2015-11-07 ENCOUNTER — Other Ambulatory Visit: Payer: Self-pay | Admitting: *Deleted

## 2015-11-07 ENCOUNTER — Ambulatory Visit: Payer: Self-pay | Admitting: Family Medicine

## 2015-11-07 NOTE — Patient Outreach (Signed)
[facility]) Care Management  11/07/2015  Paula Frank Oct 28, 1940 [id number]  Phone call from patient stating that she was in pain.  Per pateint, she did not get very much sleep last night and she had pain in her legs.  Patient did not go to her doctor's appointment scheduled for today stating "It is not going to help" .  This social worker explained that she needed to be seen by the doctor in order to be evaluated for an Assisted Living. Patient's roommate Paula Frank given the phone, who stated that he would not re-schedule the appointment because she is refusing to go.  Patient also refusing to follow up with the psychiatrist and therapist that she was previously referred to .   Plan:  This social worker will contact patient's doctor to discuss lack of progress made and recommendations before closing her case.  Paula Frank [facility] Care Management [phone]

## 2015-11-08 ENCOUNTER — Other Ambulatory Visit: Payer: Self-pay | Admitting: *Deleted

## 2015-11-08 NOTE — Patient Outreach (Signed)
[facility]) Care Management  11/08/2015  Paula Frank 1941/02/28 [id number]    Phone call from patient stating that she is nauseated and anxious.  Continued conflicts and arguments heard over the phone with roommate.  Patient states that she is safe, and continues to refuse to contact the doctor to re-schedule her appointment.  Patient only wanted to speak briefly., stating that she was going to take a nap.  Patient will call this social worker in the morning.   Plan: This social worker will contact patient in the morning to discuss continued need for social work involvement.     Sheralyn Boatman [facility] Care Management [phone]

## 2015-11-11 ENCOUNTER — Other Ambulatory Visit: Payer: Self-pay | Admitting: *Deleted

## 2015-11-11 NOTE — Patient Outreach (Signed)
[facility]) Care Management  11/11/2015  Paula Frank 29-Nov-1940 [id number]   Phone call from patient's roommate requesting phone number for Home Care Providers.  Per patient's roommate, patient needs assistance with her ADL's and household duties.  Phone number provided.  Patient not available to talk on the phone, per roommate patient was asleep.   Plan;  This Education officer, museum will follow up with patient next week.   Sheralyn Boatman [facility] Care Management [phone]

## 2015-11-14 ENCOUNTER — Other Ambulatory Visit: Payer: Self-pay | Admitting: *Deleted

## 2015-11-14 NOTE — Patient Outreach (Signed)
[facility]) Care Management  11/14/2015  Paula Frank 02/22/1941 [id number]  Phone call to patient who agrees to custodial care, has contacted [place] providers and [facility], however they have not returned her call.  Patient continues to discuss need for Assisted Living Care and getting her Seroquel re-filled.  However is was explained that she will need to see her primary care doctor in order to get the [facility] 2 signed.  Patient will also have to follow up with her psychiatrist for any psychotropic medication needs.  Patient has agreed to follow up with her primary care doctor and has also greed to keep her appointment with her psychiatrist on 11/24/15 at 1:15pm. Patient has suspected fraudulent activity on her debit card.  This Education officer, museum strongly encouraged patient to contact her bank to report this.  Plan:  This Education officer, museum will contact [facility] and [place] Providers to follow up on referral.    [place], [place] Management [phone]

## 2015-11-16 ENCOUNTER — Other Ambulatory Visit: Payer: Self-pay | Admitting: *Deleted

## 2015-11-16 NOTE — Patient Outreach (Signed)
[facility]) Care Management  11/16/2015  Paula Frank Apr 17, 1941 [id number]   Phone call to patient to follow up on personal care services.  HIPPA compliant voicemail message left for a return call.   Sheralyn Boatman [facility] Care Management [phone]

## 2015-11-17 ENCOUNTER — Encounter: Payer: Self-pay | Admitting: *Deleted

## 2015-11-17 ENCOUNTER — Other Ambulatory Visit: Payer: Self-pay | Admitting: *Deleted

## 2015-11-17 NOTE — Patient Outreach (Signed)
[place] [facility]) Care Management  11/17/2015  Paula Frank [birth year] [id number]   Voicemail received from patient requesting a return call. Call returned. HIPPA compliant voicemail message left requesting a return call.    Sheralyn Boatman [facility] Care Management [phone]

## 2015-11-17 NOTE — Patient Outreach (Signed)
[facility] [facility]) Care Management  11/17/2015  Paula Frank 01/14/1941 [id number]   Phone call from patient's roommate [name] who status that patient is refusing to keep her medical appointments and is now refusing personal care services previously requested.  Patient also refusing home visit from this social worker and is not taking calls.  [name] admits that patient is an alcoholic and that he provides her with the wine.  Per [name], he knows that this is not the right thing to do but she gets very upset when the alcohol is not provided. Patient is refusing drug treatment as well and per [name] will not return to Alcoholics Anonymous.  This enabling behavior not being beneficial to patient discussed. Continued effort to get patient to the doctor emphasized.  Next appointment with Psychiatrist, Dr. Casimiro Needle  scheduled for 11/24/15.  Per [name], he is not sure what to do next.  He has the contact information for mobile crisis, has contacted them however they will not come out without her okay.   Plan:  Home visit scheduled for  11/22/15 at 12:30pm.   Sheralyn Boatman [facility] Care Management [phone]

## 2015-11-21 ENCOUNTER — Encounter: Payer: Self-pay | Admitting: Emergency Medicine

## 2015-11-21 ENCOUNTER — Emergency Department
Admission: EM | Admit: 2015-11-21 | Discharge: 2015-11-21 | Disposition: A | Discharge status: Home / Self Care | Payer: Medicare Other | Attending: Emergency Medicine | Admitting: Emergency Medicine

## 2015-11-21 DIAGNOSIS — Z7952 Long term (current) use of systemic steroids: Secondary | ICD-10-CM | POA: Diagnosis not present

## 2015-11-21 DIAGNOSIS — F319 Bipolar disorder, unspecified: Secondary | ICD-10-CM | POA: Diagnosis not present

## 2015-11-21 DIAGNOSIS — G309 Alzheimer's disease, unspecified: Secondary | ICD-10-CM | POA: Diagnosis not present

## 2015-11-21 DIAGNOSIS — R202 Paresthesia of skin: Secondary | ICD-10-CM | POA: Diagnosis present

## 2015-11-21 DIAGNOSIS — N390 Urinary tract infection, site not specified: Secondary | ICD-10-CM | POA: Diagnosis not present

## 2015-11-21 DIAGNOSIS — F0281 Dementia in other diseases classified elsewhere with behavioral disturbance: Secondary | ICD-10-CM | POA: Insufficient documentation

## 2015-11-21 DIAGNOSIS — Z792 Long term (current) use of antibiotics: Secondary | ICD-10-CM | POA: Insufficient documentation

## 2015-11-21 DIAGNOSIS — N189 Chronic kidney disease, unspecified: Secondary | ICD-10-CM | POA: Insufficient documentation

## 2015-11-21 DIAGNOSIS — F17210 Nicotine dependence, cigarettes, uncomplicated: Secondary | ICD-10-CM | POA: Insufficient documentation

## 2015-11-21 DIAGNOSIS — Z79899 Other long term (current) drug therapy: Secondary | ICD-10-CM | POA: Insufficient documentation

## 2015-11-21 DIAGNOSIS — M19019 Primary osteoarthritis, unspecified shoulder: Secondary | ICD-10-CM | POA: Diagnosis not present

## 2015-11-21 DIAGNOSIS — Z8669 Personal history of other diseases of the nervous system and sense organs: Secondary | ICD-10-CM | POA: Diagnosis not present

## 2015-11-21 DIAGNOSIS — Z791 Long term (current) use of non-steroidal anti-inflammatories (NSAID): Secondary | ICD-10-CM | POA: Insufficient documentation

## 2015-11-21 DIAGNOSIS — L280 Lichen simplex chronicus: Secondary | ICD-10-CM | POA: Diagnosis not present

## 2015-11-21 LAB — COMPREHENSIVE METABOLIC PANEL
ALBUMIN: 3.6 g/dL (ref 3.5–5.0)
ALK PHOS: 102 U/L (ref 38–126)
ALT: 14 U/L (ref 14–54)
ANION GAP: 6 (ref 5–15)
AST: 21 U/L (ref 15–41)
BILIRUBIN TOTAL: 0.4 mg/dL (ref 0.3–1.2)
BUN: 21 mg/dL — ABNORMAL HIGH (ref 6–20)
CALCIUM: 9.2 mg/dL (ref 8.9–10.3)
CO2: 26 mmol/L (ref 22–32)
Chloride: 109 mmol/L (ref 101–111)
Creatinine, Ser: 0.79 mg/dL (ref 0.44–1.00)
GFR calc Af Amer: >60 mL/min (ref >60)
GFR calc non Af Amer: >60 mL/min (ref >60)
GLUCOSE: 96 mg/dL (ref 65–99)
POTASSIUM: 4.1 mmol/L (ref 3.5–5.1)
SODIUM: 141 mmol/L (ref 135–145)
TOTAL PROTEIN: 7.1 g/dL (ref 6.5–8.1)

## 2015-11-21 LAB — CBC WITH DIFFERENTIAL/PLATELET
BASOS ABS: 0 10*3/uL (ref 0–0.1)
BASOS PCT: 1 %
Eosinophils Absolute: 0.1 10*3/uL (ref 0–0.7)
Eosinophils Relative: 2 %
HEMATOCRIT: 37.3 % (ref 35.0–47.0)
HEMOGLOBIN: 12.3 g/dL (ref 12.0–16.0)
LYMPHS PCT: 29 %
Lymphs Abs: 1.7 10*3/uL (ref 1.0–3.6)
MCH: 32.6 pg (ref 26.0–34.0)
MCHC: 33 g/dL (ref 32.0–36.0)
MCV: 98.7 fL (ref 80.0–100.0)
Monocytes Absolute: 0.7 10*3/uL (ref 0.2–0.9)
Monocytes Relative: 11 %
NEUTROS ABS: 3.4 10*3/uL (ref 1.4–6.5)
NEUTROS PCT: 57 %
Platelets: 366 10*3/uL (ref 150–440)
RBC: 3.77 MIL/uL — ABNORMAL LOW (ref 3.80–5.20)
RDW: 14.9 % — ABNORMAL HIGH (ref 11.5–14.5)
WBC: 5.9 10*3/uL (ref 3.6–11.0)

## 2015-11-21 LAB — URINALYSIS COMPLETE WITH MICROSCOPIC (ARMC ONLY)
Bilirubin Urine: NEGATIVE
Glucose, UA: NEGATIVE mg/dL
HGB URINE DIPSTICK: NEGATIVE
Ketones, ur: NEGATIVE mg/dL
NITRITE: NEGATIVE
PH: 5 (ref 5.0–8.0)
PROTEIN: NEGATIVE mg/dL
SPECIFIC GRAVITY, URINE: 1.015 (ref 1.005–1.030)

## 2015-11-21 LAB — AMMONIA: Ammonia: 17 umol/L (ref 9–35)

## 2015-11-21 MED ORDER — NITROFURANTOIN MACROCRYSTAL 100 MG PO CAPS: 100.0000 mg | ORAL_CAPSULE | Freq: Four times a day (QID) | ORAL | Status: AC

## 2015-11-21 MED ORDER — LORAZEPAM 2 MG/ML IJ SOLN
1.0000 mg | Freq: Once | INTRAMUSCULAR | Status: AC
  Administered 2015-11-21: 1 mg via INTRAVENOUS
  Filled 2015-11-21: qty 1

## 2015-11-21 MED ORDER — DIPHENHYDRAMINE HCL 50 MG/ML IJ SOLN
50.0000 mg | Freq: Once | INTRAMUSCULAR | Status: AC
  Administered 2015-11-21: 50 mg via INTRAVENOUS
  Filled 2015-11-21: qty 1

## 2015-11-21 MED ORDER — DIPHENHYDRAMINE HCL 25 MG PO CAPS
50.0000 mg | ORAL_CAPSULE | Freq: Once | ORAL | Status: AC
  Administered 2015-11-21: 50 mg via ORAL
  Filled 2015-11-21: qty 2

## 2015-11-21 MED ORDER — TRIAMCINOLONE ACETONIDE 0.5 % EX OINT: 1.0000 "application " | TOPICAL_OINTMENT | Freq: Two times a day (BID) | CUTANEOUS | Status: DC

## 2015-11-21 MED ORDER — LORAZEPAM 1 MG PO TABS
1.0000 mg | ORAL_TABLET | Freq: Once | ORAL | Status: AC
  Administered 2015-11-21: 1 mg via ORAL
  Filled 2015-11-21: qty 1

## 2015-11-21 NOTE — ED Notes (Signed)
Pt to ed with c/o "my skin is burning all over"  Per family it has been going on for weeks,  Pt reports worse last night.

## 2015-11-21 NOTE — ED Provider Notes (Signed)
Time Seen: Approximately 1005 I have reviewed the triage notes  Chief Complaint: skin burning    History of Present Illness: Paula Frank is a 75 y.o. female who presents with feelings of "" skin burning all over "". Reviewed the records shows this appears to be occurring for a significant period of time and the patient was seen and evaluated here in the early part of the month. She has a history of a neurodermatitis [place]. She denies any suicidal thoughts, homicidal thoughts, hallucinations. Talking to the patient's significant other appears that her son has left the household and he has to take care of her of her by himself. He states that she's been noncompliant with her medications, etc. There does not appear to be any new physical complaints. The patient describes itching all over but states is mostly in her right lower extremity where there is obvious wounds started created by her fingernails. She denies any fever, chest pain, abdominal pain. States she had a fall approximately a week ago and struck the right side of her head but denies any loss of consciousness and significant other states no altered mental status. Past Medical History  Diagnosis Date  . Lupus (systemic lupus erythematosus) ([place])   . Discoid lupus   . Gastritis   . Chronic pain   . Restless leg   . Anxiety   . Diverticulitis   . Chronic kidney disease   . Seizures [facility])     Patient Active Problem List   Diagnosis Date Noted  . 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 Bipolar I disorder, most recent episode depressed, severe without psychotic features ([place]) 02/16/2015  . Alcohol abuse 02/16/2015  . Alzheimer's dementia 02/16/2015  . 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 Current tobacco use 02/19/2014  . Systemic lupus erythematosus ([place]) 01/14/2014  . Seizure ([place]) 10/30/2013  . Disordered sleep 10/30/2013  . Adynamia 10/30/2013  . Blood pressure elevated  05/19/2012  . Fatigue 11/20/2011    Past Surgical History  Procedure Laterality Date  . Tubal ligation    . Abdominal hysterectomy    . Laparoscopy    . Rotator cuff repair    . Lithotripsy    . Knee arthroscopy w/ meniscectomy    . Appendectomy    . Rotator cuff repair Bilateral     x2    Past Surgical History  Procedure Laterality Date  . Tubal ligation    . Abdominal hysterectomy    . Laparoscopy    . Rotator cuff repair    . Lithotripsy    . Knee arthroscopy w/ meniscectomy    . Appendectomy    . Rotator cuff repair Bilateral     x2    Current Outpatient Rx  Name  Route  Sig  Dispense  Refill  . Armodafinil (NUVIGIL) 250 MG tablet   Oral   Take 250 mg by mouth daily. Reported on 08/08/2015         . buPROPion (WELLBUTRIN XL) 150 MG 24 hr tablet   Oral   Take 1 tablet (150 mg total) by mouth daily.   30 tablet   0   . clobetasol ointment (TEMOVATE) 0.05 %   Topical   Apply 1 application topically 2 (two) times daily. Apply to arms and shins.         . diphenhydrAMINE (BENADRYL) 25 mg capsule   Oral   Take 25 mg by mouth 2 (two) times daily as needed.  Reported on 09/05/2015         . divalproex (DEPAKOTE) 250 MG DR tablet   Oral   Take 1 tablet (250 mg total) by mouth every 8 (eight) hours.   90 tablet   0   . doxepin (SINEQUAN) 25 MG capsule   Oral   Take 1 capsule (25 mg total) by mouth at bedtime.   30 capsule   0   . doxycycline (VIBRA-TABS) 100 MG tablet   Oral   Take 1 tablet (100 mg total) by mouth 2 (two) times daily. Patient not taking: Reported on 09/05/2015   20 tablet   0   . halobetasol (ULTRAVATE) 0.05 % ointment   Topical   Apply 1 application topically 2 (two) times daily.      5   . levETIRAcetam (KEPPRA) 500 MG tablet   Oral   Take 1 tablet (500 mg total) by mouth 2 (two) times daily.   60 tablet   0   . montelukast (SINGULAIR) 10 MG tablet   Oral   Take 1 tablet (10 mg total) by mouth daily.   30 tablet   6    . naproxen (NAPROSYN) 500 MG tablet   Oral   Take 1 tablet (500 mg total) by mouth 2 (two) times daily with a meal. Patient not taking: Reported on 08/08/2015   20 tablet   0   . ondansetron (ZOFRAN) 4 MG tablet      TAKE 1 TABLET (4 MG TOTAL) BY MOUTH EVERY 8 (EIGHT) HOURS AS NEEDED FOR NAUSEA OR VOMITING.   30 tablet   3   . oxymetazoline (AFRIN) 0.05 % nasal spray   Each Nare   Place 1 spray into both nostrils 2 (two) times daily.         . predniSONE (DELTASONE) 10 MG tablet      Take 3 tabs daily x 2 days, then 2 tabs daily for 2 days, then 1 tab daily for 2 days (3, 3, 2, 2, 1, 1.) Patient not taking: Reported on 08/08/2015   12 tablet   0   . QUEtiapine (SEROQUEL) 400 MG tablet   Oral   Take 400 mg by mouth at bedtime. Reported on 09/26/2015         . rOPINIRole (REQUIP) 1 MG tablet      TAKE 1 TABLET EVERY MORNING AND 2 TABLETS EVERY NIGHT PATIENT NEEDS APPOINTMENT Patient not taking: Reported on 09/26/2015   90 tablet   0     Last refill until she sees a Psychiatrist.   . triamcinolone cream (KENALOG) 0.1 %   Topical   Apply 1 application topically 2 (two) times daily.   60 g   2     Allergies:  Amoxicillin; Ioxaglate; Ivp dye; Levaquin; Levofloxacin; Moxifloxacin hcl in nacl; Erythromycin; and Sulfa antibiotics  Family History: Family History  Problem Relation Age of Onset  . Cancer Son   . Cancer Mother   . Esophageal cancer Father   . Breast cancer Sister     Social History: Social History  Substance Use Topics  . Smoking status: Current Every Day Smoker -- 1.00 packs/day for 60 years    Types: Cigarettes  . Smokeless tobacco: Never Used  . Alcohol Use: 4.2 - 8.4 oz/week    7-14 Standard drinks or equivalent per week     Comment: 2 glasses of wine a day     Review of Systems:   10 point review of  systems was performed and was otherwise negative:  Constitutional: No fever Eyes: No visual disturbances ENT: No sore throat, ear  pain Cardiac: No chest pain Respiratory: No shortness of breath, wheezing, or stridor Abdomen: No abdominal pain, no vomiting, No diarrhea Endocrine: No weight loss, No night sweats Extremities: No peripheral edema, cyanosis Skin: No rashes, easy bruising Neurologic: No focal weakness, trouble with speech or swollowing Urologic: No dysuria, Hematuria, or urinary frequency   Physical Exam:  ED Triage Vitals  Enc Vitals Group     BP 11/21/15 0951 148/89 mmHg     Pulse Rate 11/21/15 0951 97     Resp 11/21/15 0951 20     Temp 11/21/15 0951 98.2 F (36.8 C)     Temp Source 11/21/15 0951 Oral     SpO2 11/21/15 0951 97 %     Weight 11/21/15 0951 120 lb (54.432 kg)     Height --      Head Cir --      Peak Flow --      Pain Score 11/21/15 0951 8     Pain Loc --      Pain Edu? --      Excl. in [place]? --     General: Awake , Alert , and Oriented times 3; GCS 15 Patient appears agitated moving constantly but able to answer all questions appropriately with no signs of respiratory distress. Head: Normal cephalic , atraumatic Eyes: Pupils equal , round, reactive to light Nose/Throat: No nasal drainage, patent upper airway without erythema or exudate.  Neck: Supple, Full range of motion, No anterior adenopathy or palpable thyroid masses Lungs: Clear to ascultation without wheezes , rhonchi, or rales Heart: Regular rate, regular rhythm without murmurs , gallops , or rubs Abdomen: Soft, non tender without rebound, guarding , or rigidity; bowel sounds positive and symmetric in all 4 quadrants. No organomegaly .        Extremities: 2 plus symmetric pulses. No edema, clubbing or cyanosis Neurologic: normal ambulation, Motor symmetric without deficits, sensory intact Skin: Numerous superficial abrasions nose across both lower extremities mainly and some occasional areas of both upper extremities. He does not appear to be any signs of scabies or bedbugs bites.   Labs:   All laboratory work was  reviewed including any pertinent negatives or positives listed below:  Labs Reviewed  AMMONIA  COMPREHENSIVE METABOLIC PANEL  CBC WITH DIFFERENTIAL/PLATELET  URINALYSIS COMPLETEWITH MICROSCOPIC ([place])      ED Course: * Patient's stay here showed very little improvement. Patient has been worked up for her burning in her skin that she describes. There is does not seem to be any obvious medical reason at this time. She's also been seen by dermatology and psychiatry. Patient had attempted treatment for her skin complaints with Ativan and Benadryl which did not seem to show much improvement. She does have a urinary tract infection and urine culture was added to her evaluation today. She states that she has a "" jar of cream "" that's been given to her by the dermatologist that evaluated her. She has not used the medication. Her family that's with her states that she has a history of noncompliance, etc. I was not aware of any certain medication and I could prescribe the patient to relieve her of her symptoms at this time. I also felt there was no reason to hospitalize the patient.    Assessment:  Neurodermatitis      Plan: * Social work was consulted to  help the family at home. The patient has dementia but at this time does not meet criteria for involuntary commitment. The skin complaints that the patient has has no obvious medical etiology I attempted to discharge her home with a prescription for triamcinolone ointment. Otherwise, she should continue with the medication was also prescribed by her dermatologist and other prescription medication for home. I also wrote a prescription for Macrodantin for the urinary tract infection with the urine culture pending.           Daymon Larsen, MD 11/21/15 [phone]

## 2015-11-21 NOTE — Care Management Note (Signed)
Case Management Note  Patient Details  Name: Paula Frank MRN: [id number] Date of Birth: March 06, 1941  Subjective/Objective:     Spoke to the patient and got permission for her roommate, at bedside to be  Included. It turns out the gentleman is her roommate and part time caregiver. He is at his wits end. In [place] about homecare he states the pt. Was given a referral from her PCP, Dr Luan Pulling to be seen by [facility], and did not return their call to set up services.       I have explained to both pt. And [name] Angotta-[phone], that the best course is to re-contact the agency and see  If they can help. The patient is non-compliant with meds, and needs some help with direction for self care, per Mr Gentry Roch. MD has been made aware.      Action/Plan:   Expected Discharge Date:                  Expected Discharge Plan:     In-House Referral:     Discharge planning Services     Post Acute Care Choice:    Choice offered to:     DME Arranged:    DME Agency:     HH Arranged:    [place] Agency:     Status of Service:     Medicare Important Message Given:    Date Medicare IM Given:    Medicare IM give by:    Date Additional Medicare IM Given:    Additional Medicare Important Message give by:     If discussed at [place] of Stay Meetings, dates discussed:    Additional Comments:  Beau Fanny, RN 11/21/2015, 10:49 AM

## 2015-11-21 NOTE — ED Notes (Signed)
Pt complains of itching of her skin and anxiety, pt reports being anxious denies HI/SI

## 2015-11-21 NOTE — Progress Notes (Signed)
CSW was consulted. Discussed patient with RNCM. Per [facility] patient will discharge home with [facility] services. There are no CSW needs at this time. CSW is signing off but is available if a CSW need were to arise.   Ernest Pine, MSW, [place] Social Work Department [phone]

## 2015-11-22 ENCOUNTER — Ambulatory Visit: Payer: Self-pay | Admitting: *Deleted

## 2015-11-22 ENCOUNTER — Other Ambulatory Visit: Payer: Self-pay | Admitting: *Deleted

## 2015-11-22 LAB — URINE CULTURE

## 2015-11-22 NOTE — Patient Outreach (Signed)
[place] [facility]) Care Management  11/22/2015  Paula Frank Jun 13, 1941 [id number]   Phone call to patient's roommate to confirm home visit scheduled for today.  Per patient's roommate she is refusing visits and calls today.  Patient went to the ED over there weekend and discharged home with home health services.  Patient's roommate reminded that patient has an appointment with her Psychiatrist on 11/24/15.  Plan:  This social worker will follow up with patient on 11/23/15.   Sheralyn Boatman [facility] Care Management [phone]

## 2015-11-23 ENCOUNTER — Other Ambulatory Visit: Payer: Self-pay | Admitting: *Deleted

## 2015-11-23 ENCOUNTER — Ambulatory Visit: Payer: Self-pay | Admitting: *Deleted

## 2015-11-23 NOTE — Patient Outreach (Signed)
[place] [facility]) Care Management  11/23/2015  Paula Frank 12-Nov-1940 [id number]   Phone call from patient's roommate stating that patient has been sleeping all day.  Patient continues to refuse call.  Per roommate patient eats, smokes and goes back to bed.  Reminder call received for her appointment with her psychiatrist, however it is not likely that patient will keep appointment.  Home  services have not started yet.  Patient has received no call.  Plan:  This Education officer, museum to contact collaborate with patient's psychiatrist.  Elliot Gurney, [place] Management [phone]

## 2015-11-23 NOTE — Patient Outreach (Signed)
[place] [facility]) Care Management  11/23/2015  ILSA GILLEM [birth year] [id number]   Phone call from patient's roommate [name], who stated that her appointment with the psychiatrist at [place] was re-scheduled for 12/09/15 at 3 pm.    [place], [place] Management [phone]

## 2015-11-25 ENCOUNTER — Other Ambulatory Visit: Payer: Self-pay | Admitting: *Deleted

## 2015-11-25 NOTE — Patient Outreach (Addendum)
[place] [facility]) Care Management  11/25/2015  Paula Frank 07/08/1940 [id number]    Phone call to patient's psychiatrist Dr. Casimiro Needle to discuss patient concerns.  Patient's psychiatrist on vacation to return on 11/30/15, however he will make patient a priority to see her sooner than 12/09/15.  Phone call made to patient's roommate to follow up on assessment for personal care services with [facility] [place].  Per patient's roommate, the coordinator arrived to her home and requested that patient not smoke during the assessment.  Patient went to lie down after this request and the coordinator left. Assessment was not completed.  Per patient's roommate, he is not sure how much patient is drinking, however "there is a empty jug in the refrigerator".  Patient's roommate further states that he will be going out of town on 12/15/15-12/19/15 and is concerned about patient staying in the apartment alone.   Plan:  This social worker will contact patient within 1 week after re-scheduling patient appointment  with Dr. Casimiro Needle.     Sheralyn Boatman [facility] Care Management [phone]

## 2015-11-28 ENCOUNTER — Other Ambulatory Visit: Payer: Self-pay | Admitting: Psychiatry

## 2015-11-28 ENCOUNTER — Other Ambulatory Visit: Payer: Self-pay | Admitting: *Deleted

## 2015-11-28 ENCOUNTER — Encounter: Payer: Self-pay | Admitting: *Deleted

## 2015-11-28 NOTE — Patient Outreach (Signed)
[place] [facility]) Care Management  11/28/2015  Paula Frank 09/23/1940 [id number]    Phone call to patient to follow up on appointment made to her primary care doctor.  Patient overheard saying " I am busy, I don't want to talk right now".   Plan:  This social worker will collaborate with patient's psychiatrist on 11/30/15.    Sheralyn Boatman [facility] Care Management [phone]

## 2015-11-28 NOTE — Patient Outreach (Addendum)
[place] [facility]) Care Management  11/28/2015  JENASYS JIMMERSON Dec 02, 1940 [id number]   Late Entry- Phone call from patient on 11/27/15 stating that she and her roommate were in a verbal altercation.  The police were called and she needs to move as soon as possible.  Per patient, her Christain [place] took her out to see several places, however she does not have picture identification and they would not take a money order for deposit.  Patient stated however that she would rather be in an Assisted Living.  "I need help".   Patient ingking alcojol addressed .  Patient states that she knows that she needs to go back to alcohol anonymous but is too ashamed to go back.  This Education officer, museum stressed to her the importance of getting substance abuse treatment.  This Education officer, museum also strongly suggested that she make an appointment to see her primary care doctor in order to have the [place] completed for placement.  Patient also informed that her Psychiatrist(Dr. Martina Sinner)  will be able to schedule a sooner appointment upon his return from vacation on Wednesday.  Plan:  Patient agrees to contact her primary care doctor for an appointment on 11/28/15.  Social Worker to follow up with patient on 11/28/15.   Sheralyn Boatman [facility] Care Management [phone]

## 2015-11-30 ENCOUNTER — Other Ambulatory Visit: Payer: Self-pay | Admitting: *Deleted

## 2015-11-30 NOTE — Patient Outreach (Signed)
[place] [facility]) Care Management  11/30/2015  Paula Frank [birth year] [id number]   Phone call to patient's psychiatrist Dr. Casimiro Needle to coordinate care.  There was no answer, no voicemail able to be left.    Paula Frank [facility] Care Management [phone]

## 2015-12-01 ENCOUNTER — Other Ambulatory Visit: Payer: Self-pay | Admitting: Psychiatry

## 2015-12-02 ENCOUNTER — Other Ambulatory Visit: Payer: Self-pay | Admitting: *Deleted

## 2015-12-02 NOTE — Patient Outreach (Signed)
[place] [facility]) Care Management  12/02/2015  TABIA JAMROG 09/09/1940 [id number]   Phone call to patient's psychiatrist Dr. Casimiro Needle.  Patient's daily depression, lack of sleep and alcohol use discussed.  Psychiatrist willing to start Seroquel and will send prescription to patient's pharmacy. Voicemail message left with Sharyn Lull at [facility] to provide her with the pharmacy information so that the prescription can be called in.  Phone call to patient, informed her of this.  Patient now requesting Nuvigil.  Patient has a follow up appointment with Dr. Casimiro Needle on 12/09/15 at 3 pm.  She can discuss this with him at that time.  Patient has hired a Retail banker aid who will start on Monday.  Patient has long term plan of moving to [place] with her son within the next 3 months.    Plan:  Follow up with patient within 7 business days.   Sheralyn Boatman [facility] Care Management [phone]

## 2015-12-05 ENCOUNTER — Other Ambulatory Visit: Payer: Self-pay | Admitting: *Deleted

## 2015-12-05 NOTE — Patient Outreach (Signed)
[place] [facility]) Care Management  12/05/2015  Paula Frank 06-Sep-1940 [id number]   Phone call to patient to follow up on re-fill on Seroquel.  Per patient, she has not received it.  Phone call to [place], spoke with Dr. Casimiro Needle and the front office who stated that they will sent the prescription re-fill to her pharmacy today.

## 2015-12-06 ENCOUNTER — Ambulatory Visit: Payer: Self-pay | Admitting: *Deleted

## 2015-12-07 NOTE — Progress Notes (Signed)
This encounter was created in error - please disregard.

## 2015-12-08 ENCOUNTER — Other Ambulatory Visit: Payer: Self-pay | Admitting: *Deleted

## 2015-12-08 NOTE — Patient Outreach (Signed)
[facility]) Care Management  12/08/2015  Paula Frank 1941-02-18 [id number]   Follow up phone call to patient.  Per patient she has received her Seroquel from the psychiatrist and has been compliant.  Per patient, she has a scheduled appointment with her psychiatrist on 12/09/15 at 3 pm.  [place] arranged transportation. Patient's roommate's sister and a friend of hers came by to help her clean.  Patient continues with her plan to move to [place] with her son in the next few months, however is researching how she will move all of her belongings.   Plan:  This Education officer, museum will follow up with patient after her appointment with the psychiatrist.    Sheralyn Boatman [facility] Care Management [phone]

## 2015-12-09 DIAGNOSIS — F3342 Major depressive disorder, recurrent, in full remission: Secondary | ICD-10-CM | POA: Diagnosis not present

## 2015-12-12 ENCOUNTER — Ambulatory Visit: Payer: Self-pay | Admitting: *Deleted

## 2015-12-13 ENCOUNTER — Other Ambulatory Visit: Payer: Self-pay | Admitting: *Deleted

## 2015-12-13 NOTE — Patient Outreach (Signed)
[place] [facility]) Care Management  [facility] Social Work  12/13/2015  ELAJAH KUNSMAN 04-15-1941 [id number]  Subjective:    Objective:   Encounter Medications:  Outpatient Encounter Prescriptions as of 12/13/2015  Medication Sig Note  . clobetasol ointment (TEMOVATE) 4.09 % Apply 1 application topically 2 (two) times daily. Apply to arms and shins.   . diphenhydrAMINE (BENADRYL) 25 mg capsule Take 25 mg by mouth 2 (two) times daily as needed. Reported on 12/08/2015   . doxepin (SINEQUAN) 25 MG capsule Take 1 capsule (25 mg total) by mouth at bedtime.   . halobetasol (ULTRAVATE) 0.05 % ointment Apply 1 application topically 2 (two) times daily.   Marland Kitchen levETIRAcetam (KEPPRA) 500 MG tablet Take 1 tablet (500 mg total) by mouth 2 (two) times daily.   . ondansetron (ZOFRAN) 4 MG tablet TAKE 1 TABLET (4 MG TOTAL) BY MOUTH EVERY 8 (EIGHT) HOURS AS NEEDED FOR NAUSEA OR VOMITING.   Marland Kitchen oxymetazoline (AFRIN) 0.05 % nasal spray Place 1 spray into both nostrils 2 (two) times daily.   . QUEtiapine (SEROQUEL) 400 MG tablet Take 400 mg by mouth at bedtime. Reported on 09/26/2015 12/08/2015: Psychiatrist filled on 12/06/15  Dr. Casimiro Needle  . rOPINIRole (REQUIP) 1 MG tablet TAKE 1 TABLET EVERY MORNING AND 2 TABLETS EVERY NIGHT PATIENT NEEDS APPOINTMENT 09/26/2015: Per primary care doctor patient needs to see a psychiatrist to manage this medication  . triamcinolone ointment (KENALOG) 0.5 % Apply 1 application topically 2 (two) times daily.   . Armodafinil (NUVIGIL) 250 MG tablet Take 250 mg by mouth daily. Reported on 12/13/2015 09/05/2015: rx refilled   . buPROPion (WELLBUTRIN XL) 150 MG 24 hr tablet Take 1 tablet (150 mg total) by mouth daily. (Patient not taking: Reported on 12/13/2015)   . divalproex (DEPAKOTE) 250 MG DR tablet Take 1 tablet (250 mg total) by mouth every 8 (eight) hours. (Patient not taking: Reported on 12/08/2015)   . doxycycline (VIBRA-TABS) 100 MG tablet Take 1 tablet (100 mg total) by  mouth 2 (two) times daily. (Patient not taking: Reported on 09/05/2015)   . montelukast (SINGULAIR) 10 MG tablet Take 1 tablet (10 mg total) by mouth daily. (Patient not taking: Reported on 12/08/2015) 08/08/2015: Pt did not renew, did not feel needed it, MD aware   . naproxen (NAPROSYN) 500 MG tablet Take 1 tablet (500 mg total) by mouth 2 (two) times daily with a meal. (Patient not taking: Reported on 08/08/2015)   . predniSONE (DELTASONE) 10 MG tablet Take 3 tabs daily x 2 days, then 2 tabs daily for 2 days, then 1 tab daily for 2 days (3, 3, 2, 2, 1, 1.) (Patient not taking: Reported on 08/08/2015) 08/08/2015: Completed    No facility-administered encounter medications on file as of 12/13/2015.    Functional Status:  In your present state of health, do you have any difficulty performing the following activities: 08/08/2015 06/03/2015  Hearing? N N  Vision? N N  Difficulty concentrating or making decisions? Y N  Walking or climbing stairs? Y N  Dressing or bathing? N N  Doing errands, shopping? Y N  Preparing Food and eating ? Y -  Using the Toilet? N -  In the past six months, have you accidently leaked urine? Y -  Do you have problems with loss of bowel control? N -  Managing your Medications? Y -  Managing your Finances? Y -  Housekeeping or managing your Housekeeping? Y -    Fall/Depression Screening:  [facility] 2/9  Scores 08/03/2015 06/03/2015 04/04/2015  PHQ - 2 Score 2 0 -  PHQ- 9 Score 11 - -  Exception Documentation - - Medical reason    Assessment:    This Education officer, museum met with patient in her home.  Patient states that she was able to keep her appointment with her Psychiatrist on 12/16/15, however did not agree with the changes he made to her medications and does not plan to return.  She did, however receive a refill on her Seroquel and was told to return in 2 months. Patient reports also being given some names of outpatient therapist to see a well but has not called to make an  appointment yet.  Patient discussed that her long term plan is to move to [place] with her son but would like to be more stable mentally before the move occurs. Phone call made to patient's son in [place] with the permission of patient to discuss potential move.  Spoke with her son Lennette Bihari, [phone] who confirms plan for patient to move to [place], where she could be closer to family.  However, patient cannot reside with him due to lack of space.  ALF recommended.  Patient and son agree.  [name] will contact "[facility]" to assist with the process of identifying facility care for patient in [place].  However, no definite plans have been put in place yet.  Patient and son remain in early discussions regarding move.  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  "I am too ashamed".  Creating support around this strongly emphasized, list of local meetings as well as outpatient and inpatient treatment facilities provided to patient.  Patient agrees that she needs treatment, however reports having a hard time making th commitment to contact referrals provided. Patient agrees to contact Alcohol Anonymous within 1 week.  Patient's mood labile during visit, thoughts disorganized, tended to move from topic to topic, denies suicide or homicide ideation.     Plan:  This Education officer, museum will follow up with patient in 1 week.             Patient agrees to attend 1 Alcohol Anonymous Meeting within 1 week.

## 2015-12-16 ENCOUNTER — Other Ambulatory Visit: Payer: Self-pay | Admitting: *Deleted

## 2015-12-16 NOTE — Patient Outreach (Addendum)
[place] [facility]) Care Management  12/16/2015  PHILLICIA DELAROCA Apr 04, 1941 [id number]   Transportation arranged for patient to get to her therapist appointment on 12/19/15 at 4 pm.     Sheralyn Boatman [facility] Care Management [phone]

## 2015-12-19 DIAGNOSIS — F3342 Major depressive disorder, recurrent, in full remission: Secondary | ICD-10-CM | POA: Diagnosis not present

## 2015-12-20 ENCOUNTER — Other Ambulatory Visit: Payer: Self-pay | Admitting: *Deleted

## 2015-12-20 NOTE — Patient Outreach (Addendum)
[place] [facility]) Care Management  12/20/2015  Paula Frank 1941/02/04 [id number]   Follow up phone call to patient. Per patient she attended her therapy appointment and states that she had a good session.  My appointmate provided the transportation and the aid remained in the room during the session.  Patient was very upset by this and  unsure why she needed to remain the room. Despite this, she plans to return to therapy.  This Education officer, museum apologized for this and ensured her that the aid that provided the transportation does not need to stay in the room with her.  Patient was also frustrated that she forgot to discuss her medication with the therapist. Patient continues with her plan to move to [place] with her son, however there has been no confirmed date.  Where she will live has not been identified.  Per patient, her son has been researching facilities but has not found one that she could afford yet.  Patient very tearful, anxious, and short tempered during the call.  Patient has not contacted Alcohol Anonymous.  However despite this,  patient was praised for keeping her appointment with the therapist and the fact that she is willing to return.   Plan:  Continued emphasis on remaining active in mental health treatment            This social worker will follow up with patient's son regarding move update.   Sheralyn Boatman [facility] Care Management [phone]

## 2015-12-20 NOTE — Telephone Encounter (Signed)
Error

## 2015-12-29 ENCOUNTER — Other Ambulatory Visit: Payer: Self-pay | Admitting: *Deleted

## 2015-12-29 NOTE — Patient Outreach (Signed)
[place] [facility]) Care Management  12/29/2015  Paula Frank 11/11/1940 [id number]  Phone call to patient. Patient tearful, states that she will not move to [place] with her son. "It is too expensive"  Patient now wants to move to [facility] and is requesting assistance.  Assisted living recommended, however patient refuses this option. Patient would like to find her own apartment, within the city limits of [place].  Patient has an appointment with her therapist at [place] on 01/02/16 at 46 am.  Patient's roommate [name] has agreed to transport her there.  Patient now active with [facility] and [place].  Patient no longer has plans to move to [place] with her son, she refuses option for an Assisted Living. She now wants her own apartment. Patient continues to consider calling Alcohol Anonymous, however does not feel stable enough and fears that they may recommend inpatient.  Per patient , she currently drinks approximately 4-5  glasses of wine per day.   Plan:  CSW to provide patient with community resources to assist with her move.            Home visit scheduled for 01/02/16 at 8:30am.  Case closure to be discussed at this time.    Sheralyn Boatman [facility] Care Management [phone]

## 2016-01-01 ENCOUNTER — Other Ambulatory Visit (HOSPITAL_COMMUNITY): Payer: Self-pay | Admitting: Psychiatry

## 2016-01-02 ENCOUNTER — Other Ambulatory Visit: Payer: Self-pay | Admitting: *Deleted

## 2016-01-02 NOTE — Patient Outreach (Addendum)
[place] [facility]) Care Management  [facility] Social Work  01/02/2016  INAS KORHONEN [birth year] [id number]  Subjective:    Objective:   Encounter Medications:  Outpatient Encounter Prescriptions as of 01/02/2016  Medication Sig Note  . Armodafinil (NUVIGIL) 250 MG tablet Take 250 mg by mouth daily. Reported on 12/29/2015 12/29/2015: Patient not taking, MD will no re-fill  . buPROPion (WELLBUTRIN XL) 150 MG 24 hr tablet Take 1 tablet (150 mg total) by mouth daily. (Patient not taking: Reported on 12/13/2015)   . clobetasol ointment (TEMOVATE) [id number] % Apply 1 application topically 2 (two) times daily. Apply to arms and shins.   . diphenhydrAMINE (BENADRYL) 25 mg capsule Take 25 mg by mouth 2 (two) times daily as needed. Reported on 12/08/2015   . divalproex (DEPAKOTE) 250 MG DR tablet Take 1 tablet (250 mg total) by mouth every 8 (eight) hours. (Patient not taking: Reported on 12/08/2015)   . doxepin (SINEQUAN) 25 MG capsule Take 1 capsule (25 mg total) by mouth at bedtime.   Marland Kitchen doxycycline (VIBRA-TABS) 100 MG tablet Take 1 tablet (100 mg total) by mouth 2 (two) times daily. (Patient not taking: Reported on 09/05/2015)   . halobetasol (ULTRAVATE) 0.05 % ointment Apply 1 application topically 2 (two) times daily.   Marland Kitchen levETIRAcetam (KEPPRA) 500 MG tablet Take 1 tablet (500 mg total) by mouth 2 (two) times daily.   . montelukast (SINGULAIR) 10 MG tablet Take 1 tablet (10 mg total) by mouth daily. (Patient not taking: Reported on 12/08/2015) 08/08/2015: Pt did not renew, did not feel needed it, MD aware   . naproxen (NAPROSYN) 500 MG tablet Take 1 tablet (500 mg total) by mouth 2 (two) times daily with a meal. (Patient not taking: Reported on 08/08/2015)   . ondansetron (ZOFRAN) 4 MG tablet TAKE 1 TABLET (4 MG TOTAL) BY MOUTH EVERY 8 (EIGHT) HOURS AS NEEDED FOR NAUSEA OR VOMITING.   Marland Kitchen oxymetazoline (AFRIN) 0.05 % nasal spray Place 1 spray into both nostrils 2 (two) times daily.   . predniSONE  (DELTASONE) 10 MG tablet Take 3 tabs daily x 2 days, then 2 tabs daily for 2 days, then 1 tab daily for 2 days (3, 3, 2, 2, 1, 1.) (Patient not taking: Reported on 08/08/2015) 08/08/2015: Completed   . QUEtiapine (SEROQUEL) 400 MG tablet Take 400 mg by mouth at bedtime. Reported on 12/29/2015 12/08/2015: Psychiatrist filled on 12/06/15  Dr. Casimiro Needle  . rOPINIRole (REQUIP) 1 MG tablet TAKE 1 TABLET EVERY MORNING AND 2 TABLETS EVERY NIGHT PATIENT NEEDS APPOINTMENT 12/29/2015: Error patient is taking, does not need a psychiatrist to refill  . triamcinolone ointment (KENALOG) 0.5 % Apply 1 application topically 2 (two) times daily.    No facility-administered encounter medications on file as of 01/02/2016.    Functional Status:  In your present state of health, do you have any difficulty performing the following activities: 08/08/2015 06/03/2015  Hearing? N N  Vision? N N  Difficulty concentrating or making decisions? Y N  Walking or climbing stairs? Y N  Dressing or bathing? N N  Doing errands, shopping? Y N  Preparing Food and eating ? Y -  Using the Toilet? N -  In the past six months, have you accidently leaked urine? Y -  Do you have problems with loss of bowel control? N -  Managing your Medications? Y -  Managing your Finances? Y -  Housekeeping or managing your Housekeeping? Y -    Fall/Depression Screening:  PHQ  2/9 Scores 08/03/2015 06/03/2015 04/04/2015  PHQ - 2 Score 2 0 -  PHQ- 9 Score 11 - -  Exception Documentation - - Medical reason    Assessment:  This social worker assisted patient with getting to her scheduled appointment with her therapist at [place] on 01/02/16. Marland Kitchen  Patient's roommate transported patient, by following this social worker to ensure that he knew the route ands could take her to follow up appointments on her own.  Arrived to therapy appointment to find that patient had no appointment.  With further investigation it was found that she had  cancelled the appointment a few days prior after speaking to the therapist 3 different times.  Per patient, she does not remember doing this and states that she no longer wants to return to this practice.  This Education officer, museum discussed the importance of follow up care with a mental health provider and the difficulty in assisting her if she does not communicate prior arrangements with this Education officer, museum.  Patient dicussed finding a new therapist, having one in mind that she saw in the past.  This social worker explained that her therapist choice is her her choice.  Patient continues to report alcohol use.  This Education officer, museum previously provided patient with a  list of local Alcohol Anonymous meetings as well as outpatient and inpatient treatment facilities for further assistance. Patient agrees that she needs treatment, agrees that Alcohol Anonymous would be beneficial, however continues to refuse to call.  Patient also discussed being unhappy in her relationship and would like to move.  Patient refuses Assisted Living , would like to move to [place], however has no solid plans to carry out move.  It was suggested that patient contact her son in [place] to assist with this.  Patient also has a neighbor who she is close to that she could possibly call.   Patient states that she is not be physically abused, however they constantly argue.  [facility] if needed.  [phone].  Patient also given the number to [place] [phone] to call if she feels that her safety is in jeopardy.  Plan: All available community resources provided to patient .  Patient's case to be closed at this time.   Sheralyn Boatman [facility] Care Management [phone]

## 2016-01-03 ENCOUNTER — Encounter: Payer: Self-pay | Admitting: *Deleted

## 2016-01-03 ENCOUNTER — Other Ambulatory Visit: Payer: Self-pay | Admitting: Psychiatry

## 2016-01-03 NOTE — Progress Notes (Signed)
This encounter was created in error - please disregard.

## 2016-01-04 ENCOUNTER — Other Ambulatory Visit: Payer: Self-pay | Admitting: *Deleted

## 2016-01-04 NOTE — Patient Outreach (Signed)
[place] [facility]) Care Management  01/04/2016  Paula Frank [birth year] [id number]   Phone call patients son Paula Frank to obtain an update on how patient is doing.  [name], not on the [facility] consent.  Phone call to patient, verbal consent to speak to her son given.  This Education officer, museum discussed community resources offered to patient including assistance with referrals for mental health treatment, substance abuse treatment and assisted living placement, none of which patient has followed through with.  Per patient's son, this  pattern of indecision and lack of follow through is a familiar one. [name]  has tried multiple times to move her where she would have increased support and care for her to back out after plans have been made. Per patient's son due to her alcoholism and refusal to get treatment, she has been estranged from family and friends.  [name] plans to make more of an effort to move patient to [place] where he lives to provide her with more supervision and care.  He agrees that she would need to be in an assisted living and will contact [facility] for assistance with this.  Contact number for this agency previously provided to patient's son .  This Education officer, museum explained that patient's case is closed at this time as patient has been provided with community resources for assistance with facility care, substance abuse treatment and mental health.  However care plan adherence has not been met   Paula Frank [facility] Care Management [phone]

## 2016-01-10 ENCOUNTER — Encounter: Payer: Self-pay | Admitting: *Deleted

## 2016-01-18 ENCOUNTER — Other Ambulatory Visit: Payer: Self-pay | Admitting: Family Medicine

## 2016-01-18 DIAGNOSIS — L509 Urticaria, unspecified: Secondary | ICD-10-CM

## 2016-01-26 ENCOUNTER — Ambulatory Visit (INDEPENDENT_AMBULATORY_CARE_PROVIDER_SITE_OTHER): Payer: Medicare Other | Admitting: Family Medicine

## 2016-01-26 ENCOUNTER — Encounter: Payer: Self-pay | Admitting: Family Medicine

## 2016-01-26 VITALS — BP 103/64 | HR 103 | Temp 98.8°F | Resp 16 | Ht 66.0 in | Wt 134.0 lb

## 2016-01-26 DIAGNOSIS — R21 Rash and other nonspecific skin eruption: Secondary | ICD-10-CM

## 2016-01-26 DIAGNOSIS — R1907 Generalized intra-abdominal and pelvic swelling, mass and lump: Secondary | ICD-10-CM

## 2016-01-26 LAB — CBC WITH DIFFERENTIAL/PLATELET
Basophils Absolute: 0 cells/uL (ref 0–200)
Basophils Relative: 0 %
EOS ABS: 144 {cells}/uL (ref 15–500)
Eosinophils Relative: 2 %
HEMATOCRIT: 36.5 % (ref 35.0–45.0)
Hemoglobin: 11.9 g/dL (ref 11.7–15.5)
LYMPHS PCT: 28 %
Lymphs Abs: 2016 cells/uL (ref 850–3900)
MCH: 32.8 pg (ref 27.0–33.0)
MCHC: 32.6 g/dL (ref 32.0–36.0)
MCV: 100.6 fL — AB (ref 80.0–100.0)
MONO ABS: 576 {cells}/uL (ref 200–950)
MONOS PCT: 8 %
MPV: 8.5 fL (ref 7.5–12.5)
Neutro Abs: 4464 cells/uL (ref 1500–7800)
Neutrophils Relative %: 62 %
Platelets: 390 10*3/uL (ref 140–400)
RBC: 3.63 MIL/uL — ABNORMAL LOW (ref 3.80–5.10)
RDW: 14.8 % (ref 11.0–15.0)
WBC: 7.2 10*3/uL (ref 3.8–10.8)

## 2016-01-26 MED ORDER — TRIAMCINOLONE ACETONIDE 0.5 % EX OINT: 1.0000 "application " | TOPICAL_OINTMENT | Freq: Two times a day (BID) | CUTANEOUS | 2 refills | Status: DC

## 2016-01-26 MED ORDER — DOXYCYCLINE HYCLATE 100 MG PO TABS: 100.0000 mg | ORAL_TABLET | Freq: Two times a day (BID) | ORAL | 0 refills | Status: DC

## 2016-01-26 NOTE — Progress Notes (Signed)
Name: Paula Frank   MRN: [id number]    DOB: 1941-05-21   Date:01/26/2016       Progress Note  Subjective  Chief Complaint  Chief Complaint  Patient presents with  . abdominal discomfort    HPI Here for c/o abdominal swelling and discomfort.  Also with some leg swelling.  Her eczema with itching has returned.  R>L.    No problem-specific Assessment & Plan notes found for this encounter.   Past Medical History:  Diagnosis Date  . Anxiety   . Chronic kidney disease   . Chronic pain   . Discoid lupus   . Diverticulitis   . Gastritis   . Lupus (systemic lupus erythematosus) ([place])   . Restless leg   . Seizures ([place])     Past Surgical History:  Procedure Laterality Date  . ABDOMINAL HYSTERECTOMY    . APPENDECTOMY    . KNEE ARTHROSCOPY W/ MENISCECTOMY    . LAPAROSCOPY    . LITHOTRIPSY    . ROTATOR CUFF REPAIR    . ROTATOR CUFF REPAIR Bilateral    x2  . TUBAL LIGATION      Family History  Problem Relation Age of Onset  . Cancer Son   . Cancer Mother   . Esophageal cancer Father   . Breast cancer Sister     Social History   Social History  . Marital status: Widowed    Spouse name: N/A  . Number of children: 3  . Years of education: N/A   Occupational History  . retired Therapist, sports    Social History Main Topics  . Smoking status: Current Every Day Smoker    Packs/day: 1.00    Years: 60.00    Types: Cigarettes  . Smokeless tobacco: Never Used  . Alcohol use 4.2 - 8.4 oz/week    7 - 14 Standard drinks or equivalent per week     Comment: 2 glasses of wine a day  . Drug use: No  . Sexual activity: Not on file   Other Topics Concern  . Not on file   Social History Narrative   Lives with friend.   She retired Therapist, sports and worked in psychiatry.           Current Outpatient Prescriptions:  .  buPROPion (WELLBUTRIN XL) 150 MG 24 hr tablet, Take 1 tablet (150 mg total) by mouth daily., Disp: 30 tablet, Rfl: 0 .  clobetasol ointment (TEMOVATE) 5.99 %, Apply 1  application topically 2 (two) times daily. Apply to arms and shins., Disp: , Rfl:  .  diphenhydrAMINE (BENADRYL) 25 mg capsule, Take 25 mg by mouth 2 (two) times daily as needed. Reported on 12/08/2015, Disp: , Rfl:  .  divalproex (DEPAKOTE) 250 MG DR tablet, Take 1 tablet (250 mg total) by mouth every 8 (eight) hours., Disp: 90 tablet, Rfl: 0 .  doxepin (SINEQUAN) 25 MG capsule, TAKE 1 CAPSULE BY MOUTH AT BEDTIME, Disp: 30 capsule, Rfl: 0 .  halobetasol (ULTRAVATE) 0.05 % ointment, Apply 1 application topically 2 (two) times daily., Disp: , Rfl: 5 .  levETIRAcetam (KEPPRA) 500 MG tablet, Take 1 tablet (500 mg total) by mouth 2 (two) times daily., Disp: 60 tablet, Rfl: 0 .  montelukast (SINGULAIR) 10 MG tablet, TAKE 1 TABLET (10 MG TOTAL) BY MOUTH DAILY., Disp: 30 tablet, Rfl: 6 .  naproxen (NAPROSYN) 500 MG tablet, Take 1 tablet (500 mg total) by mouth 2 (two) times daily with a meal., Disp: 20 tablet, Rfl: 0 .  ondansetron (ZOFRAN) 4 MG tablet, TAKE 1 TABLET (4 MG TOTAL) BY MOUTH EVERY 8 (EIGHT) HOURS AS NEEDED FOR NAUSEA OR VOMITING., Disp: 30 tablet, Rfl: 3 .  oxymetazoline (AFRIN) 0.05 % nasal spray, Place 1 spray into both nostrils 2 (two) times daily., Disp: , Rfl:  .  QUEtiapine (SEROQUEL) 400 MG tablet, Take 400 mg by mouth at bedtime. Reported on 12/29/2015, Disp: , Rfl:  .  rOPINIRole (REQUIP) 1 MG tablet, TAKE 1 TABLET EVERY MORNING AND 2 TABLETS EVERY NIGHT PATIENT NEEDS APPOINTMENT, Disp: 90 tablet, Rfl: 0 .  triamcinolone ointment (KENALOG) 0.5 %, Apply 1 application topically 2 (two) times daily., Disp: 60 g, Rfl: 2 .  Armodafinil (NUVIGIL) 250 MG tablet, Take 250 mg by mouth daily. Reported on 12/29/2015, Disp: , Rfl:  .  doxycycline (VIBRA-TABS) 100 MG tablet, Take 1 tablet (100 mg total) by mouth 2 (two) times daily., Disp: 20 tablet, Rfl: 0  Allergies  Allergen Reactions  . Amoxicillin Other (See Comments)  . Ioxaglate     Other reaction(s): Unknown  . Ivp Dye [Iodinated  Diagnostic Agents]     Other reaction(s): Unknown  . Levaquin [Levofloxacin In D5w]   . Levofloxacin     Other reaction(s): Unknown  . Moxifloxacin Hcl In Nacl     Other reaction(s): Unknown  . Erythromycin Rash    Canker sore  . Sulfa Antibiotics Rash    Other reaction(s): Unknown     Review of Systems  Constitutional: Negative for chills, fever, malaise/fatigue and weight loss.  HENT: Negative for hearing loss.   Eyes: Negative for blurred vision and double vision.  Respiratory: Positive for cough, sputum production (white sputum) and wheezing. Negative for shortness of breath.   Cardiovascular: Positive for leg swelling. Negative for chest pain and palpitations.  Gastrointestinal: Positive for abdominal pain (discomfort and swelling). Negative for blood in stool, constipation, diarrhea, heartburn, nausea and vomiting.  Genitourinary: Negative for dysuria, frequency and urgency.  Skin: Positive for itching and rash.  Neurological: Negative for dizziness, tremors, weakness and headaches.  Psychiatric/Behavioral: Positive for depression. The patient is nervous/anxious.       Objective  Vitals:   01/26/16 1440  BP: 103/64  Pulse: (!) 103  Resp: 16  Temp: 98.8 F (37.1 C)  TempSrc: Oral  Weight: 134 lb (60.8 kg)  Height: 5' 6"  (1.676 m)    Physical Exam  Constitutional: She is oriented to person, place, and time and well-developed, well-nourished, and in no distress. No distress.  HENT:  Head: Normocephalic and atraumatic.  Neck: Normal range of motion. Neck supple. Carotid bruit is not present. No thyromegaly present.  Cardiovascular: Regular rhythm and normal heart sounds.  Tachycardia present.  Exam reveals no gallop and no friction rub.   No murmur heard. Pulmonary/Chest: Effort normal and breath sounds normal. No respiratory distress. She has no wheezes. She has no rales.  Abdominal: Soft. Bowel sounds are normal. She exhibits distension. She exhibits no mass.  There is no tenderness. There is no rebound and no guarding.  Musculoskeletal: She exhibits edema (Trace bilateral pedal edema.).  Lymphadenopathy:    She has no cervical adenopathy.  Neurological: She is alert and oriented to person, place, and time.  Skin:  Excoriations and redness of bilateral lower legs, R>L.  Vitals reviewed.      Recent Results (from the past 2160 hour(s))  CBC with Differential/Platelet     Status: Abnormal   Collection Time: 10/28/15  3:39 PM  Result Value Ref  Range   WBC 3.9 3.6 - 11.0 K/uL   RBC 3.81 3.80 - 5.20 MIL/uL   Hemoglobin 12.5 12.0 - 16.0 g/dL   HCT 37.8 35.0 - 47.0 %   MCV 99.0 80.0 - 100.0 fL   MCH 32.8 26.0 - 34.0 pg   MCHC 33.1 32.0 - 36.0 g/dL   RDW 14.3 11.5 - 14.5 %   Platelets 533 (H) 150 - 440 K/uL   Neutrophils Relative % 59 %   Neutro Abs 2.3 1.4 - 6.5 K/uL   Lymphocytes Relative 26 %   Lymphs Abs 1.0 1.0 - 3.6 K/uL   Monocytes Relative 12 %   Monocytes Absolute 0.4 0.2 - 0.9 K/uL   Eosinophils Relative 2 %   Eosinophils Absolute 0.1 0 - 0.7 K/uL   Basophils Relative 1 %   Basophils Absolute 0.0 0 - 0.1 K/uL  Comprehensive metabolic panel     Status: Abnormal   Collection Time: 10/28/15  3:39 PM  Result Value Ref Range   Sodium 135 135 - 145 mmol/L   Potassium 4.1 3.5 - 5.1 mmol/L   Chloride 102 101 - 111 mmol/L   CO2 21 (L) 22 - 32 mmol/L   Glucose, Bld 87 65 - 99 mg/dL   BUN 17 6 - 20 mg/dL   Creatinine, Ser 0.85 0.44 - 1.00 mg/dL   Calcium 9.1 8.9 - 10.3 mg/dL   Total Protein 7.1 6.5 - 8.1 g/dL   Albumin 3.4 (L) 3.5 - 5.0 g/dL   AST 18 15 - 41 U/L   ALT 13 (L) 14 - 54 U/L   Alkaline Phosphatase 103 38 - 126 U/L   Total Bilirubin 0.3 0.3 - 1.2 mg/dL   GFR calc non Af Amer >60 >60 mL/min   GFR calc Af Amer >60 >60 mL/min    Comment: (NOTE) The eGFR has been calculated using the CKD EPI equation. This calculation has not been validated in all clinical situations. eGFR's persistently <60 mL/min signify possible  Chronic Kidney Disease.    Anion gap 12 5 - 15  Troponin I     Status: None   Collection Time: 10/28/15  3:39 PM  Result Value Ref Range   Troponin I <0.03 <0.031 ng/mL    Comment:        NO INDICATION OF MYOCARDIAL INJURY.   Urinalysis complete, with microscopic ([facility] only)     Status: Abnormal   Collection Time: 10/29/15  5:13 AM  Result Value Ref Range   Color, Urine YELLOW (A) YELLOW   APPearance CLEAR (A) CLEAR   Glucose, UA NEGATIVE NEGATIVE mg/dL   Bilirubin Urine NEGATIVE NEGATIVE   Ketones, ur TRACE (A) NEGATIVE mg/dL   Specific Gravity, Urine 1.015 1.005 - 1.030   Hgb urine dipstick NEGATIVE NEGATIVE   pH 5.0 5.0 - 8.0   Protein, ur NEGATIVE NEGATIVE mg/dL   Nitrite NEGATIVE NEGATIVE   Leukocytes, UA NEGATIVE NEGATIVE   RBC / HPF 0-5 0 - 5 RBC/hpf   WBC, UA 0-5 0 - 5 WBC/hpf   Bacteria, UA RARE (A) NONE SEEN   Squamous Epithelial / LPF 0-5 (A) NONE SEEN   Mucous PRESENT    Hyaline Casts, UA PRESENT   Valproic acid level     Status: Abnormal   Collection Time: 10/30/15  2:21 PM  Result Value Ref Range   Valproic Acid Lvl 30 (L) 50.0 - 100.0 ug/mL  Ammonia     Status: None   Collection Time: 10/30/15  2:21 PM  Result Value Ref Range   Ammonia 19 9 - 35 umol/L  Comprehensive metabolic panel     Status: Abnormal   Collection Time: 11/21/15 10:08 AM  Result Value Ref Range   Sodium 141 135 - 145 mmol/L   Potassium 4.1 3.5 - 5.1 mmol/L   Chloride 109 101 - 111 mmol/L   CO2 26 22 - 32 mmol/L   Glucose, Bld 96 65 - 99 mg/dL   BUN 21 (H) 6 - 20 mg/dL   Creatinine, Ser 0.79 0.44 - 1.00 mg/dL   Calcium 9.2 8.9 - 10.3 mg/dL   Total Protein 7.1 6.5 - 8.1 g/dL   Albumin 3.6 3.5 - 5.0 g/dL   AST 21 15 - 41 U/L   ALT 14 14 - 54 U/L   Alkaline Phosphatase 102 38 - 126 U/L   Total Bilirubin 0.4 0.3 - 1.2 mg/dL   GFR calc non Af Amer >60 >60 mL/min   GFR calc Af Amer >60 >60 mL/min    Comment: (NOTE) The eGFR has been calculated using the CKD EPI equation. This  calculation has not been validated in all clinical situations. eGFR's persistently <60 mL/min signify possible Chronic Kidney Disease.    Anion gap 6 5 - 15  CBC with Differential/Platelet     Status: Abnormal   Collection Time: 11/21/15 10:08 AM  Result Value Ref Range   WBC 5.9 3.6 - 11.0 K/uL   RBC 3.77 (L) 3.80 - 5.20 MIL/uL   Hemoglobin 12.3 12.0 - 16.0 g/dL   HCT 37.3 35.0 - 47.0 %   MCV 98.7 80.0 - 100.0 fL   MCH 32.6 26.0 - 34.0 pg   MCHC 33.0 32.0 - 36.0 g/dL   RDW 14.9 (H) 11.5 - 14.5 %   Platelets 366 150 - 440 K/uL   Neutrophils Relative % 57 %   Neutro Abs 3.4 1.4 - 6.5 K/uL   Lymphocytes Relative 29 %   Lymphs Abs 1.7 1.0 - 3.6 K/uL   Monocytes Relative 11 %   Monocytes Absolute 0.7 0.2 - 0.9 K/uL   Eosinophils Relative 2 %   Eosinophils Absolute 0.1 0 - 0.7 K/uL   Basophils Relative 1 %   Basophils Absolute 0.0 0 - 0.1 K/uL  Ammonia     Status: None   Collection Time: 11/21/15 10:23 AM  Result Value Ref Range   Ammonia 17 9 - 35 umol/L  Urinalysis complete, with microscopic ([facility] only)     Status: Abnormal   Collection Time: 11/21/15 12:57 PM  Result Value Ref Range   Color, Urine YELLOW (A) YELLOW   APPearance CLEAR (A) CLEAR   Glucose, UA NEGATIVE NEGATIVE mg/dL   Bilirubin Urine NEGATIVE NEGATIVE   Ketones, ur NEGATIVE NEGATIVE mg/dL   Specific Gravity, Urine 1.015 1.005 - 1.030   Hgb urine dipstick NEGATIVE NEGATIVE   pH 5.0 5.0 - 8.0   Protein, ur NEGATIVE NEGATIVE mg/dL   Nitrite NEGATIVE NEGATIVE   Leukocytes, UA 2+ (A) NEGATIVE   RBC / HPF 0-5 0 - 5 RBC/hpf   WBC, UA 6-30 0 - 5 WBC/hpf   Bacteria, UA RARE (A) NONE SEEN   Squamous Epithelial / LPF 6-30 (A) NONE SEEN   Mucous PRESENT   Urine culture     Status: Abnormal   Collection Time: 11/21/15 12:57 PM  Result Value Ref Range   Specimen Description URINE, CLEAN CATCH    Special Requests NONE    Culture MULTIPLE SPECIES  PRESENT, SUGGEST RECOLLECTION (A)    Report Status 11/22/2015 FINAL       Assessment & Plan  Problem List Items Addressed This Visit      Musculoskeletal and Integument   Rash - Primary   Relevant Medications   triamcinolone ointment (KENALOG) 0.5 %   doxycycline (VIBRA-TABS) 100 MG tablet     Other   Abdominal swelling, generalized   Relevant Orders   CBC with Differential   COMPLETE METABOLIC PANEL WITH GFR   CT Abdomen Pelvis Wo Contrast    Other Visit Diagnoses   None.     Meds ordered this encounter  Medications  . triamcinolone ointment (KENALOG) 0.5 %    Sig: Apply 1 application topically 2 (two) times daily.    Dispense:  60 g    Refill:  2  . doxycycline (VIBRA-TABS) 100 MG tablet    Sig: Take 1 tablet (100 mg total) by mouth 2 (two) times daily.    Dispense:  20 tablet    Refill:  0   1. Abdominal swelling, generalized  - CBC with Differential - COMPLETE METABOLIC PANEL WITH GFR - CT Abdomen Pelvis Wo Contrast; Future  2. Rash  - triamcinolone ointment (KENALOG) 0.5 %; Apply 1 application topically 2 (two) times daily.  Dispense: 60 g; Refill: 2 - doxycycline (VIBRA-TABS) 100 MG tablet; Take 1 tablet (100 mg total) by mouth 2 (two) times daily.  Dispense: 20 tablet; Refill: 0

## 2016-01-27 LAB — COMPLETE METABOLIC PANEL WITH GFR
ALT: 15 U/L (ref 6–29)
AST: 20 U/L (ref 10–35)
Albumin: 3.6 g/dL (ref 3.6–5.1)
Alkaline Phosphatase: 129 U/L (ref 33–130)
BUN: 17 mg/dL (ref 7–25)
CALCIUM: 9.2 mg/dL (ref 8.6–10.4)
CHLORIDE: 111 mmol/L — AB (ref 98–110)
CO2: 22 mmol/L (ref 20–31)
Creat: 0.79 mg/dL (ref 0.60–0.93)
GFR, EST AFRICAN AMERICAN: 85 mL/min (ref >=60)
GFR, EST NON AFRICAN AMERICAN: 73 mL/min (ref >=60)
Glucose, Bld: 68 mg/dL (ref 65–99)
POTASSIUM: 5.6 mmol/L — AB (ref 3.5–5.3)
Sodium: 141 mmol/L (ref 135–146)
Total Bilirubin: 0.2 mg/dL (ref 0.2–1.2)
Total Protein: 6.8 g/dL (ref 6.1–8.1)

## 2016-01-28 ENCOUNTER — Emergency Department
Admission: EM | Admit: 2016-01-28 | Discharge: 2016-01-28 | Disposition: A | Discharge status: Home / Self Care | Payer: Medicare Other | Attending: Emergency Medicine | Admitting: Emergency Medicine

## 2016-01-28 ENCOUNTER — Encounter: Payer: Self-pay | Admitting: Emergency Medicine

## 2016-01-28 DIAGNOSIS — X58XXXA Exposure to other specified factors, initial encounter: Secondary | ICD-10-CM | POA: Insufficient documentation

## 2016-01-28 DIAGNOSIS — N189 Chronic kidney disease, unspecified: Secondary | ICD-10-CM | POA: Diagnosis not present

## 2016-01-28 DIAGNOSIS — R2232 Localized swelling, mass and lump, left upper limb: Secondary | ICD-10-CM | POA: Diagnosis present

## 2016-01-28 DIAGNOSIS — Y999 Unspecified external cause status: Secondary | ICD-10-CM | POA: Insufficient documentation

## 2016-01-28 DIAGNOSIS — Y929 Unspecified place or not applicable: Secondary | ICD-10-CM | POA: Diagnosis not present

## 2016-01-28 DIAGNOSIS — W4904XA Ring or other jewelry causing external constriction, initial encounter: Secondary | ICD-10-CM | POA: Insufficient documentation

## 2016-01-28 DIAGNOSIS — F17210 Nicotine dependence, cigarettes, uncomplicated: Secondary | ICD-10-CM | POA: Diagnosis not present

## 2016-01-28 DIAGNOSIS — Y939 Activity, unspecified: Secondary | ICD-10-CM | POA: Diagnosis not present

## 2016-01-28 DIAGNOSIS — S60445A External constriction of left ring finger, initial encounter: Secondary | ICD-10-CM | POA: Diagnosis not present

## 2016-01-28 NOTE — ED Triage Notes (Signed)
Patient presents to the ED with swelling to her ring finger of her left hand.  Patient states, "I need to have my ring cut off, the jeweler won't do it."  Patient is scheduled for surgery soon and needs the ring off for the surgery.

## 2016-01-28 NOTE — ED Provider Notes (Signed)
[facility] Emergency Department Provider Note   ____________________________________________    I have reviewed the triage vital signs and the nursing notes.   HISTORY  Chief Complaint Joint Swelling     HPI Paula Frank is a 75 y.o. female who presents with complaints of inability to remove ring off of her left ring finger. She has had some swelling of her fingers over the years and now she cannot remove her ring. She has no other complaints   Past Medical History:  Diagnosis Date  . Anxiety   . Chronic kidney disease   . Chronic pain   . Discoid lupus   . Diverticulitis   . Gastritis   . Lupus (systemic lupus erythematosus) ([place])   . Restless leg   . Seizures [facility])     Patient Active Problem List   Diagnosis Date Noted  . Abdominal swelling, generalized 01/26/2016  . Bilateral arm weakness 10/12/2015  . Difficulty walking 10/12/2015  . Neck pain 10/12/2015  . Rash 10/12/2015  . Spell of altered consciousness 10/12/2015  . 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 Bipolar I disorder, most recent episode depressed, severe without psychotic features ([place]) 02/16/2015  . Alcohol abuse 02/16/2015  . Alzheimer's dementia 02/16/2015  . 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 Current tobacco use 02/19/2014  . Systemic lupus erythematosus ([place]) 01/14/2014  . Spells ([place]) 12/01/2013  . Seizure ([place]) 10/30/2013  . Disordered sleep 10/30/2013  . Adynamia 10/30/2013  . Blood pressure elevated 05/19/2012  . Fatigue 11/20/2011  . Bipolar 1 disorder ([place]) 10/22/2011    Past Surgical History:  Procedure Laterality Date  . ABDOMINAL HYSTERECTOMY    . APPENDECTOMY    . KNEE ARTHROSCOPY W/ MENISCECTOMY    . LAPAROSCOPY    . LITHOTRIPSY    . ROTATOR CUFF REPAIR    . ROTATOR CUFF REPAIR Bilateral    x2  . TUBAL LIGATION      Prior to Admission medications   Medication Sig Start Date End  Date Taking? Authorizing Provider  Armodafinil (NUVIGIL) 250 MG tablet Take 250 mg by mouth daily. Reported on 12/29/2015    Historical Provider, MD  buPROPion (WELLBUTRIN XL) 150 MG 24 hr tablet Take 1 tablet (150 mg total) by mouth daily. 10/31/15   Gonzella Lex, MD  clobetasol ointment (TEMOVATE) [id number] % Apply 1 application topically 2 (two) times daily. Apply to arms and shins.    Historical Provider, MD  diphenhydrAMINE (BENADRYL) 25 mg capsule Take 25 mg by mouth 2 (two) times daily as needed. Reported on 12/08/2015    Historical Provider, MD  divalproex (DEPAKOTE) 250 MG DR tablet Take 1 tablet (250 mg total) by mouth every 8 (eight) hours. 10/31/15   Gonzella Lex, MD  doxepin (SINEQUAN) 25 MG capsule TAKE 1 CAPSULE BY MOUTH AT BEDTIME 01/04/16   Gonzella Lex, MD  doxycycline (VIBRA-TABS) 100 MG tablet Take 1 tablet (100 mg total) by mouth 2 (two) times daily. 01/26/16   Arlis Porta., MD  halobetasol (ULTRAVATE) 0.05 % ointment Apply 1 application topically 2 (two) times daily. 06/06/15   Historical Provider, MD  levETIRAcetam (KEPPRA) 500 MG tablet Take 1 tablet (500 mg total) by mouth 2 (two) times daily. 10/31/15   Gonzella Lex, MD  montelukast (SINGULAIR) 10 MG tablet TAKE 1 TABLET (10 MG TOTAL) BY MOUTH DAILY. 01/19/16   Arlis Porta., MD  naproxen (NAPROSYN) 500 MG  tablet Take 1 tablet (500 mg total) by mouth 2 (two) times daily with a meal. 06/14/15   Carrie Mew, MD  ondansetron (ZOFRAN) 4 MG tablet TAKE 1 TABLET (4 MG TOTAL) BY MOUTH EVERY 8 (EIGHT) HOURS AS NEEDED FOR NAUSEA OR VOMITING. 10/27/15   Arlis Porta., MD  oxymetazoline (AFRIN) 0.05 % nasal spray Place 1 spray into both nostrils 2 (two) times daily.    Historical Provider, MD  QUEtiapine (SEROQUEL) 400 MG tablet Take 400 mg by mouth at bedtime. Reported on 12/29/2015    Historical Provider, MD  rOPINIRole (REQUIP) 1 MG tablet TAKE 1 TABLET EVERY MORNING AND 2 TABLETS EVERY NIGHT PATIENT NEEDS APPOINTMENT  08/15/15   Arlis Porta., MD  triamcinolone ointment (KENALOG) 0.5 % Apply 1 application topically 2 (two) times daily. 01/26/16   Arlis Porta., MD     Allergies Amoxicillin; Ioxaglate; Ivp dye [iodinated diagnostic agents]; Levaquin [levofloxacin in d5w]; Levofloxacin; Moxifloxacin hcl in nacl; Erythromycin; and Sulfa antibiotics  Family History  Problem Relation Age of Onset  . Cancer Mother   . Esophageal cancer Father   . Cancer Son   . Breast cancer Sister     Social History Social History  Substance Use Topics  . Smoking status: Current Every Day Smoker    Packs/day: 1.00    Years: 60.00    Types: Cigarettes  . Smokeless tobacco: Never Used  . Alcohol use 4.2 - 8.4 oz/week    7 - 14 Standard drinks or equivalent per week     Comment: 2 glasses of wine a day    Review of Systems        Musculoskeletal: Mild swelling of fingers as noted above Skin: Negative for rash. Neurological: Negative for Numbness    ____________________________________________   PHYSICAL EXAM:  VITAL SIGNS: ED Triage Vitals [01/28/16 1247]  Enc Vitals Group     BP (!) 151/88     Pulse Rate (!) 103     Resp 18     Temp 98.5 F (36.9 C)     Temp Source Oral     SpO2 97 %     Weight 134 lb (60.8 kg)     Height 5\' 6"  (1.676 m)     Head Circumference      Peak Flow      Pain Score 0     Pain Loc      Pain Edu?      Excl. in [place]?     Constitutional: Alert and oriented. No acute distress. Pleasant and interactive    Cardiovascular: Normal rate, regular rhythm.  Respiratory: Normal respiratory effort.  No retractions. Genitourinary: deferred Musculoskeletal: Mild swelling of fingers, left ring finger with gold ring at the base unable to remove   Neurologic:  Normal speech and language. No gross focal neurologic deficits are appreciated.   Skin:  Skin is warm, dry and intact. No rash noted.   ____________________________________________   LABS (all labs ordered  are listed, but only abnormal results are displayed)  Labs Reviewed - No data to display ____________________________________________  EKG   ____________________________________________  RADIOLOGY  None ____________________________________________   PROCEDURES  Procedure(s) performed: yes  Ring removed with electric ring cutter, patient tolerated well    Critical Care performed: No ____________________________________________   INITIAL IMPRESSION / ASSESSMENT AND PLAN / ED COURSE  Pertinent labs & imaging results that were available during my care of the patient were reviewed by me and considered  in my medical decision making (see chart for details).  Ring removed, patient has no other complaints, appropriate for discharge   ____________________________________________   FINAL CLINICAL IMPRESSION(S) / ED DIAGNOSES  Final diagnoses:  External constriction of left ring finger, initial encounter      NEW MEDICATIONS STARTED DURING THIS VISIT:  New Prescriptions   No medications on file     Note:  This document was prepared using Dragon voice recognition software and may include unintentional dictation errors.    Lavonia Drafts, MD 01/28/16 [phone]

## 2016-01-28 NOTE — ED Notes (Signed)
Ring stuck on finger - unable to get off. Ring cutter obtained and dr to bedside

## 2016-01-30 ENCOUNTER — Other Ambulatory Visit: Payer: Self-pay

## 2016-01-30 DIAGNOSIS — E875 Hyperkalemia: Secondary | ICD-10-CM

## 2016-01-30 NOTE — Progress Notes (Signed)
Patient notified and will come back on Monday 02/06/16 for BMP.

## 2016-01-31 ENCOUNTER — Other Ambulatory Visit: Payer: Self-pay | Admitting: Psychiatry

## 2016-02-03 ENCOUNTER — Ambulatory Visit
Admission: RE | Admit: 2016-02-03 | Discharge: 2016-02-03 | Disposition: A | Discharge status: Home / Self Care | Payer: Medicare Other | Source: Ambulatory Visit | Attending: Family Medicine | Admitting: Family Medicine

## 2016-02-03 DIAGNOSIS — M47896 Other spondylosis, lumbar region: Secondary | ICD-10-CM | POA: Insufficient documentation

## 2016-02-03 DIAGNOSIS — K5730 Diverticulosis of large intestine without perforation or abscess without bleeding: Secondary | ICD-10-CM | POA: Insufficient documentation

## 2016-02-03 DIAGNOSIS — R1907 Generalized intra-abdominal and pelvic swelling, mass and lump: Secondary | ICD-10-CM | POA: Insufficient documentation

## 2016-02-03 DIAGNOSIS — Z90710 Acquired absence of both cervix and uterus: Secondary | ICD-10-CM | POA: Diagnosis not present

## 2016-02-03 DIAGNOSIS — R109 Unspecified abdominal pain: Secondary | ICD-10-CM | POA: Diagnosis not present

## 2016-02-06 ENCOUNTER — Other Ambulatory Visit: Payer: Self-pay

## 2016-02-07 ENCOUNTER — Other Ambulatory Visit: Payer: Medicare Other

## 2016-02-07 ENCOUNTER — Other Ambulatory Visit: Payer: Self-pay | Admitting: Family Medicine

## 2016-02-07 ENCOUNTER — Telehealth: Payer: Self-pay | Admitting: Family Medicine

## 2016-02-07 DIAGNOSIS — E875 Hyperkalemia: Secondary | ICD-10-CM | POA: Diagnosis not present

## 2016-02-07 DIAGNOSIS — R1907 Generalized intra-abdominal and pelvic swelling, mass and lump: Secondary | ICD-10-CM

## 2016-02-07 DIAGNOSIS — R5383 Other fatigue: Secondary | ICD-10-CM | POA: Diagnosis not present

## 2016-02-07 LAB — BASIC METABOLIC PANEL WITH GFR
BUN: 14 mg/dL (ref 7–25)
CALCIUM: 9.2 mg/dL (ref 8.6–10.4)
CO2: 22 mmol/L (ref 20–31)
CREATININE: 0.8 mg/dL (ref 0.60–0.93)
Chloride: 108 mmol/L (ref 98–110)
GFR, EST AFRICAN AMERICAN: 83 mL/min (ref >=60)
GFR, Est Non African American: 72 mL/min (ref >=60)
GLUCOSE: 90 mg/dL (ref 65–99)
Potassium: 4.4 mmol/L (ref 3.5–5.3)
Sodium: 140 mmol/L (ref 135–146)

## 2016-02-07 NOTE — Telephone Encounter (Signed)
No evidence that her B12 is low.  I can have a B12 level done, but noshot treatment unless documented low level in her body.  Let me know if she wants this extra test done.  Could see if it can be added to her [facility] labs that were done today.-jh

## 2016-02-07 NOTE — Telephone Encounter (Signed)
Pt. wanted to know if she could get B12 shots. Pt  Call  Back # is  [phone]

## 2016-02-08 LAB — VITAMIN B12: VITAMIN B 12: 238 pg/mL (ref 200–1100)

## 2016-02-08 NOTE — Telephone Encounter (Signed)
Vit b-12 has been added. Patient will be contacted after results post.

## 2016-02-09 NOTE — Telephone Encounter (Signed)
Results have been sent to Nurse Fabio Bering to be discussed with patient.

## 2016-02-10 ENCOUNTER — Telehealth: Payer: Self-pay | Admitting: Gastroenterology

## 2016-02-10 NOTE — Telephone Encounter (Signed)
Patient called to schedule an appointment with Dr. Allen Norris for abdominal swelling from Dr. Luan Pulling office. I explained to patient that I can put her on the schedule on Sept. 21, 2017 but to call back to Dr. Luan Pulling office to see if he can do anything in the mean time. Paitient  said "fuck it" and abruptly hung up. I kept her on the schedule.

## 2016-02-20 DIAGNOSIS — F3342 Major depressive disorder, recurrent, in full remission: Secondary | ICD-10-CM | POA: Diagnosis not present

## 2016-02-23 ENCOUNTER — Ambulatory Visit (INDEPENDENT_AMBULATORY_CARE_PROVIDER_SITE_OTHER): Payer: Medicare Other | Admitting: Family Medicine

## 2016-02-23 ENCOUNTER — Encounter: Payer: Self-pay | Admitting: Family Medicine

## 2016-02-23 VITALS — BP 126/81 | HR 111 | Temp 98.9°F | Resp 16 | Ht 66.0 in | Wt 136.0 lb

## 2016-02-23 DIAGNOSIS — B001 Herpesviral vesicular dermatitis: Secondary | ICD-10-CM | POA: Diagnosis not present

## 2016-02-23 DIAGNOSIS — F314 Bipolar disorder, current episode depressed, severe, without psychotic features: Secondary | ICD-10-CM

## 2016-02-23 DIAGNOSIS — R1907 Generalized intra-abdominal and pelvic swelling, mass and lump: Secondary | ICD-10-CM

## 2016-02-23 DIAGNOSIS — R21 Rash and other nonspecific skin eruption: Secondary | ICD-10-CM | POA: Diagnosis not present

## 2016-02-23 DIAGNOSIS — J40 Bronchitis, not specified as acute or chronic: Secondary | ICD-10-CM

## 2016-02-23 DIAGNOSIS — L280 Lichen simplex chronicus: Secondary | ICD-10-CM

## 2016-02-23 MED ORDER — DOXYCYCLINE HYCLATE 100 MG PO TABS: 100.0000 mg | ORAL_TABLET | Freq: Two times a day (BID) | ORAL | 0 refills | Status: DC

## 2016-02-23 NOTE — Progress Notes (Signed)
Name: Paula Frank   MRN: [id number]    DOB: Mar 11, 1941   Date:02/23/2016       Progress Note  Subjective  Chief Complaint  Chief Complaint  Patient presents with  . Bloated    HPI Here for f/u of her abd. bloating.  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  Had a flair of fever blisters.  Present for 7-8 days now.  On the way to getting better.  She has also has a cough that is productive for past 2-3 days.   No problem-specific Assessment & Plan notes found for this encounter.   Past Medical History:  Diagnosis Date  . Anxiety   . Chronic kidney disease   . Chronic pain   . Discoid lupus   . Diverticulitis   . Gastritis   . Lupus (systemic lupus erythematosus) ([place])   . Restless leg   . Seizures ([place])     Past Surgical History:  Procedure Laterality Date  . ABDOMINAL HYSTERECTOMY    . APPENDECTOMY    . KNEE ARTHROSCOPY W/ MENISCECTOMY    . LAPAROSCOPY    . LITHOTRIPSY    . ROTATOR CUFF REPAIR    . ROTATOR CUFF REPAIR Bilateral    x2  . TUBAL LIGATION      Family History  Problem Relation Age of Onset  . Cancer Mother   . Esophageal cancer Father   . Cancer Son   . Breast cancer Sister     Social History   Social History  . Marital status: Widowed    Spouse name: N/A  . Number of children: 3  . Years of education: N/A   Occupational History  . retired Therapist, sports    Social History Main Topics  . Smoking status: Current Every Day Smoker    Packs/day: 1.00    Years: 60.00    Types: Cigarettes  . Smokeless tobacco: Never Used  . Alcohol use 4.2 - 8.4 oz/week    7 - 14 Standard drinks or equivalent per week     Comment: 2 glasses of wine a day  . Drug use: No  . Sexual activity: Not on file   Other Topics Concern  . Not on file   Social History Narrative   Lives with friend.   She retired Therapist, sports and worked in psychiatry.           Current Outpatient Prescriptions:  .  Armodafinil (NUVIGIL) 250 MG tablet, Take 250 mg by mouth daily. Reported  on 12/29/2015, Disp: , Rfl:  .  buPROPion (WELLBUTRIN XL) 150 MG 24 hr tablet, Take 1 tablet (150 mg total) by mouth daily., Disp: 30 tablet, Rfl: 0 .  clobetasol ointment (TEMOVATE) 2.11 %, Apply 1 application topically 2 (two) times daily. Apply to arms and shins., Disp: , Rfl:  .  diphenhydrAMINE (BENADRYL) 25 mg capsule, Take 25 mg by mouth 2 (two) times daily as needed. Reported on 12/08/2015, Disp: , Rfl:  .  divalproex (DEPAKOTE) 250 MG DR tablet, Take 1 tablet (250 mg total) by mouth every 8 (eight) hours., Disp: 90 tablet, Rfl: 0 .  doxepin (SINEQUAN) 25 MG capsule, TAKE 1 CAPSULE BY MOUTH AT BEDTIME, Disp: 30 capsule, Rfl: 0 .  doxycycline (VIBRA-TABS) 100 MG tablet, Take 1 tablet (100 mg total) by mouth 2 (two) times daily. Take with food., Disp: 20 tablet, Rfl: 0 .  halobetasol (ULTRAVATE) 0.05 % ointment, Apply 1 application topically 2 (two) times daily., Disp: , Rfl: 5 .  levETIRAcetam (KEPPRA) 500 MG tablet, Take 1 tablet (500 mg total) by mouth 2 (two) times daily., Disp: 60 tablet, Rfl: 0 .  montelukast (SINGULAIR) 10 MG tablet, TAKE 1 TABLET (10 MG TOTAL) BY MOUTH DAILY., Disp: 30 tablet, Rfl: 6 .  naproxen (NAPROSYN) 500 MG tablet, Take 1 tablet (500 mg total) by mouth 2 (two) times daily with a meal., Disp: 20 tablet, Rfl: 0 .  ondansetron (ZOFRAN) 4 MG tablet, TAKE 1 TABLET (4 MG TOTAL) BY MOUTH EVERY 8 (EIGHT) HOURS AS NEEDED FOR NAUSEA OR VOMITING., Disp: 30 tablet, Rfl: 2 .  oxymetazoline (AFRIN) 0.05 % nasal spray, Place 1 spray into both nostrils 2 (two) times daily., Disp: , Rfl:  .  QUEtiapine (SEROQUEL) 400 MG tablet, Take 400 mg by mouth at bedtime. Reported on 12/29/2015, Disp: , Rfl:  .  rOPINIRole (REQUIP) 1 MG tablet, TAKE 1 TABLET EVERY MORNING AND 2 TABLETS EVERY NIGHT PATIENT NEEDS APPOINTMENT, Disp: 90 tablet, Rfl: 0 .  triamcinolone ointment (KENALOG) 0.5 %, Apply 1 application topically 2 (two) times daily., Disp: 60 g, Rfl: 2  Allergies  Allergen Reactions  .  Amoxicillin Other (See Comments)  . Ioxaglate     Other reaction(s): Unknown  . Ivp Dye [Iodinated Diagnostic Agents]     Other reaction(s): Unknown  . Levaquin [Levofloxacin In D5w]   . Levofloxacin     Other reaction(s): Unknown  . Moxifloxacin Hcl In Nacl     Other reaction(s): Unknown  . Erythromycin Rash    Canker sore  . Sulfa Antibiotics Rash    Other reaction(s): Unknown     Review of Systems  Constitutional: Positive for weight loss. Negative for chills, fever and malaise/fatigue.  HENT: Negative for hearing loss.   Eyes: Negative for blurred vision and double vision.  Respiratory: Positive for cough and sputum production (brown). Negative for shortness of breath and wheezing.   Cardiovascular: Negative for chest pain, palpitations and leg swelling.  Gastrointestinal: Negative for abdominal pain, blood in stool and heartburn.       Abdominal bloating  Genitourinary: Negative for dysuria, frequency and urgency.  Musculoskeletal: Negative for joint pain and myalgias.  Skin: Positive for itching and rash.  Neurological: Negative for weakness and headaches.      Objective  Vitals:   02/23/16 1453  BP: 126/81  Pulse: (!) 111  Resp: 16  Temp: 98.9 F (37.2 C)  TempSrc: Oral  Weight: 136 lb (61.7 kg)  Height: 5' 6"  (1.676 m)    Physical Exam  Constitutional: She is oriented to person, place, and time and well-developed, well-nourished, and in no distress. No distress.  HENT:  Head: Normocephalic and atraumatic.  Cardiovascular: Normal rate, regular rhythm and normal heart sounds.  Exam reveals no gallop and no friction rub.   No murmur heard. Pulmonary/Chest: No respiratory distress. She has no wheezes. She has no rales.  Coarse breath sounds throughout.  Abdominal: She exhibits distension. She exhibits no mass. There is no tenderness.  Abdominal bloating  Musculoskeletal: She exhibits no edema.  Neurological: She is alert and oriented to person, place, and  time.  Skin:  Neurodermatitis excoriations diffusely over arms and legs. Resolving herpes labilias lower lip and bilateral corners of mouth  Psychiatric:  Tearful when talking about her deceased son.  Vitals reviewed.      Recent Results (from the past 2160 hour(s))  CBC with Differential     Status: Abnormal   Collection Time: 01/26/16  4:18 PM  Result  Value Ref Range   WBC 7.2 3.8 - 10.8 K/uL   RBC 3.63 (L) 3.80 - 5.10 MIL/uL   Hemoglobin 11.9 11.7 - 15.5 g/dL   HCT 36.5 35.0 - 45.0 %   MCV 100.6 (H) 80.0 - 100.0 fL   MCH 32.8 27.0 - 33.0 pg   MCHC 32.6 32.0 - 36.0 g/dL   RDW 14.8 11.0 - 15.0 %   Platelets 390 140 - 400 K/uL   MPV 8.5 7.5 - 12.5 fL   Neutro Abs 4,464 1,500 - 7,800 cells/uL   Lymphs Abs 2,016 850 - 3,900 cells/uL   Monocytes Absolute 576 200 - 950 cells/uL   Eosinophils Absolute 144 15 - 500 cells/uL   Basophils Absolute 0 0 - 200 cells/uL   Neutrophils Relative % 62 %   Lymphocytes Relative 28 %   Monocytes Relative 8 %   Eosinophils Relative 2 %   Basophils Relative 0 %   Smear Review Criteria for review not met     Comment: ** Please note change in unit of measure and reference range(s). **  COMPLETE METABOLIC PANEL WITH GFR     Status: Abnormal   Collection Time: 01/26/16  4:18 PM  Result Value Ref Range   Sodium 141 135 - 146 mmol/L   Potassium 5.6 (H) 3.5 - 5.3 mmol/L    Comment: Slight Hemolysis   Chloride 111 (H) 98 - 110 mmol/L   CO2 22 20 - 31 mmol/L   Glucose, Bld 68 65 - 99 mg/dL   BUN 17 7 - 25 mg/dL   Creat 0.79 0.60 - 0.93 mg/dL    Comment:   For patients > or = 75 years of age: The upper reference limit for Creatinine is approximately 13% higher for people identified as African-American.      Total Bilirubin 0.2 0.2 - 1.2 mg/dL   Alkaline Phosphatase 129 33 - 130 U/L   AST 20 10 - 35 U/L   ALT 15 6 - 29 U/L   Total Protein 6.8 6.1 - 8.1 g/dL   Albumin 3.6 3.6 - 5.1 g/dL   Calcium 9.2 8.6 - 10.4 mg/dL   GFR, Est African  American 85 >=60 mL/min   GFR, Est Non African American 73 >=60 mL/min  BASIC METABOLIC PANEL WITH GFR     Status: None   Collection Time: 02/07/16 10:08 AM  Result Value Ref Range   Sodium 140 135 - 146 mmol/L   Potassium 4.4 3.5 - 5.3 mmol/L   Chloride 108 98 - 110 mmol/L   CO2 22 20 - 31 mmol/L   Glucose, Bld 90 65 - 99 mg/dL   BUN 14 7 - 25 mg/dL   Creat 0.80 0.60 - 0.93 mg/dL    Comment:   For patients > or = 75 years of age: The upper reference limit for Creatinine is approximately 13% higher for people identified as African-American.      Calcium 9.2 8.6 - 10.4 mg/dL   GFR, Est African American 83 >=60 mL/min   GFR, Est Non African American 72 >=60 mL/min  Vitamin B12     Status: None   Collection Time: 02/07/16 10:08 AM  Result Value Ref Range   Vitamin B-12 238 200 - 1,100 pg/mL     Assessment & Plan  Problem List Items Addressed This Visit      Respiratory   Bronchitis   Relevant Medications   doxycycline (VIBRA-TABS) 100 MG tablet     Digestive  Herpes labialis     Musculoskeletal and Integument   Neurodermatitis   Rash   Relevant Medications   doxycycline (VIBRA-TABS) 100 MG tablet     Other   Bipolar I disorder, most recent episode depressed, severe without psychotic features ([name])   Abdominal swelling, generalized - Primary    Other Visit Diagnoses   None.     Meds ordered this encounter  Medications  . doxycycline (VIBRA-TABS) 100 MG tablet    Sig: Take 1 tablet (100 mg total) by mouth 2 (two) times daily. Take with food.    Dispense:  20 tablet    Refill:  0   1. Abdominal swelling, generalized Keep appt with Dr. Allen Norris  2. Bronchitis  - doxycycline (VIBRA-TABS) 100 MG tablet; Take 1 tablet (100 mg total) by mouth 2 (two) times daily. Take with food.  Dispense: 20 tablet; Refill: 0  3. Herpes labialis Cont Abreva.  4. Neurodermatitis Use steroid cream as needed.  5. Bipolar I disorder, most recent episode depressed, severe  without psychotic features ([place]) Cont Psych meds  6. Rash  - doxycycline (VIBRA-TABS) 100 MG tablet; Take 1 tablet (100 mg total) by mouth 2 (two) times daily. Take with food.  Dispense: 20 tablet; Refill: 0

## 2016-02-24 ENCOUNTER — Telehealth: Payer: Self-pay | Admitting: Family Medicine

## 2016-02-24 NOTE — Telephone Encounter (Signed)
Please call this patient to determine what exactly she is looking for- Dr. Luan Pulling gave her an antibiotic yesterday. Does she need a refill on her Zofran to help with nausea?  Thanks! AK

## 2016-02-24 NOTE — Telephone Encounter (Signed)
Patient is feeling better. Reminded her she has additional refills on zofran at pharmacy.

## 2016-02-24 NOTE — Telephone Encounter (Signed)
Pt has been throwing up and running a fever all night.  She asked if something could be called in to [facility] on [address].  Please call [phone]

## 2016-02-24 NOTE — Telephone Encounter (Signed)
Trying to call patient number listed is [employer], [employer] and she is not taking care of this patient and cell number is not correct will try again.

## 2016-02-29 DIAGNOSIS — Z9842 Cataract extraction status, left eye: Secondary | ICD-10-CM | POA: Diagnosis not present

## 2016-02-29 DIAGNOSIS — H52213 Irregular astigmatism, bilateral: Secondary | ICD-10-CM | POA: Diagnosis not present

## 2016-02-29 DIAGNOSIS — Z9841 Cataract extraction status, right eye: Secondary | ICD-10-CM | POA: Diagnosis not present

## 2016-03-14 DIAGNOSIS — L298 Other pruritus: Secondary | ICD-10-CM | POA: Diagnosis not present

## 2016-03-15 ENCOUNTER — Ambulatory Visit: Payer: Medicare Other | Admitting: Gastroenterology

## 2016-03-28 NOTE — Progress Notes (Signed)
Referral done

## 2016-04-06 ENCOUNTER — Telehealth: Payer: Self-pay | Admitting: Family Medicine

## 2016-04-06 NOTE — Telephone Encounter (Signed)
Sonya in administration at [facility] at [facility]  in [place] said pt's son sent an order of admission for Dr. Luan Pulling to fill out.  Pt's son, power of attorney, emailed it to [facility] but they are unable to open email.  She asked to have form and last 2 office notes faxed to [phone].  She said the form listed medications, diagnosis and a place to check for them to administer medications.  Her call back number is [phone]

## 2016-04-06 NOTE — Telephone Encounter (Signed)
Notes and order faxed. Called facility and verify that they have been received.

## 2016-04-09 ENCOUNTER — Ambulatory Visit (INDEPENDENT_AMBULATORY_CARE_PROVIDER_SITE_OTHER): Payer: Medicare Other | Admitting: Family Medicine

## 2016-04-09 ENCOUNTER — Encounter: Payer: Self-pay | Admitting: Family Medicine

## 2016-04-09 VITALS — BP 156/82 | HR 109 | Temp 98.0°F | Resp 16 | Ht 66.0 in | Wt 132.0 lb

## 2016-04-09 DIAGNOSIS — G43A0 Cyclical vomiting, not intractable: Secondary | ICD-10-CM

## 2016-04-09 DIAGNOSIS — R1907 Generalized intra-abdominal and pelvic swelling, mass and lump: Secondary | ICD-10-CM

## 2016-04-09 DIAGNOSIS — R112 Nausea with vomiting, unspecified: Secondary | ICD-10-CM | POA: Insufficient documentation

## 2016-04-09 DIAGNOSIS — R1115 Cyclical vomiting syndrome unrelated to migraine: Secondary | ICD-10-CM

## 2016-04-09 MED ORDER — ONDANSETRON HCL 4 MG PO TABS: 4.0000 mg | ORAL_TABLET | Freq: Three times a day (TID) | ORAL | 3 refills | Status: DC | PRN

## 2016-04-09 NOTE — Progress Notes (Signed)
Name: Paula Frank   MRN: [id number]    DOB: May 26, 1941   Date:04/09/2016       Progress Note  Subjective  Chief Complaint  Chief Complaint  Patient presents with  . Emesis    HPI Here c/o nausea and vomiting on and off for 3 days.  She has had some diarrhea (chunky, normal color)  Her last Vomiting was 12 hrs ago.   She has been taking fluids ok.  She is out of Zofran.   She is moving to [place] next week.  No problem-specific Assessment & Plan notes found for this encounter.   Past Medical History:  Diagnosis Date  . Anxiety   . Chronic kidney disease   . Chronic pain   . Discoid lupus   . Diverticulitis   . Gastritis   . Lupus (systemic lupus erythematosus) ([place])   . Restless leg   . Seizures ([place])     Social History  Substance Use Topics  . Smoking status: Current Every Day Smoker    Packs/day: 1.00    Years: 60.00    Types: Cigarettes  . Smokeless tobacco: Never Used  . Alcohol use 4.2 - 8.4 oz/week    7 - 14 Standard drinks or equivalent per week     Comment: 2 glasses of wine a day     Current Outpatient Prescriptions:  .  Armodafinil (NUVIGIL) 250 MG tablet, Take 250 mg by mouth daily. Reported on 12/29/2015, Disp: , Rfl:  .  buPROPion (WELLBUTRIN XL) 150 MG 24 hr tablet, Take 1 tablet (150 mg total) by mouth daily., Disp: 30 tablet, Rfl: 0 .  clobetasol ointment (TEMOVATE) 9.16 %, Apply 1 application topically 2 (two) times daily. Apply to arms and shins., Disp: , Rfl:  .  clonazePAM (KLONOPIN) 0.5 MG tablet, Take 0.5 mg by mouth 2 (two) times daily as needed., Disp: , Rfl:  .  diphenhydrAMINE (BENADRYL) 25 mg capsule, Take 25 mg by mouth 2 (two) times daily as needed. Reported on 12/08/2015, Disp: , Rfl:  .  divalproex (DEPAKOTE) 250 MG DR tablet, Take 1 tablet (250 mg total) by mouth every 8 (eight) hours., Disp: 90 tablet, Rfl: 0 .  doxepin (SINEQUAN) 25 MG capsule, TAKE 1 CAPSULE BY MOUTH AT BEDTIME, Disp: 30 capsule, Rfl: 0 .  fluocinonide  ointment (LIDEX) 3.84 %, Apply 2 application topically 2 (two) times daily., Disp: , Rfl: 3 .  halobetasol (ULTRAVATE) 0.05 % ointment, Apply 1 application topically 2 (two) times daily., Disp: , Rfl: 5 .  levETIRAcetam (KEPPRA) 500 MG tablet, Take 1 tablet (500 mg total) by mouth 2 (two) times daily., Disp: 60 tablet, Rfl: 0 .  montelukast (SINGULAIR) 10 MG tablet, TAKE 1 TABLET (10 MG TOTAL) BY MOUTH DAILY., Disp: 30 tablet, Rfl: 6 .  naproxen (NAPROSYN) 500 MG tablet, Take 1 tablet (500 mg total) by mouth 2 (two) times daily with a meal., Disp: 20 tablet, Rfl: 0 .  ondansetron (ZOFRAN) 4 MG tablet, TAKE 1 TABLET (4 MG TOTAL) BY MOUTH EVERY 8 (EIGHT) HOURS AS NEEDED FOR NAUSEA OR VOMITING., Disp: 30 tablet, Rfl: 2 .  oxymetazoline (AFRIN) 0.05 % nasal spray, Place 1 spray into both nostrils 2 (two) times daily., Disp: , Rfl:  .  QUEtiapine (SEROQUEL) 400 MG tablet, Take 400 mg by mouth at bedtime. Reported on 12/29/2015, Disp: , Rfl:  .  rOPINIRole (REQUIP) 1 MG tablet, TAKE 1 TABLET EVERY MORNING AND 2 TABLETS EVERY NIGHT PATIENT NEEDS APPOINTMENT, Disp:  90 tablet, Rfl: 0 .  triamcinolone ointment (KENALOG) 0.5 %, Apply 1 application topically 2 (two) times daily., Disp: 60 g, Rfl: 2  Allergies  Allergen Reactions  . Amoxicillin Other (See Comments)  . Ioxaglate     Other reaction(s): Unknown  . Ivp Dye [Iodinated Diagnostic Agents]     Other reaction(s): Unknown  . Levaquin [Levofloxacin In D5w]   . Levofloxacin     Other reaction(s): Unknown  . Moxifloxacin Hcl In Nacl     Other reaction(s): Unknown  . Erythromycin Rash    Canker sore  . Sulfa Antibiotics Rash    Other reaction(s): Unknown    Review of Systems  Constitutional: Positive for malaise/fatigue. Negative for chills, fever and weight loss.  HENT: Negative for hearing loss.   Eyes: Negative for blurred vision and double vision.  Respiratory: Negative for cough, shortness of breath and wheezing.   Cardiovascular: Negative  for chest pain, palpitations and leg swelling.  Gastrointestinal: Positive for diarrhea, nausea and vomiting. Negative for abdominal pain, blood in stool, constipation, heartburn and melena.  Genitourinary: Negative for dysuria, frequency and urgency.  Musculoskeletal: Negative for joint pain and myalgias.  Skin: Positive for itching and rash.  Neurological: Positive for weakness. Negative for dizziness, tremors and headaches.      Objective  Vitals:   04/09/16 1442  BP: (!) 156/82  Pulse: (!) 109  Resp: 16  Temp: 98 F (36.7 C)  TempSrc: Oral  Weight: 132 lb (59.9 kg)  Height: 5' 6"  (1.676 m)     Physical Exam  Constitutional: She is oriented to person, place, and time and well-developed, well-nourished, and in no distress. No distress.  HENT:  Head: Normocephalic and atraumatic.  Cardiovascular: Regular rhythm and normal heart sounds.  Tachycardia present.  Exam reveals no gallop and no friction rub.   No murmur heard. Pulmonary/Chest: Effort normal and breath sounds normal. No respiratory distress. She has no wheezes. She has no rales.  Abdominal: Soft. Bowel sounds are normal. She exhibits distension. She exhibits no mass. There is no tenderness. There is no rebound and no guarding.  Musculoskeletal: She exhibits no edema.  Neurological: She is alert and oriented to person, place, and time.  Vitals reviewed.     Recent Results (from the past 2160 hour(s))  CBC with Differential     Status: Abnormal   Collection Time: 01/26/16  4:18 PM  Result Value Ref Range   WBC 7.2 3.8 - 10.8 K/uL   RBC 3.63 (L) 3.80 - 5.10 MIL/uL   Hemoglobin 11.9 11.7 - 15.5 g/dL   HCT 36.5 35.0 - 45.0 %   MCV 100.6 (H) 80.0 - 100.0 fL   MCH 32.8 27.0 - 33.0 pg   MCHC 32.6 32.0 - 36.0 g/dL   RDW 14.8 11.0 - 15.0 %   Platelets 390 140 - 400 K/uL   MPV 8.5 7.5 - 12.5 fL   Neutro Abs 4,464 1,500 - 7,800 cells/uL   Lymphs Abs 2,016 850 - 3,900 cells/uL   Monocytes Absolute 576 200 - 950  cells/uL   Eosinophils Absolute 144 15 - 500 cells/uL   Basophils Absolute 0 0 - 200 cells/uL   Neutrophils Relative % 62 %   Lymphocytes Relative 28 %   Monocytes Relative 8 %   Eosinophils Relative 2 %   Basophils Relative 0 %   Smear Review Criteria for review not met     Comment: ** Please note change in unit of measure and reference  range(s). **  COMPLETE METABOLIC PANEL WITH GFR     Status: Abnormal   Collection Time: 01/26/16  4:18 PM  Result Value Ref Range   Sodium 141 135 - 146 mmol/L   Potassium 5.6 (H) 3.5 - 5.3 mmol/L    Comment: Slight Hemolysis   Chloride 111 (H) 98 - 110 mmol/L   CO2 22 20 - 31 mmol/L   Glucose, Bld 68 65 - 99 mg/dL   BUN 17 7 - 25 mg/dL   Creat 0.79 0.60 - 0.93 mg/dL    Comment:   For patients > or = 75 years of age: The upper reference limit for Creatinine is approximately 13% higher for people identified as African-American.      Total Bilirubin 0.2 0.2 - 1.2 mg/dL   Alkaline Phosphatase 129 33 - 130 U/L   AST 20 10 - 35 U/L   ALT 15 6 - 29 U/L   Total Protein 6.8 6.1 - 8.1 g/dL   Albumin 3.6 3.6 - 5.1 g/dL   Calcium 9.2 8.6 - 10.4 mg/dL   GFR, Est African American 85 >=60 mL/min   GFR, Est Non African American 73 >=60 mL/min  BASIC METABOLIC PANEL WITH GFR     Status: None   Collection Time: 02/07/16 10:08 AM  Result Value Ref Range   Sodium 140 135 - 146 mmol/L   Potassium 4.4 3.5 - 5.3 mmol/L   Chloride 108 98 - 110 mmol/L   CO2 22 20 - 31 mmol/L   Glucose, Bld 90 65 - 99 mg/dL   BUN 14 7 - 25 mg/dL   Creat 0.80 0.60 - 0.93 mg/dL    Comment:   For patients > or = 75 years of age: The upper reference limit for Creatinine is approximately 13% higher for people identified as African-American.      Calcium 9.2 8.6 - 10.4 mg/dL   GFR, Est African American 83 >=60 mL/min   GFR, Est Non African American 72 >=60 mL/min  Vitamin B12     Status: None   Collection Time: 02/07/16 10:08 AM  Result Value Ref Range   Vitamin B-12 238  200 - 1,100 pg/mL     Assessment & Plan  1. Abdominal swelling, generalized See GI specialist ASAP on getting to [place]  2. Non-intractable cyclical vomiting with nausea Refilled Zofran Clear fluids as much as possible.

## 2016-04-12 ENCOUNTER — Ambulatory Visit: Payer: Medicare Other | Admitting: Gastroenterology

## 2016-04-25 DIAGNOSIS — L308 Other specified dermatitis: Secondary | ICD-10-CM | POA: Diagnosis not present

## 2016-04-25 DIAGNOSIS — L080 Pyoderma: Secondary | ICD-10-CM | POA: Diagnosis not present

## 2016-04-25 DIAGNOSIS — L298 Other pruritus: Secondary | ICD-10-CM | POA: Diagnosis not present

## 2016-04-25 DIAGNOSIS — L309 Dermatitis, unspecified: Secondary | ICD-10-CM | POA: Diagnosis not present

## 2016-05-09 DIAGNOSIS — G479 Sleep disorder, unspecified: Secondary | ICD-10-CM | POA: Diagnosis not present

## 2016-05-09 DIAGNOSIS — R262 Difficulty in walking, not elsewhere classified: Secondary | ICD-10-CM | POA: Diagnosis not present

## 2016-05-09 DIAGNOSIS — R413 Other amnesia: Secondary | ICD-10-CM | POA: Diagnosis not present

## 2016-05-09 DIAGNOSIS — R21 Rash and other nonspecific skin eruption: Secondary | ICD-10-CM | POA: Diagnosis not present

## 2016-05-09 DIAGNOSIS — G2581 Restless legs syndrome: Secondary | ICD-10-CM | POA: Diagnosis not present

## 2016-05-09 DIAGNOSIS — F39 Unspecified mood [affective] disorder: Secondary | ICD-10-CM | POA: Diagnosis not present

## 2016-05-09 DIAGNOSIS — R569 Unspecified convulsions: Secondary | ICD-10-CM | POA: Diagnosis not present

## 2016-05-09 DIAGNOSIS — G629 Polyneuropathy, unspecified: Secondary | ICD-10-CM | POA: Diagnosis not present

## 2016-05-23 DIAGNOSIS — G309 Alzheimer's disease, unspecified: Secondary | ICD-10-CM | POA: Diagnosis not present

## 2016-05-23 DIAGNOSIS — R21 Rash and other nonspecific skin eruption: Secondary | ICD-10-CM | POA: Diagnosis not present

## 2016-05-23 DIAGNOSIS — R2242 Localized swelling, mass and lump, left lower limb: Secondary | ICD-10-CM | POA: Diagnosis not present

## 2016-05-23 DIAGNOSIS — M7989 Other specified soft tissue disorders: Secondary | ICD-10-CM | POA: Diagnosis not present

## 2016-05-23 DIAGNOSIS — R600 Localized edema: Secondary | ICD-10-CM | POA: Diagnosis not present

## 2016-05-23 DIAGNOSIS — F319 Bipolar disorder, unspecified: Secondary | ICD-10-CM | POA: Diagnosis not present

## 2016-05-29 DIAGNOSIS — F3131 Bipolar disorder, current episode depressed, mild: Secondary | ICD-10-CM | POA: Diagnosis not present

## 2016-06-12 DIAGNOSIS — L98499 Non-pressure chronic ulcer of skin of other sites with unspecified severity: Secondary | ICD-10-CM | POA: Diagnosis not present

## 2016-06-12 DIAGNOSIS — F319 Bipolar disorder, unspecified: Secondary | ICD-10-CM | POA: Diagnosis not present

## 2016-06-12 DIAGNOSIS — L299 Pruritus, unspecified: Secondary | ICD-10-CM | POA: Diagnosis not present

## 2016-06-12 DIAGNOSIS — R21 Rash and other nonspecific skin eruption: Secondary | ICD-10-CM | POA: Diagnosis not present

## 2016-06-12 DIAGNOSIS — F5101 Primary insomnia: Secondary | ICD-10-CM | POA: Diagnosis not present

## 2016-06-26 DIAGNOSIS — R21 Rash and other nonspecific skin eruption: Secondary | ICD-10-CM | POA: Diagnosis not present

## 2016-06-26 DIAGNOSIS — L98499 Non-pressure chronic ulcer of skin of other sites with unspecified severity: Secondary | ICD-10-CM | POA: Diagnosis not present

## 2016-06-26 DIAGNOSIS — F319 Bipolar disorder, unspecified: Secondary | ICD-10-CM | POA: Diagnosis not present

## 2016-07-11 ENCOUNTER — Emergency Department: Payer: Medicare Other

## 2016-07-11 ENCOUNTER — Encounter: Payer: Self-pay | Admitting: Emergency Medicine

## 2016-07-11 ENCOUNTER — Emergency Department
Admission: EM | Admit: 2016-07-11 | Discharge: 2016-07-11 | Disposition: A | Discharge status: Home / Self Care | Payer: Medicare Other | Attending: Emergency Medicine | Admitting: Emergency Medicine

## 2016-07-11 DIAGNOSIS — G309 Alzheimer's disease, unspecified: Secondary | ICD-10-CM | POA: Diagnosis not present

## 2016-07-11 DIAGNOSIS — F17210 Nicotine dependence, cigarettes, uncomplicated: Secondary | ICD-10-CM | POA: Diagnosis not present

## 2016-07-11 DIAGNOSIS — R110 Nausea: Secondary | ICD-10-CM | POA: Diagnosis not present

## 2016-07-11 DIAGNOSIS — N189 Chronic kidney disease, unspecified: Secondary | ICD-10-CM | POA: Insufficient documentation

## 2016-07-11 DIAGNOSIS — M549 Dorsalgia, unspecified: Secondary | ICD-10-CM | POA: Diagnosis present

## 2016-07-11 DIAGNOSIS — Z79899 Other long term (current) drug therapy: Secondary | ICD-10-CM | POA: Diagnosis not present

## 2016-07-11 DIAGNOSIS — R05 Cough: Secondary | ICD-10-CM | POA: Diagnosis not present

## 2016-07-11 DIAGNOSIS — N390 Urinary tract infection, site not specified: Secondary | ICD-10-CM | POA: Insufficient documentation

## 2016-07-11 DIAGNOSIS — R000 Tachycardia, unspecified: Secondary | ICD-10-CM | POA: Diagnosis not present

## 2016-07-11 DIAGNOSIS — M79603 Pain in arm, unspecified: Secondary | ICD-10-CM | POA: Diagnosis not present

## 2016-07-11 LAB — COMPREHENSIVE METABOLIC PANEL
ALT: 12 U/L — ABNORMAL LOW (ref 14–54)
ANION GAP: 8 (ref 5–15)
AST: 17 U/L (ref 15–41)
Albumin: 2.9 g/dL — ABNORMAL LOW (ref 3.5–5.0)
Alkaline Phosphatase: 108 U/L (ref 38–126)
BILIRUBIN TOTAL: 0.3 mg/dL (ref 0.3–1.2)
BUN: 12 mg/dL (ref 6–20)
CO2: 26 mmol/L (ref 22–32)
Calcium: 8.9 mg/dL (ref 8.9–10.3)
Chloride: 105 mmol/L (ref 101–111)
Creatinine, Ser: 0.73 mg/dL (ref 0.44–1.00)
Glucose, Bld: 105 mg/dL — ABNORMAL HIGH (ref 65–99)
POTASSIUM: 4.5 mmol/L (ref 3.5–5.1)
Sodium: 139 mmol/L (ref 135–145)
TOTAL PROTEIN: 7.4 g/dL (ref 6.5–8.1)

## 2016-07-11 LAB — URINALYSIS, COMPLETE (UACMP) WITH MICROSCOPIC
BILIRUBIN URINE: NEGATIVE
GLUCOSE, UA: NEGATIVE mg/dL
Hgb urine dipstick: NEGATIVE
KETONES UR: NEGATIVE mg/dL
Nitrite: NEGATIVE
PH: 5 (ref 5.0–8.0)
PROTEIN: NEGATIVE mg/dL
Specific Gravity, Urine: 1.008 (ref 1.005–1.030)

## 2016-07-11 LAB — CBC
HEMATOCRIT: 33.9 % — AB (ref 35.0–47.0)
HEMOGLOBIN: 11.4 g/dL — AB (ref 12.0–16.0)
MCH: 32.5 pg (ref 26.0–34.0)
MCHC: 33.7 g/dL (ref 32.0–36.0)
MCV: 96.4 fL (ref 80.0–100.0)
Platelets: 769 10*3/uL — ABNORMAL HIGH (ref 150–440)
RBC: 3.51 MIL/uL — ABNORMAL LOW (ref 3.80–5.20)
RDW: 15.7 % — ABNORMAL HIGH (ref 11.5–14.5)
WBC: 8.2 10*3/uL (ref 3.6–11.0)

## 2016-07-11 LAB — TROPONIN I
TROPONIN I: 0.03 ng/mL — AB (ref <0.03)
Troponin I: <0.03 ng/mL (ref <0.03)

## 2016-07-11 MED ORDER — ONDANSETRON HCL 4 MG PO TABS: 4.0000 mg | ORAL_TABLET | Freq: Three times a day (TID) | ORAL | 0 refills | Status: DC | PRN

## 2016-07-11 MED ORDER — ONDANSETRON 4 MG PO TBDP
4.0000 mg | ORAL_TABLET | Freq: Once | ORAL | Status: AC
  Administered 2016-07-11: 4 mg via ORAL

## 2016-07-11 MED ORDER — CEPHALEXIN 500 MG PO CAPS: 500.0000 mg | ORAL_CAPSULE | Freq: Two times a day (BID) | ORAL | 0 refills | Status: DC

## 2016-07-11 MED ORDER — ASPIRIN 81 MG PO CHEW
324.0000 mg | CHEWABLE_TABLET | Freq: Once | ORAL | Status: AC
  Administered 2016-07-11: 324 mg via ORAL
  Filled 2016-07-11: qty 4

## 2016-07-11 MED ORDER — ONDANSETRON 4 MG PO TBDP
ORAL_TABLET | ORAL | Status: AC
  Administered 2016-07-11: 4 mg via ORAL
  Filled 2016-07-11: qty 1

## 2016-07-11 MED ORDER — NICOTINE 21 MG/24HR TD PT24
21.0000 mg | MEDICATED_PATCH | Freq: Once | TRANSDERMAL | Status: DC
  Administered 2016-07-11: 21 mg via TRANSDERMAL

## 2016-07-11 MED ORDER — NICOTINE 21 MG/24HR TD PT24
MEDICATED_PATCH | TRANSDERMAL | Status: AC
  Administered 2016-07-11: 21 mg via TRANSDERMAL
  Filled 2016-07-11: qty 1

## 2016-07-11 NOTE — ED Triage Notes (Signed)
Pt arrived via [facility] with complaint of nausea, shoulder, and back pain. Pt denies any vomiting and no fever noted.

## 2016-07-11 NOTE — ED Notes (Signed)
Pt complains of nausea. MD made aware.

## 2016-07-11 NOTE — ED Provider Notes (Signed)
[facility] Emergency Department Provider Note ____________________________________________   I have reviewed the triage vital signs and the triage nursing note.  HISTORY  Chief Complaint Nausea; Shoulder Pain; and Back Pain   Historian Patient  HPI Paula Frank is a 76 y.o. female has a history of lupus, and anxiety which she attributes to a grief ongoing after loss of her oldest son, presents today with nausea this morning. To me she denies chest pain, dyspnea, palpitations, dizziness, weakness, numbness, or any neck, shoulder or back pain.  She states that because of her anxiety as she tends to always have an elevated heart rate around 120. She follows with Dr. Luan Pulling. She states that she gets extra anxious when she has any symptoms thinking that she might have cancer given what happened with her son.  She received Zofran upon arrival by protocol and when I see her is no longer feeling nauseated.  No fever. No vomiting. No diarrhea. No abdominal pain.    Past Medical History:  Diagnosis Date  . Anxiety   . Chronic kidney disease   . Chronic pain   . Discoid lupus   . Diverticulitis   . Gastritis   . Lupus (systemic lupus erythematosus) ([place])   . Restless leg   . Seizures [facility])     Patient Active Problem List   Diagnosis Date Noted  . Nausea & vomiting 04/09/2016  . Bronchitis 02/23/2016  . Herpes labialis 02/23/2016  . Abdominal swelling, generalized 01/26/2016  . Bilateral arm weakness 10/12/2015  . Difficulty walking 10/12/2015  . Neck pain 10/12/2015  . Rash 10/12/2015  . Spell of altered consciousness 10/12/2015  . 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 Nausea & vomiting 04/04/2015  . Bipolar I disorder, most recent episode depressed, severe without psychotic features ([place]) 02/16/2015  . Alcohol abuse 02/16/2015  . Alzheimer's dementia 02/16/2015  . 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 Sacroiliac joint disease  10/25/2014  . 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 Current tobacco use 02/19/2014  . Systemic lupus erythematosus ([place]) 01/14/2014  . Spells 12/01/2013  . Seizure ([place]) 10/30/2013  . Disordered sleep 10/30/2013  . Adynamia 10/30/2013  . Blood pressure elevated 05/19/2012  . Fatigue 11/20/2011  . Bipolar 1 disorder ([place]) 10/22/2011    Past Surgical History:  Procedure Laterality Date  . ABDOMINAL HYSTERECTOMY    . APPENDECTOMY    . KNEE ARTHROSCOPY W/ MENISCECTOMY    . LAPAROSCOPY    . LITHOTRIPSY    . ROTATOR CUFF REPAIR    . ROTATOR CUFF REPAIR Bilateral    x2  . TUBAL LIGATION      Prior to Admission medications   Medication Sig Start Date End Date Taking? Authorizing Provider  Armodafinil (NUVIGIL) 250 MG tablet Take 250 mg by mouth daily. Reported on 12/29/2015   Yes Historical Provider, MD  buPROPion (WELLBUTRIN XL) 150 MG 24 hr tablet Take 1 tablet (150 mg total) by mouth daily. 10/31/15  Yes Gonzella Lex, MD  divalproex (DEPAKOTE) 250 MG DR tablet Take 1 tablet (250 mg total) by mouth every 8 (eight) hours. Patient taking differently: Take 250 mg by mouth daily.  10/31/15  Yes Gonzella Lex, MD  doxepin (SINEQUAN) 25 MG capsule TAKE 1 CAPSULE BY MOUTH AT BEDTIME 01/04/16  Yes Gonzella Lex, MD  levETIRAcetam (KEPPRA) 500 MG tablet Take 1 tablet (500 mg total) by mouth 2 (two) times daily. 10/31/15  Yes Gonzella Lex, MD  montelukast (SINGULAIR) 10 MG tablet TAKE 1 TABLET (10 MG TOTAL) BY MOUTH DAILY. 01/19/16  Yes Arlis Porta., MD  QUEtiapine (SEROQUEL) 400 MG tablet Take 400 mg by mouth at bedtime. Reported on 12/29/2015   Yes Historical Provider, MD  rOPINIRole (REQUIP) 1 MG tablet TAKE 1 TABLET EVERY MORNING AND 2 TABLETS EVERY NIGHT PATIENT NEEDS APPOINTMENT Patient taking differently: TAKE 1 TABLET EVERY MORNING AND 2 TABLETS EVERY NIGHT 08/15/15  Yes Arlis Porta., MD  triamcinolone ointment (KENALOG) 0.5 % Apply 1 application  topically 2 (two) times daily. 01/26/16  Yes Arlis Porta., MD  cephALEXin (KEFLEX) 500 MG capsule Take 1 capsule (500 mg total) by mouth 2 (two) times daily. 07/11/16   Lisa Roca, MD  clobetasol ointment (TEMOVATE) AB-123456789 % Apply 1 application topically 2 (two) times daily. Apply to arms and shins.    Historical Provider, MD  diphenhydrAMINE (BENADRYL) 25 mg capsule Take 25 mg by mouth 2 (two) times daily as needed. Reported on 12/08/2015    Historical Provider, MD  fluocinonide ointment (LIDEX) AB-123456789 % Apply 2 application topically 2 (two) times daily. 03/14/16   Historical Provider, MD  halobetasol (ULTRAVATE) 0.05 % ointment Apply 1 application topically 2 (two) times daily. 06/06/15   Historical Provider, MD  naproxen (NAPROSYN) 500 MG tablet Take 1 tablet (500 mg total) by mouth 2 (two) times daily with a meal. 06/14/15   Carrie Mew, MD  ondansetron (ZOFRAN) 4 MG tablet Take 1 tablet (4 mg total) by mouth every 8 (eight) hours as needed for nausea or vomiting. 07/11/16   Lisa Roca, MD  oxymetazoline (AFRIN) 0.05 % nasal spray Place 1 spray into both nostrils 2 (two) times daily.    Historical Provider, MD    Allergies  Allergen Reactions  . Amoxicillin Other (See Comments)  . Ioxaglate     Other reaction(s): Unknown  . Ivp Dye [Iodinated Diagnostic Agents]     Other reaction(s): Unknown  . Levaquin [Levofloxacin In D5w]   . Levofloxacin     Other reaction(s): Unknown  . Moxifloxacin Hcl In Nacl     Other reaction(s): Unknown  . Erythromycin Rash    Canker sore  . Sulfa Antibiotics Rash    Other reaction(s): Unknown    Family History  Problem Relation Age of Onset  . Cancer Mother   . Esophageal cancer Father   . Cancer Son   . Breast cancer Sister     Social History Social History  Substance Use Topics  . Smoking status: Current Every Day Smoker    Packs/day: 1.00    Years: 60.00    Types: Cigarettes  . Smokeless tobacco: Never Used  . Alcohol use 4.2 - 8.4  oz/week    7 - 14 Standard drinks or equivalent per week     Comment: 2 glasses of wine a day    Review of Systems  Constitutional: Negative for fever. Eyes: Negative for visual changes. ENT: Negative for sore throat. Cardiovascular: Negative for chest pain. Respiratory: Negative for shortness of breath. Gastrointestinal: Negative for abdominal pain, vomiting and diarrhea. Genitourinary: Negative for dysuria. Musculoskeletal: Negative for back pain. Skin: Negative for rash. Neurological: Negative for headache. 10 point Review of Systems otherwise negative ____________________________________________   PHYSICAL EXAM:  VITAL SIGNS: ED Triage Vitals  Enc Vitals Group     BP 07/11/16 0553 (!) 159/87     Pulse Rate 07/11/16 0553 (!) 109     Resp --  Temp 07/11/16 0553 98.8 F (37.1 C)     Temp Source 07/11/16 0553 Oral     SpO2 07/11/16 0553 92 %     Weight 07/11/16 0553 140 lb (63.5 kg)     Height 07/11/16 0553 5\' 6"  (1.676 m)     Head Circumference --      Peak Flow --      Pain Score 07/11/16 0554 6     Pain Loc --      Pain Edu? --      Excl. in [place]? --      Constitutional: Alert and oriented. Well appearing and in no distress.  He does become anxious as we talk more and more and get into the story about her loss of her son. HEENT   Head: Normocephalic and atraumatic.      Eyes: Conjunctivae are normal. PERRL. Normal extraocular movements.      Ears:         Nose: No congestion/rhinnorhea.   Mouth/Throat: Mucous membranes are moist.   Neck: No stridor. Cardiovascular/Chest: Tachycardic, regular rhythm.  No murmurs, rubs, or gallops. Respiratory: Normal respiratory effort without tachypnea nor retractions. Breath sounds are clear and equal bilaterally. No wheezes/rales/rhonchi. Gastrointestinal: Soft. No distention, no guarding, no rebound. Nontender.    Genitourinary/rectal:Deferred Musculoskeletal: Nontender with normal range of motion in all  extremities. No joint effusions.  No lower extremity tenderness.  No edema. Neurologic:  Normal speech and language. No gross or focal neurologic deficits are appreciated. Skin:  Skin is warm, dry.  Scabs and excoriations over 4 extremities her trunk or back. Psychiatric: Mood and affect are normal. Speech and behavior are normal. Patient exhibits appropriate insight and judgment.  She is somewhat anxious and becomes tearful when we talk about the loss of her son, but does regain control. No suicidal or homicidal ideation.   ____________________________________________  LABS (pertinent positives/negatives)  Labs Reviewed  COMPREHENSIVE METABOLIC PANEL - Abnormal; Notable for the following:       Result Value   Glucose, Bld 105 (*)    Albumin 2.9 (*)    ALT 12 (*)    All other components within normal limits  CBC - Abnormal; Notable for the following:    RBC 3.51 (*)    Hemoglobin 11.4 (*)    HCT 33.9 (*)    RDW 15.7 (*)    Platelets 769 (*)    All other components within normal limits  TROPONIN I - Abnormal; Notable for the following:    Troponin I 0.03 (*)    All other components within normal limits  URINALYSIS, COMPLETE (UACMP) WITH MICROSCOPIC - Abnormal; Notable for the following:    Color, Urine STRAW (*)    APPearance CLEAR (*)    Leukocytes, UA TRACE (*)    Bacteria, UA RARE (*)    Squamous Epithelial / LPF 0-5 (*)    All other components within normal limits  URINE CULTURE  TROPONIN I  TROPONIN I    ____________________________________________    EKG I, Lisa Roca, MD, the attending physician have personally viewed and interpreted all ECGs.  109 bpm. Sinus tachycardia. Left axis deviation. Normal ST and T-wave. Occasional PVC ____________________________________________  RADIOLOGY All Xrays were viewed by me. Imaging interpreted by Radiologist.  Chest x-ray two-view:  IMPRESSION: Slight left base atelectasis. No edema or consolidation.  Cardiac silhouette within normal limits. There is aortic atherosclerosis. __________________________________________  PROCEDURES  Procedure(s) performed: None  Critical Care performed: None  ____________________________________________  ED COURSE / ASSESSMENT AND PLAN  Pertinent labs & imaging results that were available during my care of the patient were reviewed by me and considered in my medical decision making (see chart for details).   Ms. Houck states she essentially feels asymptomatic by the time I see her, after she started received Zofran. She states that she got a little anxious this morning, and wanted to come in for evaluation. On vital signs, her heart rate is around 110, and she states it's actually good for her and that at times she runs 120 and she thinks is due to chronic anxiety.  Denies trouble breathing or shortness of breath or chest pain or palpitations.  EKG was without ischemic findings. Initial troponin is 0.03, will repeat to ensure symptoms of nausea were not related to atypical ACS.  Urinalysis does look consistent with UTI, and patient states that she has indeed actually had frequency without burning. No abdominal pain. No fevers. She states that she does not think she has an allergy to amoxicillin, and does not recognize the medications, Levaquin and moxifloxacin, but states that she does have an allergy to sulfa.  In any case, she states that she's had Cipro and Keflex with no trouble. I will give her dose of Rocephin and prescribed Keflex, and send urine culture.  Denies any history of heart attack, heart disease, or lung disease. O2 sat around 95%. No coughing. Chest x-ray reassuring his symptoms not consistent for me with pulmonary emergency.   Recheck 9:45, feels well.  She does have slight tachycardia, but again states normal for her.  Repeat trop negative.  I think she is ok for discharge as she wants.  Recommend close follow up with  pcp.    CONSULTATIONS:   None   Patient / Family / Caregiver informed of clinical course, medical decision-making process, and agree with plan.   I discussed return precautions, follow-up instructions, and discharge instructions with patient and/or family.   ___________________________________________   FINAL CLINICAL IMPRESSION(S) / ED DIAGNOSES   Final diagnoses:  Urinary tract infection without hematuria, site unspecified  Nausea              Note: This dictation was prepared with Dragon dictation. Any transcriptional errors that result from this process are unintentional    Lisa Roca, MD 07/11/16 [phone]

## 2016-07-11 NOTE — Discharge Instructions (Signed)
Your examine evaluation are reassuring in the emergency department today. As we discussed your heart rate was slightly elevated, but he stated that this is her baseline, and I don't see any additional concerning findings for this at this point time.  You're being treated with Keflex antibiotic for urinary tract infection. Start antibiotic tomorrow.

## 2016-07-11 NOTE — ED Notes (Signed)
Patient is waiting on her ride.  No distress at this time.  Patient given peanut butter, graham crackers, and ginger ale.

## 2016-07-12 LAB — URINE CULTURE

## 2016-07-17 ENCOUNTER — Encounter: Payer: Self-pay | Admitting: Emergency Medicine

## 2016-07-17 ENCOUNTER — Emergency Department
Admission: EM | Admit: 2016-07-17 | Discharge: 2016-07-17 | Disposition: A | Discharge status: Home / Self Care | Payer: Medicare Other | Attending: Emergency Medicine | Admitting: Emergency Medicine

## 2016-07-17 ENCOUNTER — Ambulatory Visit: Payer: Self-pay | Admitting: Family Medicine

## 2016-07-17 DIAGNOSIS — N189 Chronic kidney disease, unspecified: Secondary | ICD-10-CM | POA: Diagnosis not present

## 2016-07-17 DIAGNOSIS — M79604 Pain in right leg: Secondary | ICD-10-CM | POA: Diagnosis not present

## 2016-07-17 DIAGNOSIS — R21 Rash and other nonspecific skin eruption: Secondary | ICD-10-CM | POA: Insufficient documentation

## 2016-07-17 DIAGNOSIS — F17210 Nicotine dependence, cigarettes, uncomplicated: Secondary | ICD-10-CM | POA: Diagnosis not present

## 2016-07-17 DIAGNOSIS — Z79899 Other long term (current) drug therapy: Secondary | ICD-10-CM | POA: Diagnosis not present

## 2016-07-17 DIAGNOSIS — M79605 Pain in left leg: Secondary | ICD-10-CM | POA: Insufficient documentation

## 2016-07-17 MED ORDER — TRIAMCINOLONE ACETONIDE 0.5 % EX OINT: 1.0000 "application " | TOPICAL_OINTMENT | Freq: Two times a day (BID) | CUTANEOUS | 2 refills | Status: DC

## 2016-07-17 MED ORDER — HYDROXYZINE HCL 10 MG PO TABS: 10.0000 mg | ORAL_TABLET | Freq: Three times a day (TID) | ORAL | 0 refills | Status: DC | PRN

## 2016-07-17 NOTE — ED Provider Notes (Signed)
[facility] Emergency Department Provider Note   ____________________________________________   I have reviewed the triage vital signs and the nursing notes.   HISTORY  Chief Complaint Rash and Leg Pain   History limited by: Not Limited   HPI Paula Frank is a 76 y.o. female who presents to the emergency department today because of leg pain and rash. The patient states that these symptoms started 7 years ago. The pain has gotten worse. The patient states that she has seen dermatologists in the past but they have not been able to give her a good answer as to what is causing it. The patient came in today because of the pain. Has been trying motrin and tylenol without relief. The patient has also been trying benadryl for the itchiness.     Past Medical History:  Diagnosis Date  . Anxiety   . Chronic kidney disease   . Chronic pain   . Discoid lupus   . Diverticulitis   . Gastritis   . Lupus (systemic lupus erythematosus) ([place])   . Restless leg   . Seizures [facility])     Patient Active Problem List   Diagnosis Date Noted  . Nausea & vomiting 04/09/2016  . Bronchitis 02/23/2016  . Herpes labialis 02/23/2016  . Abdominal swelling, generalized 01/26/2016  . Bilateral arm weakness 10/12/2015  . Difficulty walking 10/12/2015  . Neck pain 10/12/2015  . Rash 10/12/2015  . Spell of altered consciousness 10/12/2015  . 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 Nausea & vomiting 04/04/2015  . Bipolar I disorder, most recent episode depressed, severe without psychotic features ([place]) 02/16/2015  . Alcohol abuse 02/16/2015  . Alzheimer's dementia 02/16/2015  . 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 Current tobacco use 02/19/2014  . Systemic lupus erythematosus ([place]) 01/14/2014  . Spells 12/01/2013  . Seizure ([place]) 10/30/2013  . Disordered  sleep 10/30/2013  . Adynamia 10/30/2013  . Blood pressure elevated 05/19/2012  . Fatigue 11/20/2011  . Bipolar 1 disorder ([place]) 10/22/2011    Past Surgical History:  Procedure Laterality Date  . ABDOMINAL HYSTERECTOMY    . APPENDECTOMY    . KNEE ARTHROSCOPY W/ MENISCECTOMY    . LAPAROSCOPY    . LITHOTRIPSY    . ROTATOR CUFF REPAIR    . ROTATOR CUFF REPAIR Bilateral    x2  . TUBAL LIGATION      Prior to Admission medications   Medication Sig Start Date End Date Taking? Authorizing Provider  Armodafinil (NUVIGIL) 250 MG tablet Take 250 mg by mouth daily. Reported on 12/29/2015    Historical Provider, MD  buPROPion (WELLBUTRIN XL) 150 MG 24 hr tablet Take 1 tablet (150 mg total) by mouth daily. 10/31/15   Gonzella Lex, MD  cephALEXin (KEFLEX) 500 MG capsule Take 1 capsule (500 mg total) by mouth 2 (two) times daily. 07/11/16   Lisa Roca, MD  clobetasol ointment (TEMOVATE) AB-123456789 % Apply 1 application topically 2 (two) times daily. Apply to arms and shins.    Historical Provider, MD  diphenhydrAMINE (BENADRYL) 25 mg capsule Take 25 mg by mouth 2 (two) times daily as needed. Reported on 12/08/2015    Historical Provider, MD  divalproex (DEPAKOTE) 250 MG DR tablet Take 1 tablet (250 mg total) by mouth every 8 (eight) hours. Patient taking differently: Take 250 mg by mouth daily.  10/31/15   Gonzella Lex, MD  doxepin [facility])  25 MG capsule TAKE 1 CAPSULE BY MOUTH AT BEDTIME 01/04/16   Gonzella Lex, MD  fluocinonide ointment (LIDEX) AB-123456789 % Apply 2 application topically 2 (two) times daily. 03/14/16   Historical Provider, MD  halobetasol (ULTRAVATE) 0.05 % ointment Apply 1 application topically 2 (two) times daily. 06/06/15   Historical Provider, MD  levETIRAcetam (KEPPRA) 500 MG tablet Take 1 tablet (500 mg total) by mouth 2 (two) times daily. 10/31/15   Gonzella Lex, MD  montelukast (SINGULAIR) 10 MG tablet TAKE 1 TABLET (10 MG TOTAL) BY MOUTH DAILY. 01/19/16   Arlis Porta., MD   naproxen (NAPROSYN) 500 MG tablet Take 1 tablet (500 mg total) by mouth 2 (two) times daily with a meal. 06/14/15   Carrie Mew, MD  ondansetron (ZOFRAN) 4 MG tablet Take 1 tablet (4 mg total) by mouth every 8 (eight) hours as needed for nausea or vomiting. 07/11/16   Lisa Roca, MD  oxymetazoline (AFRIN) 0.05 % nasal spray Place 1 spray into both nostrils 2 (two) times daily.    Historical Provider, MD  QUEtiapine (SEROQUEL) 400 MG tablet Take 400 mg by mouth at bedtime. Reported on 12/29/2015    Historical Provider, MD  rOPINIRole (REQUIP) 1 MG tablet TAKE 1 TABLET EVERY MORNING AND 2 TABLETS EVERY NIGHT PATIENT NEEDS APPOINTMENT Patient taking differently: TAKE 1 TABLET EVERY MORNING AND 2 TABLETS EVERY NIGHT 08/15/15   Arlis Porta., MD  triamcinolone ointment (KENALOG) 0.5 % Apply 1 application topically 2 (two) times daily. 01/26/16   Arlis Porta., MD    Allergies Amoxicillin; Ioxaglate; Ivp dye [iodinated diagnostic agents]; Levaquin [levofloxacin in d5w]; Levofloxacin; Moxifloxacin hcl in nacl; Erythromycin; and Sulfa antibiotics  Family History  Problem Relation Age of Onset  . Cancer Mother   . Esophageal cancer Father   . Cancer Son   . Breast cancer Sister     Social History Social History  Substance Use Topics  . Smoking status: Current Every Day Smoker    Packs/day: 1.00    Years: 60.00    Types: Cigarettes  . Smokeless tobacco: Never Used  . Alcohol use 4.2 - 8.4 oz/week    7 - 14 Standard drinks or equivalent per week     Comment: 2 glasses of wine a day    Review of Systems  Constitutional: Negative for fever. Cardiovascular: Negative for chest pain. Respiratory: Negative for shortness of breath. Gastrointestinal: Negative for abdominal pain, vomiting and diarrhea. Skin: Positive for rash. Neurological: Negative for headaches, focal weakness or numbness.  10-point ROS otherwise  negative.  ____________________________________________   PHYSICAL EXAM:  VITAL SIGNS: ED Triage Vitals  Enc Vitals Group     BP 07/17/16 0805 119/69     Pulse Rate 07/17/16 0805 (!) 112     Resp 07/17/16 0805 18     Temp 07/17/16 0805 98.6 F (37 C)     Temp Source 07/17/16 0805 Oral     SpO2 07/17/16 0805 97 %     Weight 07/17/16 0803 140 lb (63.5 kg)     Height 07/17/16 0803 5\' 6"  (1.676 m)   Constitutional: Alert and oriented. Well appearing and in no distress. Eyes: Conjunctivae are normal. Normal extraocular movements. ENT   Head: Normocephalic and atraumatic.   Nose: No congestion/rhinnorhea.   Mouth/Throat: Mucous membranes are moist.   Neck: No stridor. Hematological/Lymphatic/Immunilogical: No cervical lymphadenopathy. Cardiovascular: Normal rate, regular rhythm.  No murmurs, rubs, or gallops.  Respiratory: Normal respiratory effort  without tachypnea nor retractions. Breath sounds are clear and equal bilaterally. No wheezes/rales/rhonchi. Gastrointestinal: Soft and non tender. No rebound. No guarding.  Genitourinary: Deferred Musculoskeletal: Normal range of motion in all extremities. No lower extremity edema. Neurologic:  Normal speech and language. No gross focal neurologic deficits are appreciated.  Skin:  Diffuse rash with excoriation and scabbing over extremities. No foul odor. No pus. Psychiatric: Mood and affect are normal. Speech and behavior are normal. Patient exhibits appropriate insight and judgment.  ____________________________________________    LABS (pertinent positives/negatives)  None  ____________________________________________   EKG  None  ____________________________________________    RADIOLOGY  None   ____________________________________________   PROCEDURES  Procedures  ____________________________________________   INITIAL IMPRESSION / ASSESSMENT AND PLAN / ED COURSE  Pertinent labs & imaging  results that were available during my care of the patient were reviewed by me and considered in my medical decision making (see chart for details).  Patient presents to the emergency department today with worsening rash and pain for the past 7 years. On exam patient does have a diffuse rash with some scabbing over her extremities. No signs of overlying or secondary infection. Discussed with patient importance of following up with dermatology. Patient states that she did lose her cream because her luggage was lost. Per chart review she is on a steroid cream. Will give patient a prescription for steroid ointment as well as Atarax for itchiness.  ____________________________________________   FINAL CLINICAL IMPRESSION(S) / ED DIAGNOSES  Final diagnoses:  Rash     Note: This dictation was prepared with Dragon dictation. Any transcriptional errors that result from this process are unintentional     Nance Pear, MD 07/17/16 0830

## 2016-07-17 NOTE — Discharge Instructions (Signed)
Please seek medical attention for any high fevers, chest pain, shortness of breath, change in behavior, persistent vomiting, bloody stool or any other new or concerning symptoms.  

## 2016-07-17 NOTE — ED Triage Notes (Signed)
Has ulcers and rash all over.  Says her legs are bleeding and her legs are painful today

## 2016-07-23 ENCOUNTER — Ambulatory Visit (INDEPENDENT_AMBULATORY_CARE_PROVIDER_SITE_OTHER): Payer: Medicare Other | Admitting: Family Medicine

## 2016-07-23 ENCOUNTER — Encounter: Payer: Self-pay | Admitting: Family Medicine

## 2016-07-23 VITALS — BP 113/54 | HR 111 | Temp 97.8°F | Resp 16 | Ht 66.0 in | Wt 133.0 lb

## 2016-07-23 DIAGNOSIS — J40 Bronchitis, not specified as acute or chronic: Secondary | ICD-10-CM | POA: Diagnosis not present

## 2016-07-23 DIAGNOSIS — R569 Unspecified convulsions: Secondary | ICD-10-CM

## 2016-07-23 DIAGNOSIS — F314 Bipolar disorder, current episode depressed, severe, without psychotic features: Secondary | ICD-10-CM

## 2016-07-23 DIAGNOSIS — L309 Dermatitis, unspecified: Secondary | ICD-10-CM | POA: Diagnosis not present

## 2016-07-23 MED ORDER — DM-GUAIFENESIN ER 30-600 MG PO TB12: 1.0000 | ORAL_TABLET | Freq: Two times a day (BID) | ORAL | 1 refills | Status: DC

## 2016-07-23 MED ORDER — AZITHROMYCIN 250 MG PO TABS: ORAL_TABLET | ORAL | 0 refills | Status: DC

## 2016-07-23 MED ORDER — LORATADINE 10 MG PO TABS: 10.0000 mg | ORAL_TABLET | Freq: Every day | ORAL | 11 refills | Status: AC

## 2016-07-23 NOTE — Progress Notes (Signed)
Name: Paula Frank   MRN: [id number]    DOB: June 08, 1941   Date:07/23/2016       Progress Note  Subjective  Chief Complaint  Chief Complaint  Patient presents with  . Hospitalization Follow-up  . Referral  . Manic Behavior  . Cough    HPI Here for f/u of her bipolar disorder, seizure disorder, dermatitis.  She has had a cough for past several weeks.  Cough is getting worse and is now becoming more productive.    She had lived in [place], [place]. Close to her son, but she had a falling out with her assisted living manager so she left and came back to [facility].  Needs referral back to Psychiatry and Neurology.  Needs home health referral.   No problem-specific Assessment & Plan notes found for this encounter.   Past Medical History:  Diagnosis Date  . Anxiety   . Chronic kidney disease   . Chronic pain   . Discoid lupus   . Diverticulitis   . Gastritis   . Lupus (systemic lupus erythematosus) ([place])   . Restless leg   . Seizures ([place])     Past Surgical History:  Procedure Laterality Date  . ABDOMINAL HYSTERECTOMY    . APPENDECTOMY    . KNEE ARTHROSCOPY W/ MENISCECTOMY    . LAPAROSCOPY    . LITHOTRIPSY    . ROTATOR CUFF REPAIR    . ROTATOR CUFF REPAIR Bilateral    x2  . TUBAL LIGATION      Family History  Problem Relation Age of Onset  . Cancer Mother   . Esophageal cancer Father   . Cancer Son   . Breast cancer Sister     Social History   Social History  . Marital status: Widowed    Spouse name: N/A  . Number of children: 3  . Years of education: N/A   Occupational History  . retired Therapist, sports    Social History Main Topics  . Smoking status: Current Every Day Smoker    Packs/day: 1.00    Years: 60.00    Types: Cigarettes  . Smokeless tobacco: Never Used  . Alcohol use 4.2 - 8.4 oz/week    7 - 14 Standard drinks or equivalent per week     Comment: 2 glasses of wine a day  . Drug use: No  . Sexual activity: Not on file   Other Topics Concern  . Not on  file   Social History Narrative   Lives with friend.   She retired Therapist, sports and worked in psychiatry.           Current Outpatient Prescriptions:  .  Armodafinil (NUVIGIL) 250 MG tablet, Take 250 mg by mouth daily. Reported on 12/29/2015, Disp: , Rfl:  .  buPROPion (WELLBUTRIN XL) 150 MG 24 hr tablet, Take 1 tablet (150 mg total) by mouth daily., Disp: 30 tablet, Rfl: 0 .  cephALEXin (KEFLEX) 500 MG capsule, Take 1 capsule (500 mg total) by mouth 2 (two) times daily., Disp: 14 capsule, Rfl: 0 .  clobetasol ointment (TEMOVATE) 5.39 %, Apply 1 application topically 2 (two) times daily. Apply to arms and shins., Disp: , Rfl:  .  diphenhydrAMINE (BENADRYL) 25 mg capsule, Take 25 mg by mouth 2 (two) times daily as needed. Reported on 12/08/2015, Disp: , Rfl:  .  divalproex (DEPAKOTE) 250 MG DR tablet, Take 1 tablet (250 mg total) by mouth every 8 (eight) hours. (Patient taking differently: Take 250 mg by mouth daily. ),  Disp: 90 tablet, Rfl: 0 .  doxepin (SINEQUAN) 25 MG capsule, TAKE 1 CAPSULE BY MOUTH AT BEDTIME, Disp: 30 capsule, Rfl: 0 .  fluocinonide ointment (LIDEX) 1.60 %, Apply 2 application topically 2 (two) times daily., Disp: , Rfl: 3 .  halobetasol (ULTRAVATE) 0.05 % ointment, Apply 1 application topically 2 (two) times daily., Disp: , Rfl: 5 .  hydrOXYzine (ATARAX/VISTARIL) 10 MG tablet, Take 1 tablet (10 mg total) by mouth 3 (three) times daily as needed for itching., Disp: 30 tablet, Rfl: 0 .  levETIRAcetam (KEPPRA) 500 MG tablet, Take 1 tablet (500 mg total) by mouth 2 (two) times daily., Disp: 60 tablet, Rfl: 0 .  montelukast (SINGULAIR) 10 MG tablet, TAKE 1 TABLET (10 MG TOTAL) BY MOUTH DAILY., Disp: 30 tablet, Rfl: 6 .  naproxen (NAPROSYN) 500 MG tablet, Take 1 tablet (500 mg total) by mouth 2 (two) times daily with a meal., Disp: 20 tablet, Rfl: 0 .  ondansetron (ZOFRAN) 4 MG tablet, Take 1 tablet (4 mg total) by mouth every 8 (eight) hours as needed for nausea or vomiting., Disp: 10  tablet, Rfl: 0 .  oxymetazoline (AFRIN) 0.05 % nasal spray, Place 1 spray into both nostrils 2 (two) times daily., Disp: , Rfl:  .  QUEtiapine (SEROQUEL) 400 MG tablet, Take 400 mg by mouth at bedtime. Reported on 12/29/2015, Disp: , Rfl:  .  rOPINIRole (REQUIP) 1 MG tablet, TAKE 1 TABLET EVERY MORNING AND 2 TABLETS EVERY NIGHT PATIENT NEEDS APPOINTMENT (Patient taking differently: TAKE 1 TABLET EVERY MORNING AND 2 TABLETS EVERY NIGHT), Disp: 90 tablet, Rfl: 0 .  triamcinolone ointment (KENALOG) 0.5 %, Apply 1 application topically 2 (two) times daily., Disp: 60 g, Rfl: 2 .  azithromycin (ZITHROMAX Z-PAK) 250 MG tablet, Take 2 tabs on day 1, then 1 tablet on days 2-5., Disp: 6 each, Rfl: 0 .  dextromethorphan-guaiFENesin (MUCINEX DM) 30-600 MG 12hr tablet, Take 1 tablet by mouth 2 (two) times daily., Disp: 20 tablet, Rfl: 1 .  loratadine (CLARITIN) 10 MG tablet, Take 1 tablet (10 mg total) by mouth daily., Disp: 30 tablet, Rfl: 11  Allergies  Allergen Reactions  . Amoxicillin Other (See Comments)  . Ioxaglate     Other reaction(s): Unknown  . Ivp Dye [Iodinated Diagnostic Agents]     Other reaction(s): Unknown  . Levaquin [Levofloxacin In D5w]   . Levofloxacin     Other reaction(s): Unknown  . Moxifloxacin Hcl In Nacl     Other reaction(s): Unknown  . Erythromycin Rash    Canker sore  . Sulfa Antibiotics Rash    Other reaction(s): Unknown     ROS    Objective  Vitals:   07/23/16 1428  BP: (!) 113/54  Pulse: (!) 111  Resp: 16  Temp: 97.8 F (36.6 C)  TempSrc: Oral  Weight: 133 lb (60.3 kg)  Height: _0  (1.676 m)    Physical Exam  Constitutional: She is oriented to person, place, and time. She appears distressed (appears to feel ill).  HENT:  Head: Normocephalic and atraumatic.  Right Ear: External ear normal.  Left Ear: External ear normal.  Nose: Mucosal edema and rhinorrhea present. Right sinus exhibits no maxillary sinus tenderness and no frontal sinus  tenderness. Left sinus exhibits no maxillary sinus tenderness and no frontal sinus tenderness.  Mouth/Throat: Oropharynx is clear and moist.  Eyes: Conjunctivae and EOM are normal. Pupils are equal, round, and reactive to light. No scleral icterus.  Cardiovascular: Regular rhythm and normal heart  sounds.  Tachycardia present.  Exam reveals no gallop and no friction rub.   No murmur heard. Pulmonary/Chest: Effort normal. She has no wheezes. She has no rales.  Coarse rhonchi throughout.  Musculoskeletal: She exhibits no edema.  Lymphadenopathy:    She has no cervical adenopathy.  Neurological: She is alert and oriented to person, place, and time.  Vitals reviewed.      Recent Results (from the past 2160 hour(s))  Comprehensive metabolic panel     Status: Abnormal   Collection Time: 07/11/16  5:58 AM  Result Value Ref Range   Sodium 139 135 - 145 mmol/L   Potassium 4.5 3.5 - 5.1 mmol/L   Chloride 105 101 - 111 mmol/L   CO2 26 22 - 32 mmol/L   Glucose, Bld 105 (H) 65 - 99 mg/dL   BUN 12 6 - 20 mg/dL   Creatinine, Ser 0.73 0.44 - 1.00 mg/dL   Calcium 8.9 8.9 - 10.3 mg/dL   Total Protein 7.4 6.5 - 8.1 g/dL   Albumin 2.9 (L) 3.5 - 5.0 g/dL   AST 17 15 - 41 U/L   ALT 12 (L) 14 - 54 U/L   Alkaline Phosphatase 108 38 - 126 U/L   Total Bilirubin 0.3 0.3 - 1.2 mg/dL   GFR calc non Af Amer >60 >60 mL/min   GFR calc Af Amer >60 >60 mL/min    Comment: (NOTE) The eGFR has been calculated using the CKD EPI equation. This calculation has not been validated in all clinical situations. eGFR's persistently <60 mL/min signify possible Chronic Kidney Disease.    Anion gap 8 5 - 15  CBC     Status: Abnormal   Collection Time: 07/11/16  5:58 AM  Result Value Ref Range   WBC 8.2 3.6 - 11.0 K/uL   RBC 3.51 (L) 3.80 - 5.20 MIL/uL   Hemoglobin 11.4 (L) 12.0 - 16.0 g/dL   HCT 33.9 (L) 35.0 - 47.0 %   MCV 96.4 80.0 - 100.0 fL   MCH 32.5 26.0 - 34.0 pg   MCHC 33.7 32.0 - 36.0 g/dL   RDW 15.7  (H) 11.5 - 14.5 %   Platelets 769 (H) 150 - 440 K/uL  Troponin I     Status: Abnormal   Collection Time: 07/11/16  5:58 AM  Result Value Ref Range   Troponin I 0.03 (HH) <0.03 ng/mL    Comment: CRITICAL RESULT CALLED TO, READ BACK BY AND VERIFIED WITH  KALEY WALKER AT 07/11/16 SDR   Urinalysis, Complete w Microscopic     Status: Abnormal   Collection Time: 07/11/16  6:36 AM  Result Value Ref Range   Color, Urine STRAW (A) YELLOW   APPearance CLEAR (A) CLEAR   Specific Gravity, Urine 1.008 1.005 - 1.030   pH 5.0 5.0 - 8.0   Glucose, UA NEGATIVE NEGATIVE mg/dL   Hgb urine dipstick NEGATIVE NEGATIVE   Bilirubin Urine NEGATIVE NEGATIVE   Ketones, ur NEGATIVE NEGATIVE mg/dL   Protein, ur NEGATIVE NEGATIVE mg/dL   Nitrite NEGATIVE NEGATIVE   Leukocytes, UA TRACE (A) NEGATIVE   RBC / HPF 0-5 0 - 5 RBC/hpf   WBC, UA 6-30 0 - 5 WBC/hpf   Bacteria, UA RARE (A) NONE SEEN   Squamous Epithelial / LPF 0-5 (A) NONE SEEN   Mucous PRESENT   Urine culture     Status: Abnormal   Collection Time: 07/11/16  6:36 AM  Result Value Ref Range   Specimen Description URINE, RANDOM  Special Requests NONE    Culture MULTIPLE SPECIES PRESENT, SUGGEST RECOLLECTION (A)    Report Status 07/12/2016 FINAL   Troponin I     Status: None   Collection Time: 07/11/16  9:00 AM  Result Value Ref Range   Troponin I <0.03 <0.03 ng/mL     Assessment & Plan  Problem List Items Addressed This Visit      Respiratory   Bronchitis   Relevant Medications   azithromycin (ZITHROMAX Z-PAK) 250 MG tablet   dextromethorphan-guaiFENesin (MUCINEX DM) 30-600 MG 12hr tablet   loratadine (CLARITIN) 10 MG tablet     Musculoskeletal and Integument   Dermatitis     Other   Bipolar I disorder, most recent episode depressed, severe without psychotic features [facility]) - Primary   Relevant Orders   Ambulatory referral to Psychiatry   Ambulatory referral to Home Health   Seizure [facility])   Relevant Orders   Ambulatory referral  to Neurology      Meds ordered this encounter  Medications  . azithromycin (ZITHROMAX Z-PAK) 250 MG tablet    Sig: Take 2 tabs on day 1, then 1 tablet on days 2-5.    Dispense:  6 each    Refill:  0  . dextromethorphan-guaiFENesin (MUCINEX DM) 30-600 MG 12hr tablet    Sig: Take 1 tablet by mouth 2 (two) times daily.    Dispense:  20 tablet    Refill:  1  . loratadine (CLARITIN) 10 MG tablet    Sig: Take 1 tablet (10 mg total) by mouth daily.    Dispense:  30 tablet    Refill:  11   1. Bipolar I disorder, most recent episode depressed, severe without psychotic features ([place])  - Ambulatory referral to Psychiatry - Ambulatory referral to [facility]  2. Seizure [facility])  - Ambulatory referral to Neurology  3. Dermatitis   4. Bronchitis  - azithromycin (ZITHROMAX Z-PAK) 250 MG tablet; Take 2 tabs on day 1, then 1 tablet on days 2-5.  Dispense: 6 each; Refill: 0 - dextromethorphan-guaiFENesin (MUCINEX DM) 30-600 MG 12hr tablet; Take 1 tablet by mouth 2 (two) times daily.  Dispense: 20 tablet; Refill: 1 - loratadine (CLARITIN) 10 MG tablet; Take 1 tablet (10 mg total) by mouth daily.  Dispense: 30 tablet; Refill: 11

## 2016-07-24 ENCOUNTER — Telehealth: Payer: Self-pay | Admitting: Family Medicine

## 2016-07-24 NOTE — Telephone Encounter (Signed)
R/t call to patient and let her know we have no knowledge of person who might have called her. She can track by caller id or await call back.[place]

## 2016-07-24 NOTE — Telephone Encounter (Signed)
Pt said home health called her but they were disconnected.  She asked if some one could reach out to them again to set something up.  Her call back number is [phone].

## 2016-07-27 ENCOUNTER — Telehealth: Payer: Self-pay

## 2016-07-27 DIAGNOSIS — J40 Bronchitis, not specified as acute or chronic: Secondary | ICD-10-CM | POA: Diagnosis not present

## 2016-07-27 DIAGNOSIS — F314 Bipolar disorder, current episode depressed, severe, without psychotic features: Secondary | ICD-10-CM | POA: Diagnosis not present
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
## 2016-07-27 NOTE — Telephone Encounter (Signed)
Jeffe from [facility] called reporting that he just completed a in house visit and more then 1/3 of her medication was empty bottle.  He was going off of the medication list that was sent from [facility].  Please call Jeffe back to help him with medication list.

## 2016-07-27 NOTE — Telephone Encounter (Signed)
Returned call to [facility] and left message for return call.[place]

## 2016-07-30 NOTE — Telephone Encounter (Signed)
Noted-jh 

## 2016-07-31 DIAGNOSIS — F314 Bipolar disorder, current episode depressed, severe, without psychotic features: Secondary | ICD-10-CM | POA: Diagnosis not present

## 2016-07-31 DIAGNOSIS — J40 Bronchitis, not specified as acute or chronic: Secondary | ICD-10-CM | POA: Diagnosis not present

## 2016-08-01 DIAGNOSIS — F314 Bipolar disorder, current episode depressed, severe, without psychotic features: Secondary | ICD-10-CM | POA: Diagnosis not present

## 2016-08-01 DIAGNOSIS — J40 Bronchitis, not specified as acute or chronic: Secondary | ICD-10-CM | POA: Diagnosis not present

## 2016-08-02 DIAGNOSIS — J40 Bronchitis, not specified as acute or chronic: Secondary | ICD-10-CM | POA: Diagnosis not present

## 2016-08-02 DIAGNOSIS — F314 Bipolar disorder, current episode depressed, severe, without psychotic features: Secondary | ICD-10-CM | POA: Diagnosis not present

## 2016-08-03 ENCOUNTER — Telehealth: Payer: Self-pay | Admitting: *Deleted
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
## 2016-08-06 DIAGNOSIS — J40 Bronchitis, not specified as acute or chronic: Secondary | ICD-10-CM | POA: Diagnosis not present

## 2016-08-06 DIAGNOSIS — F314 Bipolar disorder, current episode depressed, severe, without psychotic features: Secondary | ICD-10-CM | POA: Diagnosis not present

## 2016-08-06 NOTE — Telephone Encounter (Signed)
Noted-jh 

## 2016-08-07 DIAGNOSIS — Z961 Presence of intraocular lens: Secondary | ICD-10-CM | POA: Diagnosis not present

## 2016-08-08 ENCOUNTER — Telehealth: Payer: Self-pay | Admitting: *Deleted

## 2016-08-08 DIAGNOSIS — J40 Bronchitis, not specified as acute or chronic: Secondary | ICD-10-CM | POA: Diagnosis not present

## 2016-08-08 DIAGNOSIS — F314 Bipolar disorder, current episode depressed, severe, without psychotic features: Secondary | ICD-10-CM | POA: Diagnosis not present

## 2016-08-08 NOTE — Telephone Encounter (Signed)
Verbal order given for home aide to assist with baths 2 x per week.

## 2016-08-09 ENCOUNTER — Telehealth: Payer: Self-pay | Admitting: Family Medicine

## 2016-08-09 NOTE — Telephone Encounter (Signed)
Pt called to check the status of referral to Dr. Melrose Nakayama.  Her call back number is [phone]

## 2016-08-09 NOTE — Telephone Encounter (Signed)
Left detailed message and Thayer Headings gave a verbal to patient and also number was given to patient DR. Potter's number [phone].

## 2016-08-10 DIAGNOSIS — F314 Bipolar disorder, current episode depressed, severe, without psychotic features: Secondary | ICD-10-CM | POA: Diagnosis not present

## 2016-08-10 DIAGNOSIS — J40 Bronchitis, not specified as acute or chronic: Secondary | ICD-10-CM | POA: Diagnosis not present

## 2016-08-13 DIAGNOSIS — F314 Bipolar disorder, current episode depressed, severe, without psychotic features: Secondary | ICD-10-CM | POA: Diagnosis not present

## 2016-08-13 DIAGNOSIS — J40 Bronchitis, not specified as acute or chronic: Secondary | ICD-10-CM | POA: Diagnosis not present

## 2016-08-13 NOTE — Telephone Encounter (Signed)
Agree-jh 

## 2016-08-15 DIAGNOSIS — F314 Bipolar disorder, current episode depressed, severe, without psychotic features: Secondary | ICD-10-CM | POA: Diagnosis not present

## 2016-08-15 DIAGNOSIS — J40 Bronchitis, not specified as acute or chronic: Secondary | ICD-10-CM | POA: Diagnosis not present

## 2016-08-16 DIAGNOSIS — J40 Bronchitis, not specified as acute or chronic: Secondary | ICD-10-CM | POA: Diagnosis not present

## 2016-08-16 DIAGNOSIS — F314 Bipolar disorder, current episode depressed, severe, without psychotic features: Secondary | ICD-10-CM | POA: Diagnosis not present

## 2016-08-17 DIAGNOSIS — F314 Bipolar disorder, current episode depressed, severe, without psychotic features: Secondary | ICD-10-CM | POA: Diagnosis not present

## 2016-08-17 DIAGNOSIS — J40 Bronchitis, not specified as acute or chronic: Secondary | ICD-10-CM | POA: Diagnosis not present

## 2016-08-20 DIAGNOSIS — L309 Dermatitis, unspecified: Secondary | ICD-10-CM | POA: Diagnosis not present

## 2016-08-20 DIAGNOSIS — L299 Pruritus, unspecified: Secondary | ICD-10-CM | POA: Diagnosis not present

## 2016-08-21 DIAGNOSIS — J40 Bronchitis, not specified as acute or chronic: Secondary | ICD-10-CM | POA: Diagnosis not present

## 2016-08-21 DIAGNOSIS — F314 Bipolar disorder, current episode depressed, severe, without psychotic features: Secondary | ICD-10-CM | POA: Diagnosis not present

## 2016-08-22 IMAGING — CT CT ABD-PELV W/ CM
1 of 3 series · 14 of 32 positions shown, 19 images · IV contrast (omnipaque)
Comparison: 12/09/2013

CLINICAL DATA: Constipation and right groin swelling for 1 month,
history of lupus and hysterectomy, patient was pre-medicated for
contrast allergy history

EXAM:
CT ABDOMEN AND PELVIS WITH CONTRAST
TECHNIQUE: Multidetector CT imaging of the abdomen and pelvis was performed
using the standard protocol following bolus administration of
intravenous contrast.
CONTRAST:  100 mL Omnipaque 300

[Series 2: routine abd pel with · axial · 0.65mm/px · z∈[-1356,-946]mm · 14 of 92 slices shown, 19 images]
[im 5/92  soft-tissue]
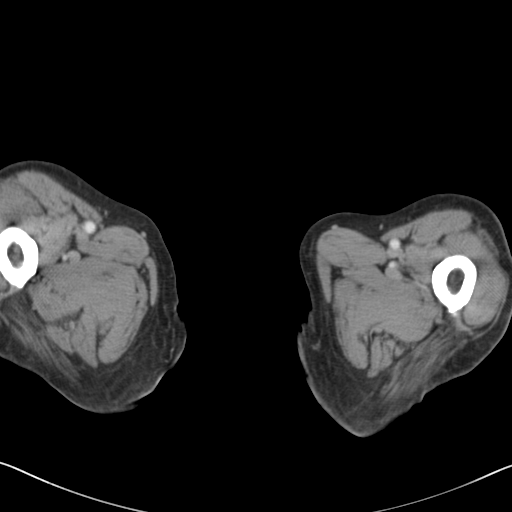
[im 5/92  bone]
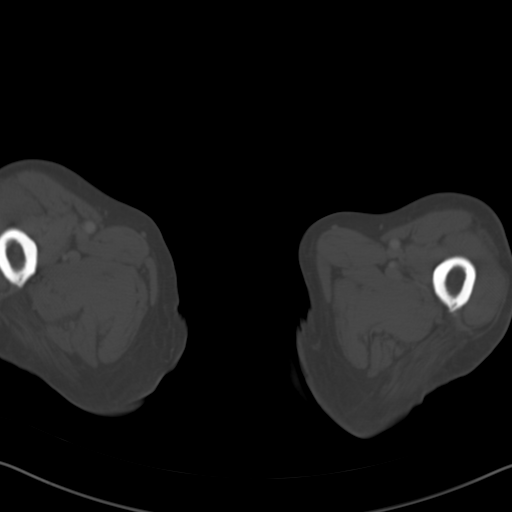
[im 15/92  soft-tissue]
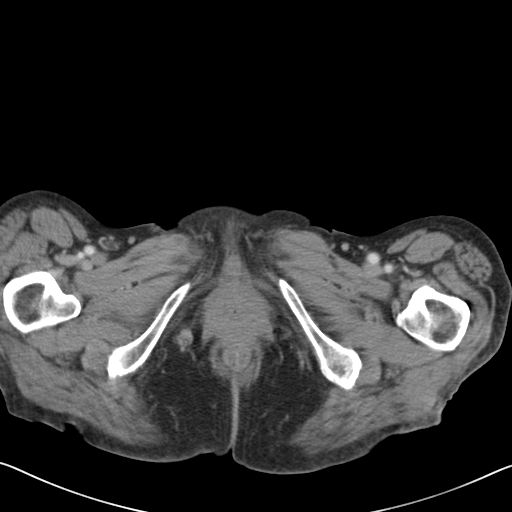
[im 20/92  soft-tissue]
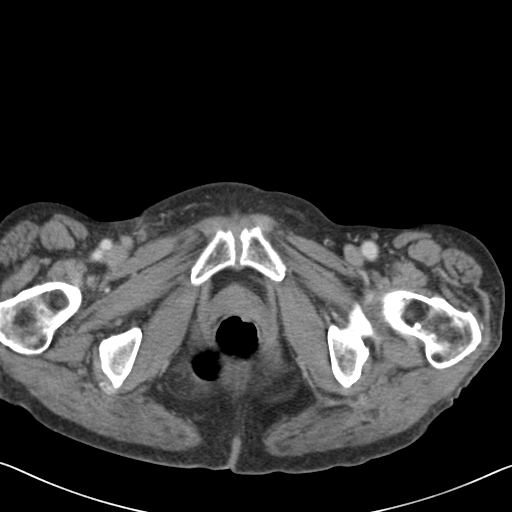
[im 24/92  soft-tissue]
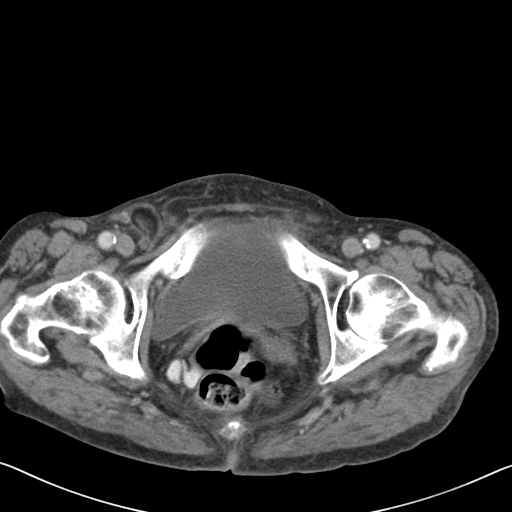
[im 34/92  soft-tissue]
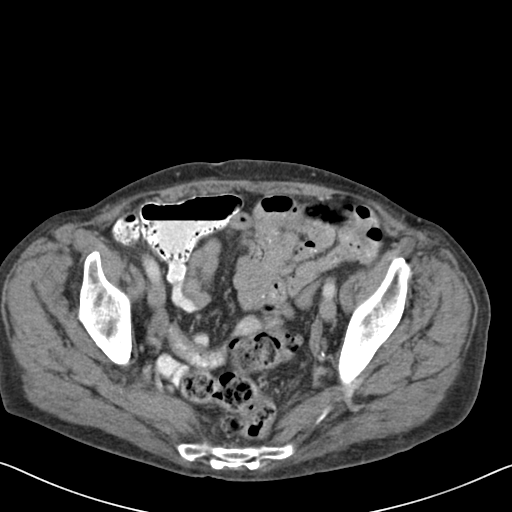
[im 39/92  soft-tissue]
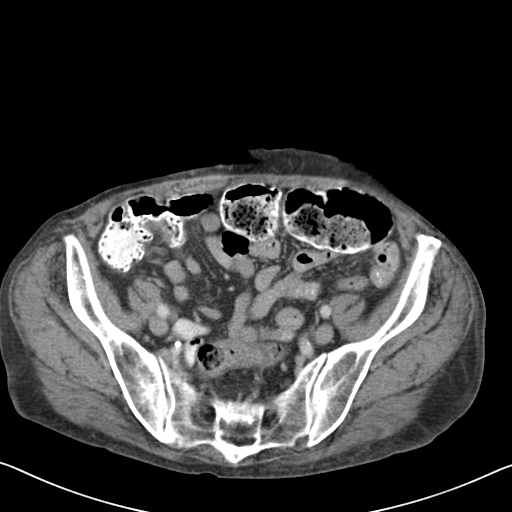
[im 48/92  soft-tissue]
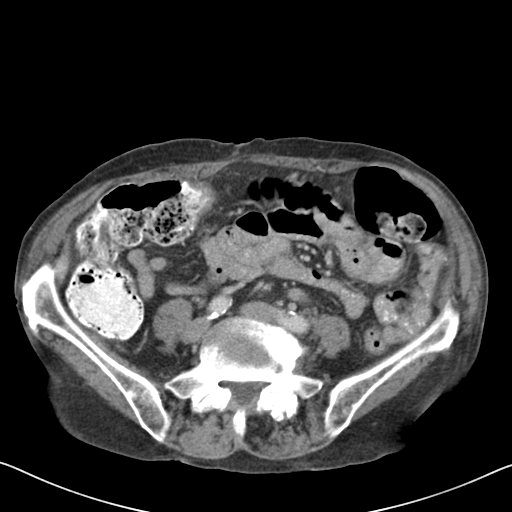
[im 53/92  soft-tissue]
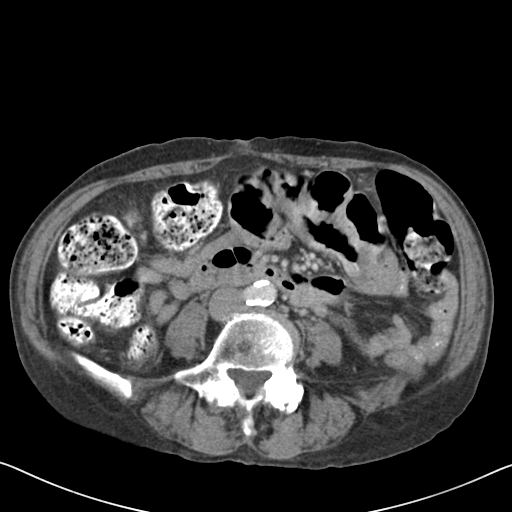
[im 58/92  soft-tissue]
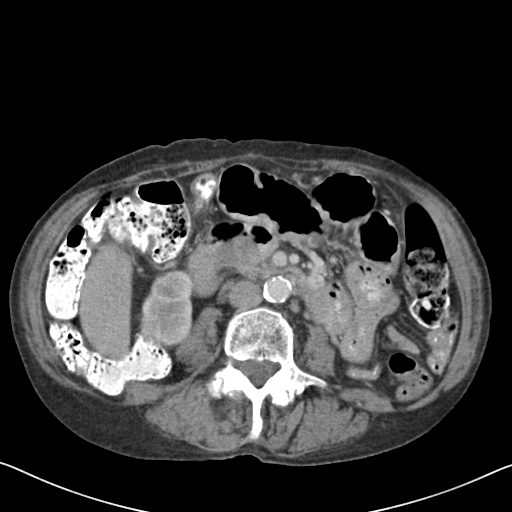
[im 58/92  bone]
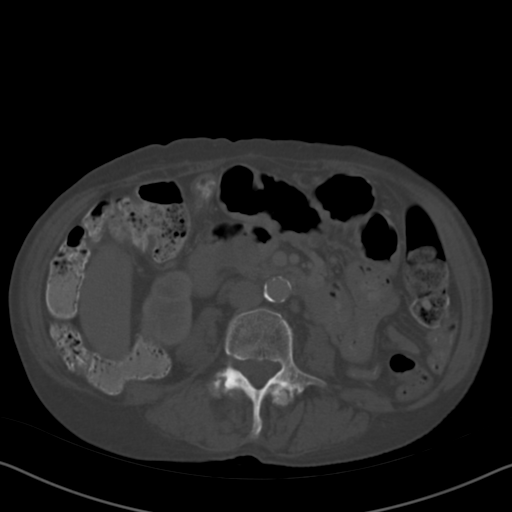
[im 68/92  soft-tissue]
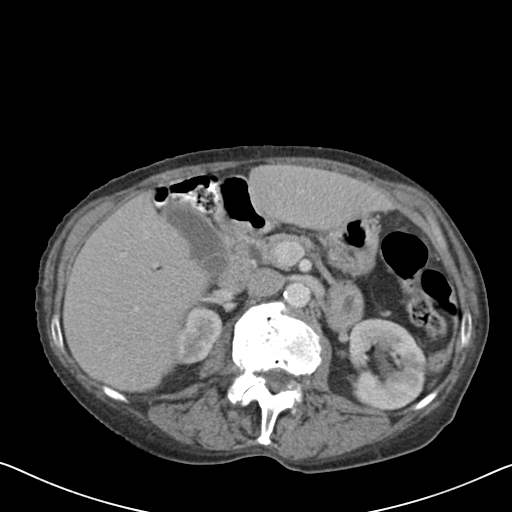
[im 72/92  soft-tissue]
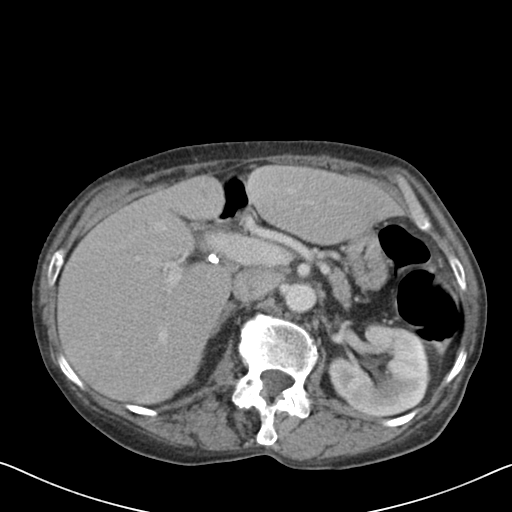
[im 72/92  lung]
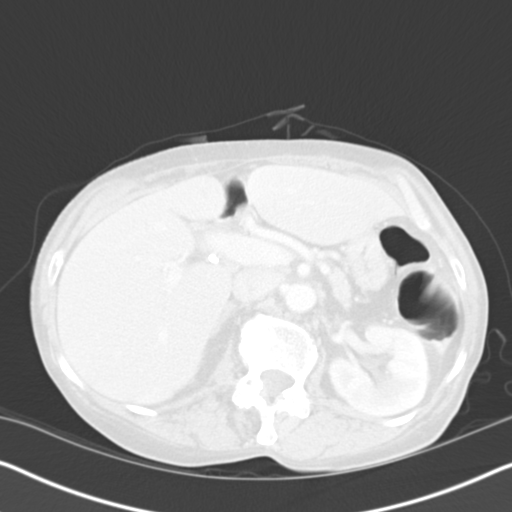
[im 77/92  soft-tissue]
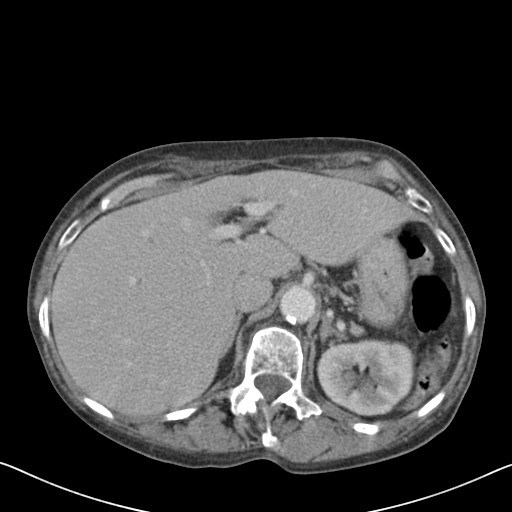
[im 77/92  lung]
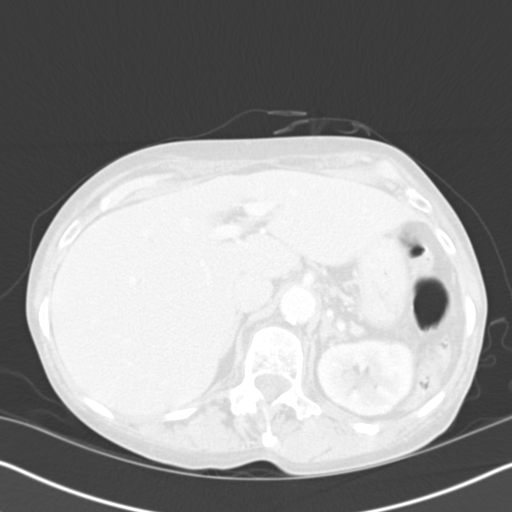
[im 82/92  lung]
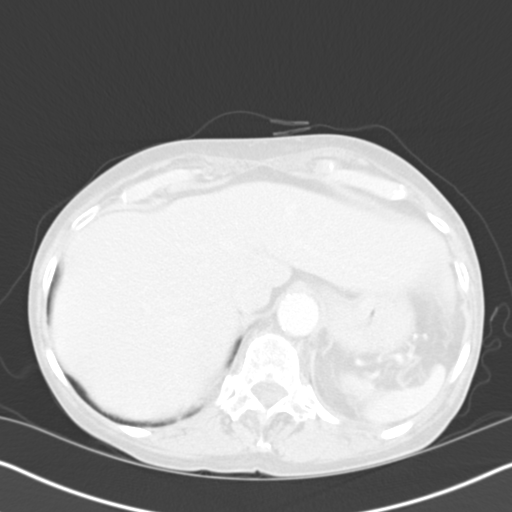
[im 87/92  soft-tissue]
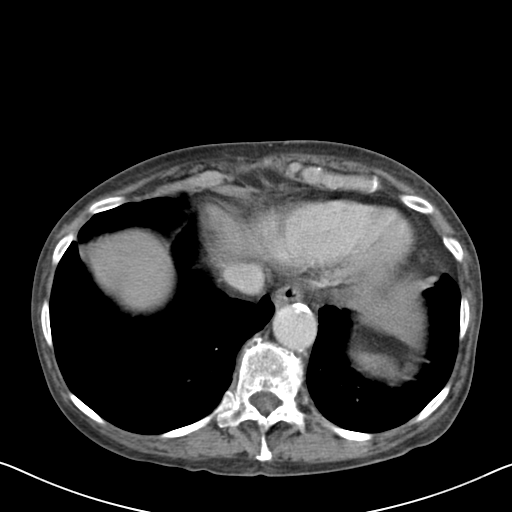
[im 87/92  lung]
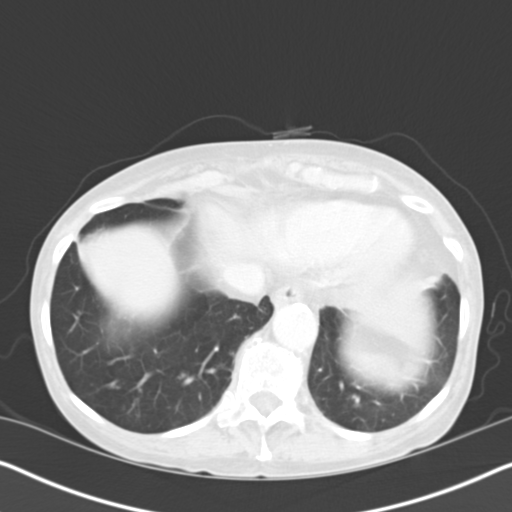

[14 of 32 positions shown; findings below may reference images not displayed]

FINDINGS: Mild focal fatty infiltration adjacent to the gallbladder fossa
within the liver. Liver otherwise normal. Small calcification in the
periportal region stable. Gallbladder normal. Spleen normal.

Adrenal glands normal. 1 cm stable left renal cyst. Right kidney
normal.

Nonobstructive bowel gas pattern. Status post hysterectomy. No free
fluid. Bladder normal. There is a right inguinal hernia. Cecum lies
adjacent to the hernia with a tiny contrast filled portion of cecum
extending into the upper inguinal canal. There is an approximately
10-15 mm fluid collection within the inguinal canal with little if
any inflammatory change. There are bilateral inguinal lymph nodes.
these are slightly larger and more numerous on the right but still
well within normal limits.

Heavy calcification of the abdominal aorta. Visualized portions of
the lung bases clear. No acute osseous abnormalities.

Moderate to large volume of stool throughout the colon. Mild
diverticulosis of the sigmoid colon.
IMPRESSION: Right inguinal hernia contains a very small portion of cecum,
possibly with mild associated inflammatory change but without
obstruction and without definite evidence of strangulation.
Constipation.

## 2016-09-05 DIAGNOSIS — R262 Difficulty in walking, not elsewhere classified: Secondary | ICD-10-CM | POA: Diagnosis not present

## 2016-09-05 DIAGNOSIS — M79605 Pain in left leg: Secondary | ICD-10-CM | POA: Diagnosis not present

## 2016-09-05 DIAGNOSIS — R4189 Other symptoms and signs involving cognitive functions and awareness: Secondary | ICD-10-CM | POA: Diagnosis not present

## 2016-09-05 DIAGNOSIS — M79604 Pain in right leg: Secondary | ICD-10-CM | POA: Diagnosis not present

## 2016-09-05 DIAGNOSIS — G479 Sleep disorder, unspecified: Secondary | ICD-10-CM | POA: Diagnosis not present

## 2016-09-05 DIAGNOSIS — F39 Unspecified mood [affective] disorder: Secondary | ICD-10-CM | POA: Diagnosis not present

## 2016-09-05 DIAGNOSIS — R413 Other amnesia: Secondary | ICD-10-CM | POA: Diagnosis not present

## 2016-09-05 DIAGNOSIS — R569 Unspecified convulsions: Secondary | ICD-10-CM | POA: Diagnosis not present

## 2016-09-05 DIAGNOSIS — R21 Rash and other nonspecific skin eruption: Secondary | ICD-10-CM | POA: Diagnosis not present

## 2016-10-03 DIAGNOSIS — F0281 Dementia in other diseases classified elsewhere with behavioral disturbance: Secondary | ICD-10-CM | POA: Diagnosis not present

## 2016-10-09 ENCOUNTER — Ambulatory Visit (INDEPENDENT_AMBULATORY_CARE_PROVIDER_SITE_OTHER): Payer: Medicare Other | Admitting: Family Medicine

## 2016-10-09 ENCOUNTER — Ambulatory Visit
Admission: RE | Admit: 2016-10-09 | Discharge: 2016-10-09 | Disposition: A | Discharge status: Home / Self Care | Payer: Medicare Other | Source: Ambulatory Visit | Attending: Family Medicine | Admitting: Family Medicine

## 2016-10-09 ENCOUNTER — Encounter: Payer: Self-pay | Admitting: Family Medicine

## 2016-10-09 VITALS — BP 139/70 | HR 106 | Temp 97.4°F | Resp 16 | Ht 66.0 in | Wt 132.0 lb

## 2016-10-09 DIAGNOSIS — R918 Other nonspecific abnormal finding of lung field: Secondary | ICD-10-CM | POA: Insufficient documentation

## 2016-10-09 DIAGNOSIS — Z720 Tobacco use: Secondary | ICD-10-CM | POA: Diagnosis not present

## 2016-10-09 DIAGNOSIS — R05 Cough: Secondary | ICD-10-CM | POA: Diagnosis not present

## 2016-10-09 DIAGNOSIS — G43A0 Cyclical vomiting, not intractable: Secondary | ICD-10-CM | POA: Diagnosis not present

## 2016-10-09 DIAGNOSIS — J189 Pneumonia, unspecified organism: Secondary | ICD-10-CM

## 2016-10-09 DIAGNOSIS — R0982 Postnasal drip: Secondary | ICD-10-CM

## 2016-10-09 DIAGNOSIS — I517 Cardiomegaly: Secondary | ICD-10-CM | POA: Diagnosis not present

## 2016-10-09 DIAGNOSIS — J410 Simple chronic bronchitis: Secondary | ICD-10-CM

## 2016-10-09 DIAGNOSIS — R630 Anorexia: Secondary | ICD-10-CM

## 2016-10-09 DIAGNOSIS — J181 Lobar pneumonia, unspecified organism: Secondary | ICD-10-CM

## 2016-10-09 DIAGNOSIS — R1115 Cyclical vomiting syndrome unrelated to migraine: Secondary | ICD-10-CM

## 2016-10-09 MED ORDER — BENZONATATE 100 MG PO CAPS: 100.0000 mg | ORAL_CAPSULE | Freq: Three times a day (TID) | ORAL | 0 refills | Status: DC | PRN

## 2016-10-09 MED ORDER — FLUTICASONE PROPIONATE 50 MCG/ACT NA SUSP: 2.0000 | Freq: Every day | NASAL | 3 refills | Status: AC

## 2016-10-09 MED ORDER — DOXYCYCLINE HYCLATE 100 MG PO TABS: 100.0000 mg | ORAL_TABLET | Freq: Two times a day (BID) | ORAL | 0 refills | Status: DC

## 2016-10-09 MED ORDER — PREDNISONE 50 MG PO TABS: 50.0000 mg | ORAL_TABLET | Freq: Every day | ORAL | 0 refills | Status: DC

## 2016-10-09 MED ORDER — ONDANSETRON HCL 4 MG PO TABS: 4.0000 mg | ORAL_TABLET | Freq: Three times a day (TID) | ORAL | 1 refills | Status: DC | PRN

## 2016-10-09 NOTE — Progress Notes (Signed)
Subjective:    Patient ID: Paula Frank, female    DOB: 1941-06-25, 76 y.o.   MRN: [id number]  Paula Frank is a 76 y.o. female presenting on 10/09/2016 for Anorexia (onset month lost energy and cough yellowish mucus)   HPI  Cough, Productive - Possible CAP / Allergic Rhinitis - Reports productive cough persistent for past 2 months, she had prior recent illness with viral symptoms and "minor flu-like" symptoms intermittently, recently had some worsening. - Tried Mucinex, Delsym, Robitussin - all some mild relief but has not resolved - Active smoker, unable to quit before. Has prior history of COPD, states she had a "psychological split or dissociation" last time she tried to quit. - Admits continued post nasal drainage contributing to her cough - Admits chills and temperature off but no fevers (Tmax 99.44F), admits feeling "tired" similar to prior pneumonia - Denies any sweats, chest pain or pressure, dyspnea, wheezing  Reduced Appetite / Abdominal Distention - Reports problem now over past 2 months, seems to be gradually worsening, she attributes it to not accepting son's death, he was 55 years old, passed from cancer and misses him very much still. She attributes the timing of her symptoms to this and describes her mood related to her appetite. She is followed by Psychiatry and Neurology, on variety of medications at this time. - Today 132 lbs, states down 2 lbs in few weeks, overall down 8 lbs by our scale in 3 months - She describes sensation as still feeling "very hungry" and has appetite but then when has food and tries to eat only eats about half portion and then loses appetite. - Additionally admits some abdominal distention and bloating, thinks this may be related. Also some dysphagia in past usually does fine but occasionally has had problems swallowing larger pieces of bread and certain pills at times. 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 Smoking status: Current Every Day Smoker    Packs/day: 1.00    Years: 60.00    Types: Cigarettes  . Smokeless tobacco: Never Used  . Alcohol use 4.2 - 8.4 oz/week    7 - 14 Standard drinks or equivalent per week     Comment: 2 glasses of wine a day    Review of Systems Per HPI unless specifically indicated above     Objective:    BP 139/70   Pulse (!) 106   Temp 97.4 F (36.3 C) (Oral)   Resp 16   Ht 5' 6"  (1.676 m)   Wt 132 lb (59.9 kg)   BMI 21.31 kg/m   Wt Readings from Last 3 Encounters:  10/09/16 132 lb (59.9 kg)  07/23/16 133 lb (60.3 kg)  07/17/16 140 lb (63.5 kg)    Physical Exam  Constitutional: She is oriented to person, place, and time. She appears well-developed and well-nourished. No distress.  Chronically ill-appearing 76 year female, mostly comfortable, cooperative  HENT:  Head: Normocephalic and atraumatic.  Mouth/Throat: Oropharynx is clear and moist.  Mild R maxillary sinus  tender to palpation. Nares patent with some congestion without purulence or edema. Bilateral ear canals with some cerumen without impaction, TMs partially visible normal landmarks without effusion or bulging. Oropharynx clear with some post nasal drainage without erythema, exudates, edema or asymmetry.  Eyes: Conjunctivae are normal. Right eye exhibits no discharge. Left eye exhibits no discharge.  Neck: Normal range of motion. Neck supple.  Cardiovascular: Regular rhythm, normal heart sounds and intact distal pulses.   No murmur heard. Mild tachycardia  Pulmonary/Chest: Effort normal. No  respiratory distress. She has no wheezes.  Reduced air movement bilateral, worse bilateral lung bases with some residual coarse breath sounds with reduced movement. No focal crackles. Occasional coughing  Neurological: She is alert and oriented to person, place, and time.  Skin: Skin is warm and dry. Rash (Bilateral lower extremities, maculopapular excoriated rash) noted. She is not diaphoretic. No erythema.  Psychiatric: She has a normal mood and affect. Her behavior is normal.  Well groomed, good eye contact, normal speech and thoughts, appropriately sad and crying spell when discussing passing of her son  Nursing note and vitals reviewed.  I have personally reviewed the radiology report from Chest X-ray 10/09/16  CLINICAL DATA:  Productive cough.  EXAM: CHEST  2 VIEW  COMPARISON:  07/11/2016.  04/29/2013.  FINDINGS: Mediastinum hilar structures are normal. Stable mild cardiomegaly . Minimal infiltrate right lung base cannot be excluded. Mild basilar pleuroparenchymal thickening again noted consistent scarring. No pleural effusion or pneumothorax. Postsurgical changes both shoulders.  IMPRESSION: 1.  Minimal infiltrate right lung base cannot be excluded.  2. Stable cardiomegaly.   Electronically Signed   By: Marcello Moores  Register   On: 10/09/2016 10:14   Signed By:  Fabio Neighbors., MD on 10/09/2016 10:14 AM     I have personally reviewed the following lab results from 07/11/16.  Results for orders placed or performed during the hospital encounter of 07/11/16  Urine culture  Result Value Ref Range   Specimen Description URINE, RANDOM    Special Requests NONE    Culture MULTIPLE SPECIES PRESENT, SUGGEST RECOLLECTION (A)    Report Status 07/12/2016 FINAL   Comprehensive metabolic panel  Result Value Ref Range   Sodium 139 135 - 145 mmol/L   Potassium 4.5 3.5 - 5.1 mmol/L   Chloride 105 101 - 111 mmol/L   CO2 26 22 - 32 mmol/L   Glucose, Bld 105 (H) 65 - 99  mg/dL   BUN 12 6 - 20 mg/dL   Creatinine, Ser 0.73 0.44 - 1.00 mg/dL   Calcium 8.9 8.9 - 10.3 mg/dL   Total Protein 7.4 6.5 - 8.1 g/dL   Albumin 2.9 (L) 3.5 - 5.0 g/dL   AST 17 15 - 41 U/L   ALT 12 (L) 14 - 54 U/L   Alkaline Phosphatase 108 38 - 126 U/L   Total Bilirubin 0.3 0.3 - 1.2 mg/dL   GFR calc non Af Amer >60 >60 mL/min   GFR calc Af Amer >60 >60 mL/min   Anion gap 8 5 - 15  CBC  Result Value Ref Range   WBC 8.2 3.6 - 11.0 K/uL   RBC 3.51 (L) 3.80 - 5.20 MIL/uL   Hemoglobin 11.4 (L) 12.0 - 16.0 g/dL   HCT 33.9 (L) 35.0 - 47.0 %   MCV 96.4 80.0 - 100.0 fL   MCH 32.5 26.0 - 34.0 pg   MCHC 33.7 32.0 - 36.0 g/dL   RDW 15.7 (H) 11.5 - 14.5 %  Platelets 769 (H) 150 - 440 K/uL  Troponin I  Result Value Ref Range   Troponin I 0.03 (HH) <0.03 ng/mL  Urinalysis, Complete w Microscopic  Result Value Ref Range   Color, Urine STRAW (A) YELLOW   APPearance CLEAR (A) CLEAR   Specific Gravity, Urine 1.008 1.005 - 1.030   pH 5.0 5.0 - 8.0   Glucose, UA NEGATIVE NEGATIVE mg/dL   Hgb urine dipstick NEGATIVE NEGATIVE   Bilirubin Urine NEGATIVE NEGATIVE   Ketones, ur NEGATIVE NEGATIVE mg/dL   Protein, ur NEGATIVE NEGATIVE mg/dL   Nitrite NEGATIVE NEGATIVE   Leukocytes, UA TRACE (A) NEGATIVE   RBC / HPF 0-5 0 - 5 RBC/hpf   WBC, UA 6-30 0 - 5 WBC/hpf   Bacteria, UA RARE (A) NONE SEEN   Squamous Epithelial / LPF 0-5 (A) NONE SEEN   Mucous PRESENT   Troponin I  Result Value Ref Range   Troponin I <0.03 <0.03 ng/mL      Assessment & Plan:   Problem List Items Addressed This Visit    Nausea & vomiting    Seems to be chronic problem, now maybe worse with postnasal drainage, productive cough and appetite changes - Refilled Zofran today - In future if not improving can consider referral to local GI given constellation of GI symptoms      Current tobacco use    Active smoker, not ready to quit      Appetite loss    Multifactorial likely mostly related to her depression/mood,  with passing of her son as primary factor, still has appetite but reduced on eating. Concern dementia may affect this as well. Some physiological factors too with prior history of dysphagia and nausea and abdominal bloating may factor in early satiety or reduced appetite.  Plan: 1. Start with treating underlying subacute to chronic respiratory illness - treating COPD/possible CAP with antibiotics and prednisone 2. Refilled Zofran PRN 3. Treating post nasal drainage to reduce potential nausea from this drainage - start Flonase 4. Given complex psychiatric / mental health history and current medication regimen - will defer starting appetite stimulant but consider Mirtazapine (Remeron) printed this name for patient and she may ask her Psychiatry in future if this can be added to her current regimen 5. Follow-up 3-6 weeks - may need additional evaluation with labs or nutritional support if not improving       Other Visit Diagnoses    Simple chronic bronchitis (August)    -  Primary   Relevant Medications   benzonatate (TESSALON) 100 MG capsule   predniSONE (DELTASONE) 50 MG tablet   doxycycline (VIBRA-TABS) 100 MG tablet   Other Relevant Orders   DG Chest 2 View (Completed)   Post-nasal drainage       Likely contributing to sinus drainage and persistent cough Start Flonase, new rx sent   Relevant Medications   fluticasone (FLONASE) 50 MCG/ACT nasal spray   ondansetron (ZOFRAN) 4 MG tablet   Community acquired pneumonia of right lower lobe of lung (HCC)       Relevant Medications   fluticasone (FLONASE) 50 MCG/ACT nasal spray   benzonatate (TESSALON) 100 MG capsule   doxycycline (VIBRA-TABS) 100 MG tablet      Presumed RLL CAP in setting of Bronchitis/COPD - Persistent chronic productive cough and intermittent sick symptoms with low grade fever, concerning for persistent pneumonia by history, identified on CXR today as possible infiltrate, still active smoker likely component of COPD,  without acute wheezing but some  reduced air movement today. Also viral/allergic symptoms likely contributing to lingering postnasal drainage - High risk patient with multiple co morbidities Lupus, COPD - No recent hospitalization or IV antibiotics in 90 days, only acute ED visit nearly 90 day ago  Plan: 1. Check CXR today - results with RLL probable infiltrate, patient notified after visit 2. Start Prednisone 62m daily x 5 day burst for COPD / decreased air movement / cough 3. Start empiric antibiotics with Doxycycline 1027mBID for 7 days - given precaution rx at time of visit, but then notified after CXR results (note multiple drug allergies, in past has tolerated Azithromycin well and even thinks she can take levaquin unsure if this was an allergy or intolerance) 4. Finish Mucinex for 1 more week 5. Start Tessalon Perls take 1 capsule up to 3 times a day as needed for cough 6. Start Flonase for now 7. Tylenol NSAID PRN fevers 8. Strict return criteria reviewed, follow-up if not improving when to go to hospital ED   Meds ordered this encounter  Medications  .       .       .       .       .       .       . fluticasone (FLONASE) 50 MCG/ACT nasal spray    Sig: Place 2 sprays into both nostrils daily. Use for 4-6 weeks then stop and use seasonally or as needed.    Dispense:  16 g    Refill:  3  . ondansetron (ZOFRAN) 4 MG tablet    Sig: Take 1 tablet (4 mg total) by mouth every 8 (eight) hours as needed for nausea or vomiting.    Dispense:  30 tablet    Refill:  1  . benzonatate (TESSALON) 100 MG capsule    Sig: Take 1 capsule (100 mg total) by mouth 3 (three) times daily as needed for cough.    Dispense:  30 capsule    Refill:  0  . predniSONE (DELTASONE) 50 MG tablet    Sig: Take 1 tablet (50 mg total) by mouth daily with breakfast.    Dispense:  5 tablet    Refill:  0  . doxycycline (VIBRA-TABS) 100 MG tablet    Sig: Take 1 tablet (100 mg total) by mouth 2 (two) times  daily. For 7 days, take with full glass of water, stay upright for 30 min after    Dispense:  14 tablet    Refill:  0      Follow up plan: Return in about 6 weeks (around 11/20/2016) for reduced appetite.  AlNobie PutnamDOAlbanyedical [facility] 10/09/2016, 2:09 PM

## 2016-10-09 NOTE — Assessment & Plan Note (Signed)
Multifactorial likely mostly related to her depression/mood, with passing of her son as primary factor, still has appetite but reduced on eating. Concern dementia may affect this as well. Some physiological factors too with prior history of dysphagia and nausea and abdominal bloating may factor in early satiety or reduced appetite.  Plan: 1. Start with treating underlying subacute to chronic respiratory illness - treating COPD/possible CAP with antibiotics and prednisone 2. Refilled Zofran PRN 3. Treating post nasal drainage to reduce potential nausea from this drainage - start Flonase 4. Given complex psychiatric / mental health history and current medication regimen - will defer starting appetite stimulant but consider Mirtazapine (Remeron) printed this name for patient and she may ask her Psychiatry in future if this can be added to her current regimen 5. Follow-up 3-6 weeks - may need additional evaluation with labs or nutritional support if not improving

## 2016-10-09 NOTE — Patient Instructions (Signed)
Thank you for coming in to clinic today.  1. It sounds like you had an Upper Respiratory Virus that has settled into a Bronchitis, lower respiratory tract infection. I don't have concerns for pneumonia today, and think that this should gradually improve. Once you are feeling better, the cough may take a few weeks to fully resolve. I do hear wheezing and coarse breath sounds, this may be due to the virus, also could be related to smoking. - Start Prednisone 50mg  daily for next 5 days - this will open up lungs allow you to breath better and treat that wheezing or bronchospasm - Start Tessalon Perls take 1 capsule up to 3 times a day as needed for cough - IF not improving after 24-48 hours, or develop worsening fever, productive cough then start Doxycycline if not improving - Start Loratadine (Claritin) 10mg  daily and Flonase 2 sprays in each nostril daily for next 4-6 weeks, then you may stop and use seasonally or as needed - Finish 1 more week of OTC Mucinex to help clear the mucus - Use nasal saline (Simply Saline or Ocean Spray) to flush nasal congestion multiple times a day, may help cough - Drink plenty of fluids to improve congestion  Check Chest X-ray today  If your symptoms seem to worsen instead of improve over next several days, including significant fever / chills, worsening shortness of breath, worsening wheezing, or nausea / vomiting and can't take medicines - return sooner or go to hospital Emergency Department for more immediate treatment.  Please discuss possible new medicine Mirtazapine (Remeron) with your psychiatrist to see if this medication will be safe add on.  Please schedule a follow-up appointment with Dr. Parks Ranger in 3-6 weeks follow-up with Reduced Appetite  If you have any other questions or concerns, please feel free to call the clinic or send a message through [place]. You may also schedule an earlier appointment if necessary.  Nobie Putnam, DO [facility]

## 2016-10-09 NOTE — Assessment & Plan Note (Signed)
Active smoker, not ready to quit 

## 2016-10-09 NOTE — Assessment & Plan Note (Signed)
Seems to be chronic problem, now maybe worse with postnasal drainage, productive cough and appetite changes - Refilled Zofran today - In future if not improving can consider referral to local GI given constellation of GI symptoms

## 2016-10-31 DIAGNOSIS — F0281 Dementia in other diseases classified elsewhere with behavioral disturbance: Secondary | ICD-10-CM | POA: Diagnosis not present

## 2016-11-01 ENCOUNTER — Other Ambulatory Visit: Payer: Self-pay | Admitting: Family Medicine

## 2016-11-01 DIAGNOSIS — R0982 Postnasal drip: Secondary | ICD-10-CM

## 2016-11-01 MED ORDER — ONDANSETRON HCL 4 MG PO TABS: 4.0000 mg | ORAL_TABLET | Freq: Three times a day (TID) | ORAL | 2 refills | Status: DC | PRN

## 2016-11-01 NOTE — Telephone Encounter (Signed)
Pt needs a refill on zofran sent to [facility] in [place].   She said the pharmacy changed.  Her call back number is [phone]

## 2016-11-05 DIAGNOSIS — Z01810 Encounter for preprocedural cardiovascular examination: Secondary | ICD-10-CM | POA: Diagnosis not present

## 2016-11-12 ENCOUNTER — Inpatient Hospital Stay: Payer: Self-pay | Admitting: Family Medicine

## 2016-11-21 ENCOUNTER — Ambulatory Visit (INDEPENDENT_AMBULATORY_CARE_PROVIDER_SITE_OTHER): Payer: Medicare Other | Admitting: Family Medicine

## 2016-11-21 ENCOUNTER — Encounter: Payer: Self-pay | Admitting: Family Medicine

## 2016-11-21 VITALS — BP 134/88 | HR 118 | Temp 98.4°F | Resp 16 | Ht 66.0 in | Wt 132.0 lb

## 2016-11-21 DIAGNOSIS — R0982 Postnasal drip: Secondary | ICD-10-CM

## 2016-11-21 DIAGNOSIS — F314 Bipolar disorder, current episode depressed, severe, without psychotic features: Secondary | ICD-10-CM | POA: Diagnosis not present

## 2016-11-21 DIAGNOSIS — J432 Centrilobular emphysema: Secondary | ICD-10-CM | POA: Diagnosis not present

## 2016-11-21 DIAGNOSIS — Z720 Tobacco use: Secondary | ICD-10-CM | POA: Diagnosis not present

## 2016-11-21 DIAGNOSIS — J449 Chronic obstructive pulmonary disease, unspecified: Secondary | ICD-10-CM | POA: Insufficient documentation

## 2016-11-21 DIAGNOSIS — I10 Essential (primary) hypertension: Secondary | ICD-10-CM | POA: Diagnosis not present

## 2016-11-21 DIAGNOSIS — R110 Nausea: Secondary | ICD-10-CM
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
## 2016-11-21 NOTE — Patient Instructions (Signed)
Thank you for coming to the clinic today.  1. Continue checking your blood pressure at home. - Notify the office if BP is persistently elevated >140/90, or really low < 100/70 and you feel sick  2. Continue to try to work on wean down on cigarettes, and consider quitting eventually  1 800-QUIT NOW  3. Follow-up with your Psychiatrist for mood for now. And also try to locate a counselor  Psych Counseling ONLY  Self Referral:  1. [facility] [place]   Address: [place], [place], [place] [address] Hours: Open today  9AM-7PM Phone: [phone]  2. [facility] [place] [place] Address: [address] [place], [place], [place] [address] Phone: [phone]   Self Referral [facility]) [place] [address] [place],  [address] Phone: [phone]  Please schedule a follow-up appointment with Dr. Parks Ranger in 3 months for follow-up Depression PHQ / HTN  If you have any other questions or concerns, please feel free to call the clinic or send a message through Experiment. You may also schedule an earlier appointment if necessary.  Nobie Putnam, DO [place]

## 2016-11-22 MED ORDER — ONDANSETRON HCL 4 MG PO TABS: 4.0000 mg | ORAL_TABLET | Freq: Three times a day (TID) | ORAL | 2 refills | Status: DC | PRN

## 2016-11-22 NOTE — Assessment & Plan Note (Signed)
Stable chronic problem worsened by lost loved ones husband/son, this is constant source of stress and anxiety for her, often with labile mood and crying spells but she is overall functioning better, based on PHQ and reported history. - Followed by Psychiatry - Encouraged to find counselor / therapy - support move to senior living community, with more support - No change to medications - Follow-up as needed

## 2016-11-22 NOTE — Addendum Note (Signed)
Addended by: Olin Hauser on: 11/22/2016 01:58 PM   Modules accepted: Orders

## 2016-11-22 NOTE — Assessment & Plan Note (Signed)
Active smoker Not ready to quit, in past had bad experiences  Discussion today >5 minutes (<10 minutes) specifically on counseling on risks of tobacco use, complications, treatment, smoking cessation.

## 2016-11-22 NOTE — Assessment & Plan Note (Signed)
Stable COPD, now resolved AECOPD Active smoker Not on significant maintenance therapy Follow-up as needed if worsening flares discuss therapy options

## 2016-11-22 NOTE — Assessment & Plan Note (Signed)
Stable with mild elevated BP Complicated with history of hypotension, has been off anti-HTN therapy No known CKD Monitor BP outside office Follow-up as needed

## 2016-12-17 DIAGNOSIS — F0281 Dementia in other diseases classified elsewhere with behavioral disturbance: Secondary | ICD-10-CM | POA: Diagnosis not present

## 2017-03-04 ENCOUNTER — Ambulatory Visit
Admission: RE | Admit: 2017-03-04 | Discharge: 2017-03-04 | Payer: MEDICARE | Attending: Dermatology | Admitting: Dermatology

## 2017-03-04 DIAGNOSIS — L280 Lichen simplex chronicus: Secondary | ICD-10-CM | POA: Diagnosis not present

## 2017-03-06 ENCOUNTER — Ambulatory Visit
Admission: RE | Admit: 2017-03-06 | Discharge: 2017-03-06 | Payer: MEDICARE | Attending: Dermatology | Admitting: Dermatology

## 2017-03-06 DIAGNOSIS — L309 Dermatitis, unspecified: Principal | ICD-10-CM

## 2017-03-11 ENCOUNTER — Ambulatory Visit
Admission: RE | Admit: 2017-03-11 | Discharge: 2017-03-11 | Payer: MEDICARE | Attending: Dermatology | Admitting: Dermatology

## 2017-03-11 DIAGNOSIS — L820 Inflamed seborrheic keratosis: Secondary | ICD-10-CM | POA: Diagnosis not present

## 2017-03-11 DIAGNOSIS — L298 Other pruritus: Secondary | ICD-10-CM | POA: Diagnosis not present

## 2017-03-11 DIAGNOSIS — I872 Venous insufficiency (chronic) (peripheral): Secondary | ICD-10-CM | POA: Diagnosis not present

## 2017-03-11 DIAGNOSIS — L299 Pruritus, unspecified: Principal | ICD-10-CM

## 2017-03-11 MED ORDER — TRIAMCINOLONE ACETONIDE 0.1 % TOPICAL OINTMENT
2 refills | 0 days | Status: CP
Start: 2017-03-11 — End: 2017-08-14

## 2017-03-15 ENCOUNTER — Telehealth: Payer: Self-pay | Admitting: Family Medicine

## 2017-03-15 NOTE — Telephone Encounter (Signed)
Pt. Called  Requesting a refill on  Nuvigil.  Pt  Call back  # [phone]  [phone]

## 2017-03-15 NOTE — Telephone Encounter (Signed)
Called pt requesting new psychiatrist and the number was given [phone] to schedule her appointment.

## 2017-03-15 NOTE — Telephone Encounter (Signed)
Please let patient know that I do not normally prescribe this medication. This is from her Psychiatrist.  Dr Luan Pulling was not prescribing this medication for her either.  Nobie Putnam, [facility] 03/15/2017, 12:31 PM

## 2017-03-18 ENCOUNTER — Ambulatory Visit
Admission: RE | Admit: 2017-03-18 | Discharge: 2017-03-18 | Payer: MEDICARE | Attending: Dermatology | Admitting: Dermatology

## 2017-03-18 DIAGNOSIS — L308 Other specified dermatitis: Secondary | ICD-10-CM | POA: Diagnosis not present

## 2017-03-18 DIAGNOSIS — L309 Dermatitis, unspecified: Principal | ICD-10-CM

## 2017-03-18 MED ORDER — DUPILUMAB 300 MG/2 ML SUBCUTANEOUS SYRINGE
INJECTION | SUBCUTANEOUS | 2 refills | 0.00000 days | Status: CP
Start: 2017-03-18 — End: 2017-03-18

## 2017-03-18 MED ORDER — DUPILUMAB 300 MG/2 ML SUBCUTANEOUS SYRINGE: Syringe | 2 refills | 0 days | Status: AC

## 2017-03-19 ENCOUNTER — Encounter: Payer: Self-pay | Admitting: Family Medicine

## 2017-03-19 NOTE — Unmapped (Signed)
ASSESSMENT AND PLAN:    Eczematous dermatitis of b/l LEs, with likely contributing factors including stasis dermatitis and possibly nummular eczema: This is a longstanding problem that patient has been seen for multiple times in our clinic, with previous impression of component of neurotic excoriations because of involvement of upper extremities, as well. Exam today is more c/w primary eczematous dermatitis of lower extremities. 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 Previously testing including SPEP, UPEP, ANA, ENA, DsDNA and TSH with Free T4 wnl 06/2015. Biopsy 02/2015 c/w LSC and non-inflammatory purpura. Urine porphyrins previously wnl.    - Discussed management options in detail including risks, benefits, and alternatives; a joint decision was made to proceed with plan as below:    - CONT doxepin (SINEQUAN) 25 MG capsule; Take 1 pill each night  - Pt continues on Seroquel, Cymbalta; encouraged continued f/u with psychiatry and PCP, favor holding current dose of doxepin given potential increased risk of drug-drug interactions with these or other medications at higher doses  - After extensive discussion, pt is amenable to another attempt at [facility] boot of LLE  - continue TAC PRN to other areas  - concurrently, given e/o primary eczematous dermatitis, will attempt to obtain Dupixent   - Rx sent to [facility]    Return for 9/28 or 10/1.      CHIEF COMPLAINT: Established patient, here for f/u rash    HPI: This is 76 y.o.-year-old who is seen in follow up for a long-standing dermatitis. Last seen by Dr. Orma Flaming 03/11/17. Given improvement w/ Unna boot placed prior to that visit, attempt to apply another Unna boot to R leg as well as Coban-wrap L leg made. However, she reports both fell off after 2 days. Rash has persisted. Using topical TAC w/o resolution. However, overall still much better than prior to [employer].    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      Disease course:  The patient initially presented on 12/20/14 with an itchy rash. While we initially thought the patient had porphyria, her testing was normal, along with normal hepatitis panel, creatinine and transaminases. Prior treatment also includes permethrin cream. The patient had been improving slowly with topical steroids and currently being treated for neurotic excoriations with topical steroids and doxepin. She has a history of depression requiring multiple medications and there is some concern for medication non-compliance.    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      PHYSICAL EXAMINATION:  General: Well-developed, well-nourished. No acute distress.   Neuro: Alert and oriented, answers questions appropriately.  Skin: Per patient request: Focal examination of face, BUE, BLE, back performed today and is pertinent for:  - erythematous papules coalescing into lichenified plaque, BLE w/ overlying excoriations    - All other areas examined were normal or had no significant findings    The patient was seen and examined by Dr. Orma Flaming who agrees  with the assessment and plan as above.

## 2017-03-21 NOTE — Unmapped (Signed)
I saw and evaluated the patient, participating in the key elements of the service.  I discussed the findings, assessment and plan with the resident and agree with resident???s findings and plan as documented in the resident???s note.  I was present for the entirety of the procedure(s).     Essie Christine, MD

## 2017-03-22 ENCOUNTER — Ambulatory Visit: Admission: RE | Admit: 2017-03-22 | Discharge: 2017-03-22 | Payer: MEDICARE

## 2017-03-25 NOTE — Unmapped (Signed)
Error

## 2017-03-26 NOTE — Unmapped (Signed)
[facility] called requesting to refer Melia back to [facility] Psychiatry, last seen 07/2015 by Rhunette Croft, NP.  Writer discussed with caller that Loriel would be considered a new patient since out of clinic over 12 months and per Rhunette Croft notes should be seen in geriatric resident clinic.  [facility] is currently experiencing a delay in appointments and immediate scheduling not available, [facility] verbalized understanding and agreeable to Brileigh being added to scheduling list and contacted once appointments available.

## 2017-04-01 NOTE — Unmapped (Signed)
PA for Dupixent denied    Appeal letter sent (see Letters)

## 2017-04-05 NOTE — Unmapped (Signed)
Patient called in today requesting a Rx refill for Wellbutrin XL 150 mg pt was last seen 08/07/2016.  Pt has appointment scheduled for 10/152018 .But needs to reschedule due to know transportation.

## 2017-04-07 NOTE — Unmapped (Signed)
ASSESSMENT AND PLAN:    Eczematous dermatitis of b/l LEs, with likely contributing factors including stasis dermatitis and possibly nummular eczema: This is a longstanding problem that patient has been seen for multiple times in our clinic, with previous impression of component of neurotic excoriations because of involvement of upper extremities, as well. Possibly with a component of an eczematous dermatitis and stasis.    Her dermatologic issues are complicated by her psychiatric disorder. She has been admitted in the past for alcohol detox (10/2016) and has been suspected of opioid abuse. She also has a history of depression. Today she states she is hearing voices but denies any SI/HI and she does not have access to firearms.    Previously testing including SPEP, UPEP, ANA, ENA, DsDNA and TSH with Free T4 wnl 06/2015. Biopsy 02/2015 c/w LSC and non-inflammatory purpura. Urine porphyrins previously wnl.    Given her worsening psychiatric state today, we will defer changing any systemic medications.    She is scheduling an appointment with [facility] psychiatry which I feel will be of significant benefit to her. We offered her inpatient admission for psychiatric assistance; however, she declined this and I do not believe she meets criteria for involuntary commitment.    Patient is not amendable to [employer] although this has been helpful in the past. For now will continue TAC ointment with ACE wraps or compression.    We reiterated the importance of her following up regarding the dupilumab MAPs program. Message sent to Shared [employer]. We will attempt to reach out to [facility] to see if we can help facilitate this.    No Follow-up on file.      CHIEF COMPLAINT: Here for follow up rash.    HPI: This is 76 y.o.-year-old who is seen in follow up for a long-standing dermatitis. Last seen by Dr. Dorna Bloom 10/18. At [facility], we attempted to start dupilumab and continued to utilize an [place] boat to the LLE.     She was unable to tolerate the [employer]. She is using TAC ointment and ACE wraps which she finds somewhat helpful. She was contacted regarding the Dupilumab MAPS program; however, she told [facility] she was not interested as she thought this was a Designer, multimedia. Overall she believes her legs are worse. She is intensely pruritic and endorses scratching the area.    Of note, throughout our interview she was quite hysterical. She states she is severely depressed and has a hard time getting out of bed. She endorses hearing voices and having difficulty with her memory. She adamantly denied any SI/HI. She lives in an assisted living facility where she feels safe and does not have access to firearms.     Disease course:  The patient initially presented on 12/20/14 with an itchy rash. While we initially thought the patient had porphyria, her testing was normal, along with normal hepatitis panel, creatinine and liver enzymes. She has a possible history of substance abuse or an underlying psychiatric disorder and the question of neurotic excoriations has been raised. She has been treated with permetherin, TCS, and doxepin.    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      PHYSICAL EXAMINATION:  General: Well-developed, well-nourished. No acute distress.   Neuro: Alert and oriented, answers questions appropriately.  Skin: Per patient request: Focal examination of face, BUE, BLE, back performed today and is pertinent for:  - Erythematous papules coalescing into lichenified plaque, BLE w/ overlying excoriations. Worse overlying the left medial malleolus   - All other areas examined were normal or had no significant findings    The patient was seen and examined by Dr. Orma Flaming who agrees with the assessment and plan as above.

## 2017-04-08 ENCOUNTER — Ambulatory Visit: Admission: RE | Admit: 2017-04-08 | Discharge: 2017-04-08 | Payer: MEDICARE

## 2017-04-08 DIAGNOSIS — L300 Nummular dermatitis: Secondary | ICD-10-CM | POA: Diagnosis not present

## 2017-04-08 NOTE — Unmapped (Addendum)
Continue to use the topical steroids (triamcinolone) on your legs. Try to keep them wrapped as much as possible. You can use compression socks (with 15-41mmHg) which can help with your circulation.    The pharmacy assistance program will be calling you about setting up the assistance program for your medication. Please pick up when they call so we can get this set up to ship to your house.    Give your new psychiatrist our card so we can talk to them over the phone.

## 2017-04-08 NOTE — Unmapped (Signed)
Called, VM not set up.   Will continue to attempt to call to advise patient to reach out to [facility] MAP for assistance w/ Dupixent manufacturers' assistance program application for tx of her eczematous dermatitis    [facility] MAP has also mailed a letter w/ contact info to patient

## 2017-04-08 NOTE — Unmapped (Signed)
Pt approved for Dupixent on appeal but copay is high. Attempted MFA assistance, but pt declined    [facility] Referral: Mfr Assistance Application - CLOSED      Dupixent - Anticipated co-pay: $2,069.62      Reached out to the patient today at 9:15am to discuss manufacturer assistance options. I introduced myself and where I was calling from and the woman that answered stated No thank you. and hung up the phone. This referral has been closed but if the patient would like to pursue assistance she can call a MAP specialist to discuss her options and at that time the referral can be reopened.     Will continue to encourage pt to contact MAP at f/u

## 2017-04-11 NOTE — Unmapped (Signed)
I saw and evaluated the patient, participating in the key portions of the service.  I reviewed the resident???s note.  I agree with the resident???s findings and plan.    Essie Christine, MD

## 2017-04-23 NOTE — Unmapped (Signed)
Returned call to patient.  The medication injections are too expensive.  Need help with Social Services.  Patient rambling on and hard to understand what she was talking about.  Would begin to cry and said she needs help.

## 2017-04-25 ENCOUNTER — Telehealth: Payer: Self-pay | Admitting: Family Medicine

## 2017-04-25 NOTE — Telephone Encounter (Signed)
Pt needs a psychiatrist for medication locally because she depends on [place] bus.  Her call back number is [phone]

## 2017-04-26 NOTE — Telephone Encounter (Signed)
If you can help with this patient do bot know where to refer her ?

## 2017-04-26 NOTE — Telephone Encounter (Signed)
Left detail message. 

## 2017-04-26 NOTE — Telephone Encounter (Signed)
PSYCHIATRY Referral information  It depends on which location she prefers to go to.  The closest options are in [place].  These are both self referral only - she does not need any order from [place], she will need to go there in person and do a walk-in or call in advance and see the Psychiatrist that way.  [facility]) [place] [address] [place], [place] [address] Phone: [phone]  [employer], available walk-in 9am-4pm M-F [place], [place] [address] Hours: [place] (M-F, walk in available) [phone]  ---------------------------------------------------  If she would like a referral to a Psychiatry office instead of the offices in [place], then the only options are outside of our area. It would need to be a referral to either:  [facility] [place], [place] [address] Phone: [phone]  OR  [facility] [address] [place], [id number] Phone: [phone] (Option 1) -------------------------------------------------  Nobie Putnam, [facility] 04/26/2017, 3:53 PM

## 2017-06-28 NOTE — Unmapped (Signed)
Cancelled her appointment with psychiatry the day of her appointment. She can reschedule at [phone], but it is likely she would have to go back on the wait list.

## 2017-07-02 ENCOUNTER — Ambulatory Visit: Payer: Self-pay | Admitting: Family Medicine

## 2017-07-10 ENCOUNTER — Encounter: Payer: Self-pay | Admitting: Family Medicine

## 2017-07-10 ENCOUNTER — Ambulatory Visit
Admission: RE | Admit: 2017-07-10 | Discharge: 2017-07-10 | Disposition: A | Discharge status: Home / Self Care | Payer: Medicare Other | Source: Ambulatory Visit | Attending: Family Medicine | Admitting: Family Medicine

## 2017-07-10 ENCOUNTER — Ambulatory Visit (INDEPENDENT_AMBULATORY_CARE_PROVIDER_SITE_OTHER): Payer: Medicare Other | Admitting: Family Medicine

## 2017-07-10 VITALS — BP 133/71 | HR 97 | Temp 97.3°F | Resp 16 | Ht 66.0 in | Wt 134.0 lb

## 2017-07-10 DIAGNOSIS — L280 Lichen simplex chronicus: Secondary | ICD-10-CM

## 2017-07-10 DIAGNOSIS — R1907 Generalized intra-abdominal and pelvic swelling, mass and lump: Secondary | ICD-10-CM | POA: Insufficient documentation

## 2017-07-10 DIAGNOSIS — R109 Unspecified abdominal pain: Secondary | ICD-10-CM | POA: Diagnosis not present

## 2017-07-10 DIAGNOSIS — K6389 Other specified diseases of intestine: Secondary | ICD-10-CM | POA: Insufficient documentation

## 2017-07-10 DIAGNOSIS — K5909 Other constipation: Secondary | ICD-10-CM | POA: Diagnosis not present

## 2017-07-10 DIAGNOSIS — F314 Bipolar disorder, current episode depressed, severe, without psychotic features: Secondary | ICD-10-CM

## 2017-07-10 NOTE — Progress Notes (Signed)
Subjective:    Patient ID: Paula Frank, female    DOB: [birth year], 77 y.o.   MRN: [id number]  Paula Frank is a 77 y.o. female presenting on 07/10/2017 for Abdominal Pain (abdomen bulge as per patient onset month )   HPI   Bipolar / Mood Disorder / History of Alcohol Abuse Chronic problem with mood disorder and history of alcohol abuse, other fam history of mental health issues, exacerbated by passing of her husband/son and other emotional trauma. She has been managed by variety of providers before, limited success. She was seen locally by [facility] but she is very adamant about not going back, as states was "taken off all psych meds", but she does not seem willing to follow-up with any other Psychiatry. She has a variety of Psych meds on her list, but unsure exactly what she is taking, did not bring bottles. SHe was referred to [facility] Psychiatry by [facility] ED and also recommended by her Dermatology at [facility], but patient canceled apt on same day and never re-rescheduled - She was urged to schedule but she is very resistant - Denies suicidal or homicidal mood  CHRONIC Abdominal Pain and Distention / Constipation - Patient reports this as new complaint within past 1-2 months, she is unsure onset. However brief chart review shows she has been followed and treated for this since 2017. She was seen by her prior PCP Dr Luan Pulling, and referred to [facility] GI Dr Allen Norris, she had CT Scan unremarkable and also referred, I do not see that she established with GI at that time, but patient states she was told there was nothing they could do for her. - She does admit to having constipation intermittently, often has small non formed stool pellets, otherwise can be more formed, mostly straw colored otherwise can be darker - Taking Colace 100mg  daily in AM with limited relief - She also has a history of dysphagia that has affected her intermittently, sometimes cannot even swallow saliva - Reports in past while in [place] she  had EGD or Colonoscopy, unsur eof results  Urinary Incontinence / Mixed - s/p hysterectomy. Describes urinary urgency but no immediate incontinence - Denies dysuria, hematuria, frequency  Other chronic updates not focused on acute visit - Memory Loss among other NeuroPsych diagnoses - admits still has problem with this, has followed by [facility] Neurology Dr Melrose Nakayama in past. 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, still has same rash on inner aspects of her ankles, asking about this but states she has her derm does not want to discuss it   Depression screen [facility] 2/9 11/21/2016 07/23/2016 02/23/2016  Decreased Interest 0 3 0  Down, Depressed, Hopeless 1 3 0  PHQ - 2 Score 1 6 0  Altered sleeping 1 2 -  Tired, decreased energy 1 2 -  Change in appetite 0 2 -  Feeling bad or failure about yourself  1 3 -  Trouble concentrating 2 3 -  Moving slowly or fidgety/restless 0 3 -  Suicidal thoughts 0 3 -  PHQ-9 Score 6 24 -  Difficult doing work/chores - Extremely dIfficult -    Social History   Tobacco Use  . Smoking status: Current Every Day Smoker    Packs/day: 1.00    Years: 60.00    Pack years: 60.00    Types: Cigarettes  . Smokeless tobacco: Never Used  Substance Use Topics  . Alcohol use: Yes    Alcohol/week: 4.2 - 8.4 oz    Types: 7 - 14 Standard drinks or equivalent per week    Comment: 2 glasses of wine a day  . Drug use: No    Review of Systems Per HPI unless specifically indicated above     Objective:    BP 133/71   Pulse 97   Temp (!) 97.3 F (36.3 C) (Oral)   Resp 16   Ht 5\' 6"  (1.676 m)   Wt 134 lb (60.8 kg)   BMI 21.63 kg/m   Wt Readings from Last 3 Encounters:  07/10/17 134 lb (60.8 kg)  11/21/16 132 lb (59.9 kg)  10/09/16 132 lb (59.9 kg)    Physical Exam  Constitutional: She is oriented to person,  place, and time. She appears well-developed and well-nourished. No distress.  Chronically ill appearing 77 year old female, comfortable, cooperative  HENT:  Head: Normocephalic and atraumatic.  Mouth/Throat: Oropharynx is clear and moist.  Eyes: Conjunctivae are normal.  Cardiovascular: Normal rate, regular rhythm, normal heart sounds and intact distal pulses.  No murmur heard. Pulmonary/Chest: Effort normal. No respiratory distress. She has no wheezes. She has no rales.  Stable reduced air movement  Abdominal: Soft. She exhibits distension. She exhibits no mass. There is no tenderness. There is no rebound.  Hyperactive bowel sounds, soft abdominal distention generalized, without any rigidity  Musculoskeletal: She exhibits no edema.  Neurological: She is alert and oriented to person, place, and time.  Skin: Skin is warm and dry. Rash (inner ankles with excoriated rash) noted. She is not diaphoretic. No erythema.  Psychiatric: Her behavior is normal.  Well groomed, good eye contact, speech is pressured at times and occasionally more rapid, thoughts are normal has poor insight into health. Chronic labile mood with frequent sad and near crying spell  Nursing note and vitals reviewed.    I have personally reviewed the radiology report from 07/10/17 on Abdominal KUB X-ray.  CLINICAL DATA: Chronic abdominal distension with worsening gas. Nausea.  EXAM: ABDOMEN - 1 VIEW  COMPARISON: CT scan 02/03/2016.  FINDINGS: Supine view the abdomen shows diffuse gaseous distention of colon with stool scattered along its length. Formed stool is noted in the left colon. No substantial gaseous small bowel dilatation. No unexpected abdominopelvic calcification. Diffuse bony demineralization noted with convex leftward lumbar scoliosis and degenerative changes in the lumbar spine.  IMPRESSION: Diffuse gaseous colonic distention with formed stool in the descending colon, sigmoid colon and rectum.  Imaging features would be compatible with clinical constipation.   Electronically Signed By: Misty Stanley M.D. On: 07/10/2017 16:54  Results for orders placed or performed during the hospital encounter of 07/11/16  Urine culture  Result Value Ref Range   Specimen Description URINE, RANDOM    Special Requests NONE    Culture MULTIPLE SPECIES PRESENT, SUGGEST RECOLLECTION (A)    Report Status 07/12/2016 FINAL   Comprehensive metabolic panel  Result Value Ref Range   Sodium 139 135 - 145 mmol/L   Potassium 4.5 3.5 - 5.1 mmol/L   Chloride 105 101 - 111 mmol/L   CO2 26 22 - 32 mmol/L   Glucose, Bld 105 (H) 65 - 99 mg/dL   BUN 12 6 - 20 mg/dL   Creatinine, Ser 0.73 0.44 - 1.00 mg/dL   Calcium 8.9 8.9 - 10.3 mg/dL   Total Protein 7.4 6.5 - 8.1 g/dL   Albumin 2.9 (L) 3.5 - 5.0 g/dL  AST 17 15 - 41 U/L   ALT 12 (L) 14 - 54 U/L   Alkaline Phosphatase 108 38 - 126 U/L   Total Bilirubin 0.3 0.3 - 1.2 mg/dL   GFR calc non Af Amer >60 >60 mL/min   GFR calc Af Amer >60 >60 mL/min   Anion gap 8 5 - 15  CBC  Result Value Ref Range   WBC 8.2 3.6 - 11.0 K/uL   RBC 3.51 (L) 3.80 - 5.20 MIL/uL   Hemoglobin 11.4 (L) 12.0 - 16.0 g/dL   HCT 33.9 (L) 35.0 - 47.0 %   MCV 96.4 80.0 - 100.0 fL   MCH 32.5 26.0 - 34.0 pg   MCHC 33.7 32.0 - 36.0 g/dL   RDW 15.7 (H) 11.5 - 14.5 %   Platelets 769 (H) 150 - 440 K/uL  Troponin I  Result Value Ref Range   Troponin I 0.03 (HH) <0.03 ng/mL  Urinalysis, Complete w Microscopic  Result Value Ref Range   Color, Urine STRAW (A) YELLOW   APPearance CLEAR (A) CLEAR   Specific Gravity, Urine 1.008 1.005 - 1.030   pH 5.0 5.0 - 8.0   Glucose, UA NEGATIVE NEGATIVE mg/dL   Hgb urine dipstick NEGATIVE NEGATIVE   Bilirubin Urine NEGATIVE NEGATIVE   Ketones, ur NEGATIVE NEGATIVE mg/dL   Protein, ur NEGATIVE NEGATIVE mg/dL   Nitrite NEGATIVE NEGATIVE   Leukocytes, UA TRACE (A) NEGATIVE   RBC / HPF 0-5 0 - 5 RBC/hpf   WBC, UA 6-30 0 - 5 WBC/hpf    Bacteria, UA RARE (A) NONE SEEN   Squamous Epithelial / LPF 0-5 (A) NONE SEEN   Mucus PRESENT   Troponin I  Result Value Ref Range   Troponin I <0.03 <0.03 ng/mL      Assessment & Plan:   Problem List Items Addressed This Visit    Abdominal swelling, generalized   Relevant Orders   DG Abd 1 View (Completed)   Bipolar I disorder, most recent episode depressed, severe without psychotic features ([place]) - Primary    Chronic problem, now seems worsening control. Uncertain med adherence, does not have pill bottles and has been on adherent to follow-up, still with chronic labile mood. - No longer going to [place] - She was referred to [facility] Psychiatry but she canceled and did not re-schedule, unable to provide good reason, fixates on not wanting to go back to other psychiatrist and that she needs help but then declines to follow-up as advised  Plan: 1. Encouraged her to contact [facility] Psychiatry for establishing care - contact info given, similarly to recent contact by her Lajuana Ripple who attempted same 2. Pleased that she is at ALF and has assistance at this time 3. Follow-up if not improving, return criteria      Constipation, chronic    Suspected primary etiology underlying her chronic abdominal distention and episodic constipation Prior work-up was unremarkable with Abd CT in past Previously followed by GI, but do not have record, unsure if she established  Plan 1. KUB today - reviewed with significant gas distended and formed stool 2. Advised she start regular dosing Miralax 17g or 1 cap daily then titrate up or down as instructed to goal, may need maintenance daily dose 3. May continue Colace or stop if not helping 4. Improve hydration 5. 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   Follow up plan: Return in about 6 weeks (around 08/21/2017) for abdominal distention, psych f/u.  Nobie Putnam, [facility] 07/11/2017, 7:37 AM

## 2017-07-10 NOTE — Patient Instructions (Addendum)
Thank you for coming to the office today.  1.  Please call and reschedule [facility] Psychiatry appointment [phone]  2. Check Abdominal X-ray KUB today for gas  Try Gas-X over the counter  Recommend Miralax 17g (capful daily) every day for 3-4 days for help regulate bowel movements  Recommend increase fiber intake  If not improving we would refer you back to GI as we've discussed  3. Please discuss nerve symptoms with Dr Melrose Nakayama [facility]  Please schedule a Follow-up Appointment to: Return in about 6 weeks (around 08/21/2017) for abdominal distention, psych f/u.   If you have any other questions or concerns, please feel free to call the office or send a message through [place]. You may also schedule an earlier appointment if necessary.  Additionally, you may be receiving a survey about your experience at our office within a few days to 1 week by e-mail or mail. We value your feedback.  Nobie Putnam, DO [place]

## 2017-07-11 DIAGNOSIS — K5909 Other constipation: Secondary | ICD-10-CM | POA: Insufficient documentation

## 2017-07-11 NOTE — Assessment & Plan Note (Signed)
Chronic problem, now seems worsening control. Uncertain med adherence, does not have pill bottles and has been on adherent to follow-up, still with chronic labile mood. - No longer going to [place] - She was referred to [name] Psychiatry but she canceled and did not re-schedule, unable to provide good reason, fixates on not wanting to go back to other psychiatrist and that she needs help but then declines to follow-up as advised  Plan: 1. Encouraged her to contact [facility] Psychiatry for establishing care - contact info given, similarly to recent contact by her Lajuana Ripple who attempted same 2. Pleased that she is at ALF and has assistance at this time 3. Follow-up if not improving, return criteria

## 2017-07-11 NOTE — Assessment & Plan Note (Signed)
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

## 2017-07-11 NOTE — Assessment & Plan Note (Signed)
Suspected primary etiology underlying her chronic abdominal distention and episodic constipation Prior work-up was unremarkable with Abd CT in past Previously followed by GI, but do not have record, unsure if she established  Plan 1. KUB today - reviewed with significant gas distended and formed stool 2. Advised she start regular dosing Miralax 17g or 1 cap daily then titrate up or down as instructed to goal, may need maintenance daily dose 3. May continue Colace or stop if not helping 4. Improve hydration 5. Follow-up if not improved - can consider return to GI with also other symptoms dysphagia intermittently, previous PCP had referred to GI but do not see it was accomplished

## 2017-07-31 ENCOUNTER — Ambulatory Visit (INDEPENDENT_AMBULATORY_CARE_PROVIDER_SITE_OTHER): Payer: Medicare Other | Admitting: Family Medicine

## 2017-07-31 ENCOUNTER — Encounter: Payer: Self-pay | Admitting: Family Medicine

## 2017-07-31 VITALS — BP 178/93 | HR 97 | Temp 98.2°F | Resp 16 | Ht 66.0 in | Wt 129.0 lb

## 2017-07-31 DIAGNOSIS — J209 Acute bronchitis, unspecified: Secondary | ICD-10-CM

## 2017-07-31 DIAGNOSIS — H6121 Impacted cerumen, right ear: Secondary | ICD-10-CM | POA: Diagnosis not present

## 2017-07-31 DIAGNOSIS — R110 Nausea: Secondary | ICD-10-CM

## 2017-07-31 DIAGNOSIS — R6883 Chills (without fever): Secondary | ICD-10-CM | POA: Diagnosis not present

## 2017-07-31 DIAGNOSIS — H9191 Unspecified hearing loss, right ear: Secondary | ICD-10-CM | POA: Diagnosis not present

## 2017-07-31 DIAGNOSIS — G2581 Restless legs syndrome: Secondary | ICD-10-CM | POA: Diagnosis not present

## 2017-07-31 LAB — POCT INFLUENZA A/B
INFLUENZA A, POC: NEGATIVE
INFLUENZA B, POC: NEGATIVE

## 2017-07-31 MED ORDER — ONDANSETRON HCL 4 MG PO TABS: 4.0000 mg | ORAL_TABLET | Freq: Three times a day (TID) | ORAL | 2 refills | Status: AC | PRN

## 2017-07-31 MED ORDER — ROPINIROLE HCL 1 MG PO TABS: ORAL_TABLET | ORAL | 2 refills | Status: DC

## 2017-07-31 MED ORDER — BENZONATATE 100 MG PO CAPS: 100.0000 mg | ORAL_CAPSULE | Freq: Three times a day (TID) | ORAL | 0 refills | Status: DC | PRN

## 2017-07-31 MED ORDER — AZITHROMYCIN 250 MG PO TABS: ORAL_TABLET | ORAL | 0 refills | Status: DC

## 2017-07-31 NOTE — Progress Notes (Signed)
Subjective:    Patient ID: Paula Frank, female    DOB: 06/15/1941, 77 y.o.   MRN: [id number]  Paula Frank is a 77 y.o. female presenting on 07/31/2017 for Fever (chills light HA ear pain Right side scrathy but not sore throat and post nasal drainge onset week)  Patient presents for a same day appointment.  HPI   URI / SINUS vs BRONCHITIS / R Ear Pain Reports symptoms started within past 1 week had recent URI symptoms was more with congestion now worsening with productive cough and R ear has sensation of clogged or fullness reduced hearing without pain, feels R ear pressure - Taking Aspirin OTC PRN fever - Admits post nasal drip and worsening with some nausea and cough now more productive cream colored - Admits fever up to 101F yesterday - Admits reduced PO intake, fluid intake is okay - Admits diarrhea - Denies any general body and muscle aches, but admits some upper back shoulder aches - Denies any vomiting, abdominal pain  Bipolar/Mood Disorder - Last visit with me 07/10/17, for same problem, see prior notes for background information. - Interval update with she has not scheduled with Psychiatry, she had concerns about location but would be willing to see [facility] Psychiatry as requested as some of her other care is located there. She is asking again about other options in [place] Maintenance: Declined Flu Shot  Depression screen [facility] 2/9 11/21/2016 07/23/2016 02/23/2016  Decreased Interest 0 3 0  Down, Depressed, Hopeless 1 3 0  PHQ - 2 Score 1 6 0  Altered sleeping 1 2 -  Tired, decreased energy 1 2 -  Change in appetite 0 2 -  Feeling bad or failure about yourself  1 3 -  Trouble concentrating 2 3 -  Moving slowly or fidgety/restless 0 3 -  Suicidal thoughts 0 3 -  PHQ-9 Score 6 24 -  Difficult doing work/chores - Extremely dIfficult -    Social History   Tobacco Use  . Smoking status: Current Every Day Smoker    Packs/day: 1.00    Years: 60.00   Pack years: 60.00    Types: Cigarettes  . Smokeless tobacco: Never Used  Substance Use Topics  . Alcohol use: Yes    Alcohol/week: 4.2 - 8.4 oz    Types: 7 - 14 Standard drinks or equivalent per week    Comment: 2 glasses of wine a day  . Drug use: No    Review of Systems Per HPI unless specifically indicated above     Objective:    BP (!) 178/93   Pulse 97   Temp 98.2 F (36.8 C) (Oral)   Resp 16   Ht 5' 6"  (1.676 m)   Wt 129 lb (58.5 kg)   SpO2 100%   BMI 20.82 kg/m   Wt Readings from Last 3 Encounters:  07/31/17 129 lb (58.5 kg)  07/10/17 134 lb (60.8 kg)  11/21/16 132 lb (59.9 kg)    Physical Exam  Constitutional: She is oriented to person, place, and time. She appears well-developed and well-nourished. No distress.  Mildly ill appearing 77 year old female  HENT:  Head: Normocephalic and atraumatic.  Mouth/Throat: Oropharynx is clear and moist.  Frontal / maxillary sinuses non-tender. Nares patent without purulence or edema. Bilateral TMs obscured by cerumen, R ear is impacted with harder dry cerumen.  Oropharynx with some post nasal drainage in throat, without erythema, exudates, edema or asymmetry.  Eyes: Conjunctivae are  normal. Right eye exhibits no discharge. Left eye exhibits no discharge.  Cardiovascular: Normal rate, regular rhythm, normal heart sounds and intact distal pulses.  No murmur heard. Pulmonary/Chest: Effort normal. No respiratory distress. She has no wheezes. She has rales (mild lower bilateral crackles and rhonchi, limited clearing with cough).  Reduced air movement is mostly unchanged from previous exam.  Musculoskeletal: She exhibits no edema.  Lymphadenopathy:    She has no cervical adenopathy.  Neurological: She is alert and oriented to person, place, and time.  Skin: Skin is warm and dry. She is not diaphoretic. No erythema.  Psychiatric: Her behavior is normal.  Well groomed, good eye contact, speech is pressured at times and  occasionally more rapid, thoughts are normal has poor insight into health. Chronic labile mood similar to before.  Nursing note and vitals reviewed.  Results for orders placed or performed in visit on 07/31/17  POCT Influenza A/B  Result Value Ref Range   Influenza A, POC Negative Negative   Influenza B, POC Negative Negative      Assessment & Plan:   Problem List Items Addressed This Visit    None    Visit Diagnoses    Acute bronchitis, unspecified organism    -  Primary  Consistent with worsening bronchitis in setting of likely viral URI - history questionable for flu-like or viral illness, neg flu, afebrile now - Afebrile, no focal signs of infection (not consistent with pneumonia by history or exam), no evidence sinusitis. Mild coarse breath sounds on exam   Plan: 1. Start Azithromycin Z-pak dosing 568m then 2595mdaily x 4 days -Start Tessalon Perls take 1 capsule up to 3 times a day as needed for cough 2. Recommend trial OTC - Mucinex, Tylenol/Ibuprofen PRN, Nasal saline, lozenges, tea with honey/lemon 3. Return criteria reviewed, follow-up within 1 week if not improved     Relevant Medications   azithromycin (ZITHROMAX Z-PAK) 250 MG tablet   benzonatate (TESSALON) 100 MG capsule   Chills (without fever)     Flu test negative today    Relevant Orders   POCT Influenza A/B (Completed)   Right ear impacted cerumen       Hearing reduced, right      Significant amount of large thick impacted cerumen bilaterally but R>L, suspected primary cause of current reduced bilateral hearing and ear pain.  Plan: 1. Attempted office ear lavage cerumen removal without much success, noticeable softening and partial removal, but still deeper cerumen impaction - also patient did not tolerate flushing well. 2. Trial on OTC Debrox drops removal kit and flushing at home 3. Notify office if not improving 2-4 weeks, may return for re-evaluation or as discussed can proceed with ENT referral  for formal cerumen removal and consider audiology hearing evaluation     Nausea     Refilled with chronic intermittent nausea   Relevant Medications   ondansetron (ZOFRAN) 4 MG tablet   Restless leg syndrome     Refilled Requip   Relevant Medications   rOPINIRole (REQUIP) 1 MG tablet      #Bipolar Mood Disorder Again handout given [facility] as she was requested before by other provider and by me at last visit. - I advised her that if this option does not work due to transportation or other limitations then we can consider other local options, however this is very limited [place] [facility] but I  believe geriatric psych patients are seen in [place]  She is now living at [facility] ordered this encounter  Medications  . azithromycin (ZITHROMAX Z-PAK) 250 MG tablet    Sig: Take 2 tabs (532m total) on Day 1. Take 1 tab (2592m daily for next 4 days.    Dispense:  6 tablet    Refill:  0  . benzonatate (TESSALON) 100 MG capsule    Sig: Take 1 capsule (100 mg total) by mouth 3 (three) times daily as needed for cough.    Dispense:  30 capsule    Refill:  0  . ondansetron (ZOFRAN) 4 MG tablet    Sig: Take 1 tablet (4 mg total) by mouth every 8 (eight) hours as needed for nausea or vomiting.    Dispense:  30 tablet    Refill:  2  . rOPINIRole (REQUIP) 1 MG tablet    Sig: TAKE 1 TABLET EVERY MORNING AND 2 TABLETS EVERY NIGHT    Dispense:  90 tablet    Refill:  2     Follow up plan: Return if symptoms worsen or fail to improve, for URI bronchitis.  [facility]ie [facility]namD[facility] 07/31/2017, 12:37 PM

## 2017-07-31 NOTE — Patient Instructions (Addendum)
Thank you for coming to the office today.  1. It sounds like you had an Upper Respiratory Virus that has settled into a Bronchitis, lower respiratory tract infection. I don't have concerns for pneumonia today, and think that this should gradually improve. Once you are feeling better, the cough may take a few weeks to fully resolve. I do hear coarse breath sounds, this may be due to the virus, also could be related to smoking. - start Azithromycin Z pak (antibiotic) 2 tabs day 1, then 1 tab x 4 days, complete entire course even if improved - Start OTC Mucinex for 1 week or less, to help clear the mucus - Start Tessalon Perls take 1 capsule up to 3 times a day as needed for cough - Use nasal saline (Simply Saline or Ocean Spray) to flush nasal congestion multiple times a day, may help cough - Drink plenty of fluids to improve congestion  If your symptoms seem to worsen instead of improve over next several days, including significant fever / chills, worsening shortness of breath, worsening wheezing, or nausea / vomiting and can't take medicines - return sooner or go to hospital Emergency Department for more immediate treatment.  You have thick impacted ear wax (cerumen) blocking ear canals and ear drums. This is the most likely cause of reduced hearing and ear pain and discomfort. - After ear flushing in the office, it does appear to have loosened up and is much softer, only partial removal though as the larger pieces are still deeper in.  Recommend using the same ear drops at home in the future if ear wax builds up again, over the counter Debrox (Carbamide peroxide), use on both sides following instructions on bottle, pharmacist will direct you to the appropriate ear drops if you need help. May take a week or more.  Avoid using Q-tips inside ears, as this can push wax deeper, but you can try to use rolled up kleenex as a wick to absorb fluid and wax as well.  If you are not making progress with ear  wax removal at home, or the problem keeps coming back, please notify our office or return for re-evaluation, and we can discuss referral to ENT office for more formal ear wax removal.  [facility] [id number]  [place], [place] [address] Ph: [phone]  -------------------  Please call and reschedule [facility] Psychiatry appointment [phone]  Please call Dr Melrose Nakayama for the [place] rx.  Please schedule a Follow-up Appointment to: Return if symptoms worsen or fail to improve, for URI bronchitis.    If you have any other questions or concerns, please feel free to call the office or send a message through [place]. You may also schedule an earlier appointment if necessary.  Additionally, you may be receiving a survey about your experience at our office within a few days to 1 week by e-mail or mail. We value your feedback.  Nobie Putnam, DO [place]

## 2017-08-09 ENCOUNTER — Ambulatory Visit: Payer: Self-pay | Admitting: Family Medicine

## 2017-08-13 ENCOUNTER — Encounter: Payer: Self-pay | Admitting: Family Medicine

## 2017-08-13 ENCOUNTER — Other Ambulatory Visit: Payer: Self-pay

## 2017-08-13 ENCOUNTER — Ambulatory Visit (INDEPENDENT_AMBULATORY_CARE_PROVIDER_SITE_OTHER): Payer: Medicare Other | Admitting: Family Medicine

## 2017-08-13 VITALS — BP 136/71 | HR 112 | Temp 98.3°F | Ht 66.0 in | Wt 132.0 lb

## 2017-08-13 DIAGNOSIS — H9193 Unspecified hearing loss, bilateral: Secondary | ICD-10-CM

## 2017-08-13 DIAGNOSIS — R42 Dizziness and giddiness: Secondary | ICD-10-CM | POA: Diagnosis not present

## 2017-08-13 DIAGNOSIS — H6123 Impacted cerumen, bilateral: Secondary | ICD-10-CM | POA: Diagnosis not present

## 2017-08-13 NOTE — Progress Notes (Signed)
Subjective:    Patient ID: Paula Frank, female    DOB: 04-07-1941, 77 y.o.   MRN: [id number]  Paula Frank is a 77 y.o. female presenting on 08/13/2017 for Cerumen Impaction (unbalanced for a week. also wants to discuss medication for ears)   HPI  Bilateral Ear Pain / Cerumen Impaction Last visit 1-2 weeks ago on 07/31/17 with URI symptoms and R ear pain with cerumen impaction. Attempted ear lavage that was unsuccessful with dried impacted cerumen and she could not tolerate flushing would not allow [place] to proceed. She was advised to use OTC debrox ear drops. - Today returns now with recurrent bilateral ear discomfort pain and reduced hearing, also with dizziness off balance some vertigo symptoms over past 1 week. Feels fullness and "clogged ears" - She is asking about removal of wax, but does not seem to recall our attempts at last visit - Resolved fever, URI symptoms - Admits reduced PO intake, fluid intake is okay - Admits diarrhea - Denies any general body and muscle aches, but admits some upper back shoulder aches - Denies any vomiting, abdominal pain  PMH Bipolar/Mood Disorder - see last visit 07/31/17 for details, no new change, states difficulty with current living situation and unable to get psychiatry  Depression screen [facility] 2/9 11/21/2016 07/23/2016 02/23/2016  Decreased Interest 0 3 0  Down, Depressed, Hopeless 1 3 0  PHQ - 2 Score 1 6 0  Altered sleeping 1 2 -  Tired, decreased energy 1 2 -  Change in appetite 0 2 -  Feeling bad or failure about yourself  1 3 -  Trouble concentrating 2 3 -  Moving slowly or fidgety/restless 0 3 -  Suicidal thoughts 0 3 -  PHQ-9 Score 6 24 -  Difficult doing work/chores - Extremely dIfficult -    Social History   Tobacco Use  . Smoking status: Current Every Day Smoker    Packs/day: 1.00    Years: 60.00    Pack years: 60.00    Types: Cigarettes  . Smokeless tobacco: Never Used  Substance Use Topics  . Alcohol use: Yes   Alcohol/week: 4.2 - 8.4 oz    Types: 7 - 14 Standard drinks or equivalent per week    Comment: 2 glasses of wine a day  . Drug use: No    Review of Systems Per HPI unless specifically indicated above     Objective:    BP 136/71 (BP Location: Right Arm, Patient Position: Sitting, Cuff Size: Normal)   Pulse (!) 112   Temp 98.3 F (36.8 C)   Ht 5' 6"  (1.676 m)   Wt 132 lb (59.9 kg)   BMI 21.31 kg/m   Wt Readings from Last 3 Encounters:  08/13/17 132 lb (59.9 kg)  07/31/17 129 lb (58.5 kg)  07/10/17 134 lb (60.8 kg)    Physical Exam  Constitutional: She is oriented to person, place, and time. She appears well-developed and well-nourished. No distress.  Chronically ill 77 year old female  HENT:  Head: Normocephalic and atraumatic.  Mouth/Throat: Oropharynx is clear and moist.  Frontal / maxillary sinuses non-tender. Nares patent without purulence or edema. Bilateral TMs obscured by cerumen, bilateral ears are impacted with harder dry cerumen - limited cooperation with exam  Oropharynx with some post nasal drainage in throat, without erythema, exudates, edema or asymmetry.  Eyes: Conjunctivae are normal. Right eye exhibits no discharge. Left eye exhibits no discharge.  Cardiovascular: Normal rate, regular rhythm, normal heart  sounds and intact distal pulses.  No murmur heard. Pulmonary/Chest: Effort normal. No respiratory distress. She has no wheezes. She has no rales (Improved but still mild rhonchi).  Reduced air movement is mostly unchanged from previous exam.  Musculoskeletal: She exhibits no edema.  Lymphadenopathy:    She has no cervical adenopathy.  Neurological: She is alert and oriented to person, place, and time.  Skin: Skin is warm and dry. She is not diaphoretic. No erythema.  Psychiatric: Her behavior is normal.  Well groomed, good eye contact, speech is pressured at times and occasionally more rapid, thoughts are normal has poor insight into health. Chronic labile  mood similar to before.  Nursing note and vitals reviewed.        Assessment & Plan:   Problem List Items Addressed This Visit    None    Visit Diagnoses    Bilateral impacted cerumen    -  Primary      Significant amount of large thick impacted cerumen bilaterally b/l ears, suspected primary cause of current reduced bilateral hearing and ear pain, inner ear / vertigo symptoms  Plan: 1. Did not re attempt ear lavage since did not tolerate our attempts in office last time - advised may need additional assistance for cerumen removal, and likely has vestibular symptoms if sinus vs inner ear issue as well 2. Trial on OTC Debrox drops removal kit and flushing at home 3. Proceed with ENT referral for formal cerumen removal and consider audiology hearing evaluation  Regarding Psychiatry - will attempt to contact social/case worker for patient, see what resources are available, she may need housing / placement / transportation. Additionally will check into Psychiatry referral options, she has declined to return to [facility]. She was scheduled for [facility] but unable to go and did not follow-up.   No orders of the defined types were placed in this encounter.  Orders Placed This Encounter  Procedures  . Ambulatory referral to ENT    Referral Priority:   Routine    Referral Type:   Consultation    Referral Reason:   Specialty Services Required    Requested Specialty:   Otolaryngology    Number of Visits Requested:   1     Follow up plan: Return in about 2 weeks (around 08/27/2017), or if symptoms worsen or fail to improve, for ear wax.  Nobie Putnam, [facility] 08/13/2017, 5:58 PM

## 2017-08-13 NOTE — Patient Instructions (Addendum)
Thank you for coming to the office today.  1.  You have thick impacted ear wax (cerumen) blocking ear canals and ear drums. This is the most likely cause of reduced hearing and ear pain and discomfort. - After ear flushing in the office, it does appear to have loosened up and is much softer, only partial removal though as the larger pieces are still deeper in.  Recommend using the same ear drops at home in the future if ear wax builds up again, over the counter Debrox (Carbamide peroxide), use on both sides following instructions on bottle, pharmacist will direct you to the appropriate ear drops if you need help. May take a week or more.  Avoid using Q-tips inside ears, as this can push wax deeper, but you can try to use rolled up kleenex as a wick to absorb fluid and wax as well.  Referral to ENT office for more formal ear wax removal and dizziness vestibular evaluation  [facility] ENT [facility] [id number]  [place], [place] [address] Ph: [phone]  PSYCHIATRY   Try to get established with a Case Worker or Education officer, museum  We will work on attempting to get you referred to a Print production planner [facility] ([place] [facility]) Address: [address], [place], [place] [address] Hours: 8:30AM-5PM Phone: [phone]  [place] [facility] [place], [place] [address] Phone: [phone]  [facility] (All ages) [address], [place], [id number] Phone: [phone] (Option 1) [email or web address], available walk-in 9am-4pm M-F [place], [place] [address] Hours: 9am - 4pm (M-F, walk in available) [phone]  Please schedule a Follow-up Appointment to: Return in about 2 weeks (around 08/27/2017), or if symptoms worsen or fail to improve, for ear wax.    If you  have any other questions or concerns, please feel free to call the office or send a message through [place]. You may also schedule an earlier appointment if necessary.  Additionally, you may be receiving a survey about your experience at our office within a few days to 1 week by e-mail or mail. We value your feedback.  Nobie Putnam, DO [place]

## 2017-08-14 ENCOUNTER — Ambulatory Visit: Admit: 2017-08-14 | Discharge: 2017-08-15 | Payer: MEDICARE | Attending: Dermatology | Primary: Dermatology

## 2017-08-14 DIAGNOSIS — R21 Rash and other nonspecific skin eruption: Secondary | ICD-10-CM | POA: Diagnosis not present

## 2017-08-14 DIAGNOSIS — L308 Other specified dermatitis: Secondary | ICD-10-CM | POA: Diagnosis not present

## 2017-08-14 DIAGNOSIS — I872 Venous insufficiency (chronic) (peripheral): Secondary | ICD-10-CM | POA: Diagnosis not present

## 2017-08-14 MED ORDER — TRIAMCINOLONE ACETONIDE 0.1 % TOPICAL OINTMENT
2 refills | 0 days | Status: CP
Start: 2017-08-14 — End: ?

## 2017-08-28 ENCOUNTER — Ambulatory Visit: Admit: 2017-08-28 | Discharge: 2017-08-29 | Payer: MEDICARE | Attending: Dermatology | Primary: Dermatology

## 2017-08-28 DIAGNOSIS — L300 Nummular dermatitis: Secondary | ICD-10-CM | POA: Diagnosis not present

## 2017-09-25 ENCOUNTER — Emergency Department: Payer: Medicare Other

## 2017-09-25 ENCOUNTER — Telehealth: Payer: Self-pay

## 2017-09-25 ENCOUNTER — Encounter: Payer: Self-pay | Admitting: Emergency Medicine

## 2017-09-25 ENCOUNTER — Emergency Department
Admission: EM | Admit: 2017-09-25 | Discharge: 2017-09-25 | Disposition: A | Discharge status: Home / Self Care | Payer: Medicare Other | Attending: Student in an Organized Health Care Education/Training Program | Admitting: Student in an Organized Health Care Education/Training Program

## 2017-09-25 DIAGNOSIS — N189 Chronic kidney disease, unspecified: Secondary | ICD-10-CM | POA: Insufficient documentation

## 2017-09-25 DIAGNOSIS — Z79899 Other long term (current) drug therapy: Secondary | ICD-10-CM | POA: Diagnosis not present

## 2017-09-25 DIAGNOSIS — I129 Hypertensive chronic kidney disease with stage 1 through stage 4 chronic kidney disease, or unspecified chronic kidney disease: Secondary | ICD-10-CM | POA: Insufficient documentation

## 2017-09-25 DIAGNOSIS — G308 Other Alzheimer's disease: Secondary | ICD-10-CM | POA: Diagnosis not present

## 2017-09-25 DIAGNOSIS — R112 Nausea with vomiting, unspecified: Secondary | ICD-10-CM

## 2017-09-25 DIAGNOSIS — F0281 Dementia in other diseases classified elsewhere with behavioral disturbance: Secondary | ICD-10-CM | POA: Insufficient documentation

## 2017-09-25 DIAGNOSIS — K5730 Diverticulosis of large intestine without perforation or abscess without bleeding: Secondary | ICD-10-CM | POA: Diagnosis not present

## 2017-09-25 DIAGNOSIS — R1013 Epigastric pain: Secondary | ICD-10-CM | POA: Insufficient documentation

## 2017-09-25 LAB — CBC
HCT: 39.7 % (ref 35.0–47.0)
HEMOGLOBIN: 13.6 g/dL (ref 12.0–16.0)
MCH: 34.7 pg — ABNORMAL HIGH (ref 26.0–34.0)
MCHC: 34.1 g/dL (ref 32.0–36.0)
MCV: 101.6 fL — ABNORMAL HIGH (ref 80.0–100.0)
PLATELETS: 453 10*3/uL — AB (ref 150–440)
RBC: 3.91 MIL/uL (ref 3.80–5.20)
RDW: 15.7 % — ABNORMAL HIGH (ref 11.5–14.5)
WBC: 8.8 10*3/uL (ref 3.6–11.0)

## 2017-09-25 LAB — COMPREHENSIVE METABOLIC PANEL
ALT: 15 U/L (ref 14–54)
AST: 26 U/L (ref 15–41)
Albumin: 3.9 g/dL (ref 3.5–5.0)
Alkaline Phosphatase: 129 U/L — ABNORMAL HIGH (ref 38–126)
Anion gap: 10 (ref 5–15)
BILIRUBIN TOTAL: 0.7 mg/dL (ref 0.3–1.2)
BUN: 18 mg/dL (ref 6–20)
CO2: 27 mmol/L (ref 22–32)
CREATININE: 0.8 mg/dL (ref 0.44–1.00)
Calcium: 9.6 mg/dL (ref 8.9–10.3)
Chloride: 102 mmol/L (ref 101–111)
GFR calc Af Amer: >60 mL/min (ref >60)
Glucose, Bld: 113 mg/dL — ABNORMAL HIGH (ref 65–99)
POTASSIUM: 3.7 mmol/L (ref 3.5–5.1)
Sodium: 139 mmol/L (ref 135–145)
TOTAL PROTEIN: 7.8 g/dL (ref 6.5–8.1)

## 2017-09-25 LAB — URINALYSIS, COMPLETE (UACMP) WITH MICROSCOPIC
Bilirubin Urine: NEGATIVE
GLUCOSE, UA: NEGATIVE mg/dL
Hgb urine dipstick: NEGATIVE
Ketones, ur: 5 mg/dL — AB
NITRITE: NEGATIVE
PH: 6 (ref 5.0–8.0)
Protein, ur: 30 mg/dL — AB
SPECIFIC GRAVITY, URINE: 1.019 (ref 1.005–1.030)

## 2017-09-25 LAB — LIPASE, BLOOD: Lipase: 18 U/L (ref 11–51)

## 2017-09-25 MED ORDER — DICYCLOMINE HCL 10 MG PO CAPS: 10.0000 mg | ORAL_CAPSULE | Freq: Three times a day (TID) | ORAL | 0 refills | Status: DC | PRN

## 2017-09-25 MED ORDER — HYDROCODONE-ACETAMINOPHEN 5-325 MG PO TABS
1.0000 | ORAL_TABLET | Freq: Once | ORAL | Status: AC
  Administered 2017-09-25: 1 via ORAL
  Filled 2017-09-25: qty 1

## 2017-09-25 MED ORDER — SODIUM CHLORIDE 0.9 % IV BOLUS
1000.0000 mL | Freq: Once | INTRAVENOUS | Status: AC
  Administered 2017-09-25: 1000 mL via INTRAVENOUS

## 2017-09-25 MED ORDER — ONDANSETRON HCL 4 MG PO TABS: 4.0000 mg | ORAL_TABLET | Freq: Every day | ORAL | 0 refills | Status: DC | PRN

## 2017-09-25 MED ORDER — PROMETHAZINE HCL 25 MG/ML IJ SOLN
12.5000 mg | Freq: Four times a day (QID) | INTRAMUSCULAR | Status: DC | PRN
  Administered 2017-09-25: 12.5 mg via INTRAVENOUS
  Filled 2017-09-25: qty 1

## 2017-09-25 MED ORDER — ONDANSETRON HCL 4 MG/2ML IJ SOLN
4.0000 mg | Freq: Once | INTRAMUSCULAR | Status: AC | PRN
  Administered 2017-09-25: 4 mg via INTRAVENOUS
  Filled 2017-09-25: qty 2

## 2017-09-25 NOTE — ED Notes (Signed)
Brought patient to room 17 by wheelchair.  Patient got up with assistance to use bedside commode.  When this RN placed pulse ox on patient's finger her oxygen saturation was 88% with consistent waveform.  This RN placed patient on 2L of O2 and notified patient's primary nurse.  Patient still speaking in full sentences and showing no signs of distress.  Patient reports she is a smoker but does not use oxygen at home.

## 2017-09-25 NOTE — ED Notes (Signed)
Patient transported to CT 

## 2017-09-25 NOTE — Telephone Encounter (Signed)
Reviewed message, agree with triage given constellation of symptoms.  Nobie Putnam, [facility] 09/25/2017, 5:04 PM

## 2017-09-25 NOTE — ED Provider Notes (Signed)
[facility] Emergency Department Provider Note    First MD Initiated Contact with [name] 09/25/17 1545     (approximate)  I have reviewed the triage vital signs and the nursing notes.   HISTORY  Chief Complaint Emesis    HPI Paula [name] is a 77 y.o. female history of CKD as well as lupus gastritis and restless leg chronic pain syndrome presents with chief complaint of nausea vomiting with 4 episodes of nonbloody nonbilious emesis over the past 24 hours.  She is been sick since Monday.  Did have fever to 102 yesterday.  Is also having left flank pain.  Does have a history of kidney stones.  Feels that she has had darker and foul-smelling urine but is also complaining of abdominal pain feels that her abdomen is more distended.  Also has a history of diverticulitis and this feels similar.  Discomfort is mild to moderate.  Past Medical History:  Diagnosis Date  . Anxiety   . Chronic kidney disease   . Chronic pain   . Discoid lupus   . Diverticulitis   . Gastritis   . Lupus (systemic lupus erythematosus) ([place])   . Restless leg   . Seizures ([place])    Family History  Problem Relation Age of Onset  . Cancer Mother   . Esophageal cancer Father   . Cancer Son   . Breast cancer Sister    Past Surgical History:  Procedure Laterality Date  . ABDOMINAL HYSTERECTOMY    . APPENDECTOMY    . KNEE ARTHROSCOPY W/ MENISCECTOMY    . LAPAROSCOPY    . LITHOTRIPSY    . ROTATOR CUFF REPAIR    . ROTATOR CUFF REPAIR Bilateral    x2  . TUBAL LIGATION     [name] Active Problem List   Diagnosis Date Noted  . Constipation, chronic 07/11/2017  . COPD (chronic obstructive pulmonary disease) ([place]) 11/21/2016  . Appetite loss 10/09/2016  . Dermatitis 07/23/2016  . Nausea & vomiting 04/09/2016  . Herpes labialis 02/23/2016  . Abdominal swelling, generalized 01/26/2016  . Bilateral arm weakness 10/12/2015  . Difficulty walking 10/12/2015  . Neck pain  10/12/2015  . Rash 10/12/2015  . Spell of altered consciousness 10/12/2015  . 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 Bipolar I disorder, most recent episode depressed, severe without psychotic features ([place]) 02/16/2015  . Alcohol abuse 02/16/2015  . Alzheimer's dementia 02/16/2015  . 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 Current tobacco use 02/19/2014  . Systemic lupus erythematosus ([place]) 01/14/2014  . Seizure ([place]) 10/30/2013  . Disordered sleep 10/30/2013  . Adynamia 10/30/2013  . Essential hypertension 05/19/2012  . Right shoulder injury 05/16/2012  . Fatigue 11/20/2011      Prior to Admission medications   Medication Sig Start Date End Date Taking? Authorizing Provider  Armodafinil (NUVIGIL) 250 MG tablet Take 250 mg by mouth daily. Reported on 12/29/2015    [provider]  azithromycin (ZITHROMAX Z-PAK) 250 MG tablet Take 2 tabs (500mg  total) on Day 1. Take 1 tab (250mg ) daily for next 4 days. [name] not taking: Reported on 08/13/2017 07/31/17   Olin Hauser, DO  benzonatate (TESSALON) 100 MG capsule Take 1 capsule (100 mg total) by mouth 3 (three) times daily as needed for cough. [name] not taking: Reported on 08/13/2017 07/31/17   Olin Hauser, DO  buPROPion (WELLBUTRIN XL) 150 MG 24 hr  tablet Take 1 tablet (150 mg total) by mouth daily. [name] not taking: Reported on 08/13/2017 10/31/15   Clapacs, Madie Reno, MD  clobetasol ointment (TEMOVATE) 4.25 % Apply 1 application topically 2 (two) times daily. Apply to arms and shins.    [provider]  divalproex (DEPAKOTE) 250 MG DR tablet Take 1 tablet (250 mg total) by mouth every 8 (eight) hours. [name] not taking: Reported on 08/13/2017 10/31/15   Clapacs, Madie Reno, MD  doxepin (SINEQUAN) 25 MG capsule TAKE 1 CAPSULE BY MOUTH AT BEDTIME 01/04/16   Clapacs, Madie Reno, MD  fluocinonide  ointment (LIDEX) 9.56 % Apply 2 application topically 2 (two) times daily. 03/14/16   [provider]  fluticasone (FLONASE) 50 MCG/ACT nasal spray Place 2 sprays into both nostrils daily. Use for 4-6 weeks then stop and use seasonally or as needed. 10/09/16   Karamalegos, Devonne Doughty, DO  halobetasol (ULTRAVATE) 0.05 % ointment Apply 1 application topically 2 (two) times daily. 06/06/15   [provider]  hydrOXYzine (ATARAX/VISTARIL) 10 MG tablet Take 1 tablet (10 mg total) by mouth 3 (three) times daily as needed for itching. [name] not taking: Reported on 08/13/2017 07/17/16   Nance Pear, MD  lamoTRIgine (LAMICTAL) 25 MG tablet Wk1 1 pm wk2 1 2xday wk3 1 am 2 pm wk4 2 2xday wk5 2 am 3 pm wk6 3 2x day wk7 3 am 4 pm wk8 4 2xday wk9 4 am 5 pm wk 10 5 2xday 09/05/16   [provider]  loratadine (CLARITIN) 10 MG tablet Take 1 tablet (10 mg total) by mouth daily. 07/23/16   Arlis Porta., MD  montelukast (SINGULAIR) 10 MG tablet TAKE 1 TABLET (10 MG TOTAL) BY MOUTH DAILY. 01/19/16   Arlis Porta., MD  naproxen (NAPROSYN) 500 MG tablet Take 1 tablet (500 mg total) by mouth 2 (two) times daily with a meal. [name] not taking: Reported on 08/13/2017 06/14/15   Carrie Mew, MD  ondansetron (ZOFRAN) 4 MG tablet Take 1 tablet (4 mg total) by mouth every 8 (eight) hours as needed for nausea or vomiting. 07/31/17   Karamalegos, Devonne Doughty, DO  QUEtiapine (SEROQUEL) 400 MG tablet Take 400 mg by mouth at bedtime. Reported on 12/29/2015    [provider]  ranitidine (ZANTAC) 150 MG tablet Take by mouth.    [provider]  rOPINIRole (REQUIP) 1 MG tablet TAKE 1 TABLET EVERY MORNING AND 2 TABLETS EVERY NIGHT 07/31/17   Karamalegos, Devonne Doughty, DO  triamcinolone (KENALOG) 0.025 % ointment Apply 1-2 times a day to affected areas as needed until smooth, repeat if needed. Avoid on face and skin folds. 08/20/16   [provider]  triamcinolone  ointment (KENALOG) 0.5 % Apply 1 application topically 2 (two) times daily. 07/17/16   Nance Pear, MD    Allergies Amoxicillin; Ioxaglate; Ivp dye [iodinated diagnostic agents]; Levaquin [levofloxacin in d5w]; Levofloxacin; Moxifloxacin hcl in nacl; Erythromycin; and Sulfa antibiotics    Social History Social History   Tobacco Use  . Smoking status: Current Every Day Smoker    Packs/day: 1.00    Years: 60.00    Pack years: 60.00    Types: Cigarettes  . Smokeless tobacco: Never Used  Substance Use Topics  . Alcohol use: Yes    Alcohol/week: 4.2 - 8.4 oz    Types: 7 - 14 Standard drinks or equivalent per week    Comment: 2 glasses of wine a day  . Drug use: No  Review of Systems [name] denies headaches, rhinorrhea, blurry vision, numbness, shortness of breath, chest pain, edema, cough, abdominal pain, nausea, vomiting, diarrhea, dysuria, fevers, rashes or hallucinations unless otherwise stated above in HPI. ____________________________________________   PHYSICAL EXAM:  VITAL SIGNS: Vitals:   09/25/17 1456  BP: (!) 156/82  Pulse: (!) 103  Resp: 20  Temp: 98.5 F (36.9 C)  SpO2: 97%    Constitutional: Alert and oriented. Chronically ill appearing with TD in no acute distress. Eyes: Conjunctivae are normal.  Head: Atraumatic. Nose: No congestion/rhinnorhea. Mouth/Throat: Mucous membranes are moist.   Neck: No stridor. Painless ROM.  Cardiovascular: Normal rate, regular rhythm. Grossly normal heart sounds.  Good peripheral circulation. Respiratory: Normal respiratory effort.  No retractions. Lungs CTAB. Gastrointestinal: Soft mildly ttp in rlq and llq region. mild  distention. No fluid wave. No abdominal bruits.+ left CVA tenderness. Genitourinary:  Musculoskeletal: No lower extremity tenderness nor edema.  No joint effusions. Neurologic:  Normal speech and language. No gross focal neurologic deficits are appreciated. No facial droop Skin:  Skin is warm, dry  and intact. No rash noted. Psychiatric: Mood and affect are normal. Speech and behavior are normal.  ____________________________________________   LABS (all labs ordered are listed, but only abnormal results are displayed)  Results for orders placed or performed during the hospital encounter of 09/25/17 (from the past 24 hour(s))  Lipase, blood     Status: None   Collection Time: 09/25/17  3:02 PM  Result Value Ref Range   Lipase 18 11 - 51 U/L  Comprehensive metabolic panel     Status: Abnormal   Collection Time: 09/25/17  3:02 PM  Result Value Ref Range   Sodium 139 135 - 145 mmol/L   Potassium 3.7 3.5 - 5.1 mmol/L   Chloride 102 101 - 111 mmol/L   CO2 27 22 - 32 mmol/L   Glucose, Bld 113 (H) 65 - 99 mg/dL   BUN 18 6 - 20 mg/dL   Creatinine, Ser 0.80 0.44 - 1.00 mg/dL   Calcium 9.6 8.9 - 10.3 mg/dL   Total Protein 7.8 6.5 - 8.1 g/dL   Albumin 3.9 3.5 - 5.0 g/dL   AST 26 15 - 41 U/L   ALT 15 14 - 54 U/L   Alkaline Phosphatase 129 (H) 38 - 126 U/L   Total Bilirubin 0.7 0.3 - 1.2 mg/dL   GFR calc non Af Amer >60 >60 mL/min   GFR calc Af Amer >60 >60 mL/min   Anion gap 10 5 - 15  CBC     Status: Abnormal   Collection Time: 09/25/17  3:02 PM  Result Value Ref Range   WBC 8.8 3.6 - 11.0 K/uL   RBC 3.91 3.80 - 5.20 MIL/uL   Hemoglobin 13.6 12.0 - 16.0 g/dL   HCT 39.7 35.0 - 47.0 %   MCV 101.6 (H) 80.0 - 100.0 fL   MCH 34.7 (H) 26.0 - 34.0 pg   MCHC 34.1 32.0 - 36.0 g/dL   RDW 15.7 (H) 11.5 - 14.5 %   Platelets 453 (H) 150 - 440 K/uL   ____________________________________________  EKG____________________________________________  RADIOLOGY  I personally reviewed all radiographic images ordered to evaluate for the above acute complaints and reviewed radiology reports and findings.  These findings were personally discussed with the [name].  Please see medical record for radiology report.  ____________________________________________   PROCEDURES  Procedure(s)  performed:  Procedures    Critical Care performed: no ____________________________________________   INITIAL IMPRESSION / ASSESSMENT  AND PLAN / ED COURSE  Pertinent labs & imaging results that were available during my care of the [name] were reviewed by me and considered in my medical decision making (see chart for details).  DDX: Enteritis, gastritis, obstruction, UTI, Pyelo, stone  Paula [name] is a 77 y.o. who presents to the ED with symptoms as described above.  [name]'s primary symptoms are nausea vomiting epigastric discomfort and abdominal distention.  Does not appear to be in any acute distress.  CT imaging will be ordered to evaluate for the above differential.  There was some report of her being hypoxic in triage however the [name] is satting 96% on room air with a good waveform.  Clinical Course as of Sep 26 1702  Wed Sep 25, 2017  1702 CT imaging shows no acute abnormality.  Most likely symptoms are secondary to enteritis.  [name] given IV fluids.  Tolerating oral hydration.  [name] will be discharged and is stable for outpatient follow-up.   [PR]    Clinical Course User Index [PR] Merlyn Lot, MD     As part of my medical decision making, I reviewed the following data within the [facility] notes reviewed and incorporated, Labs reviewed, notes from prior ED visits and [place] Controlled Substance Database   ____________________________________________   FINAL CLINICAL IMPRESSION(S) / ED DIAGNOSES  Final diagnoses:  Non-intractable vomiting with nausea, unspecified vomiting type  Epigastric pain      NEW MEDICATIONS STARTED DURING THIS VISIT:  New Prescriptions   No medications on file     Note:  This document was prepared using Dragon voice recognition software and may include unintentional dictation errors.    Merlyn Lot, MD 09/25/17 [phone]

## 2017-09-25 NOTE — Discharge Instructions (Addendum)

## 2017-09-25 NOTE — Telephone Encounter (Signed)
Pt called complaining of persistent vomiting x 2 days , fever 102 temp and shoulder pain. X 2-3 days. I then spoke w/ the patient caregiver who informed me that she only vomited once today. I spoke w/ the patient caregiver [name] who  Informed me the patient only vomited once. Pt was advised if vomiting been going on for 2-3 days she needs to go to ER or [facility], because she is possibly dehydrated and need IV fluids. She verbalize understanding, no questions or concerns.

## 2017-09-25 NOTE — ED Notes (Signed)
Pt up to toilet 

## 2017-09-25 NOTE — ED Triage Notes (Signed)
Pt arrived with complaints of nausea and vomiting since Sunday. Pt reports 4 episodes of emesis in the last 24 hours. Pt alert, oriented x 4, and in NAD. Pt states she has lower abdominal pain and lower back pain. Pt denies any upper back pain or radiation of pain.

## 2017-09-30 ENCOUNTER — Ambulatory Visit: Admit: 2017-09-30 | Discharge: 2017-10-01 | Payer: MEDICARE

## 2017-09-30 DIAGNOSIS — L309 Dermatitis, unspecified: Secondary | ICD-10-CM | POA: Diagnosis not present

## 2017-09-30 DIAGNOSIS — L820 Inflamed seborrheic keratosis: Secondary | ICD-10-CM | POA: Diagnosis not present

## 2017-09-30 DIAGNOSIS — L308 Other specified dermatitis: Secondary | ICD-10-CM | POA: Diagnosis not present

## 2017-11-04 ENCOUNTER — Encounter: Payer: Self-pay | Admitting: Emergency Medicine

## 2017-11-04 ENCOUNTER — Emergency Department
Admission: EM | Admit: 2017-11-04 | Discharge: 2017-11-05 | Disposition: A | Discharge status: Home / Self Care | Payer: Medicare Other | Attending: Emergency Medicine | Admitting: Emergency Medicine

## 2017-11-04 ENCOUNTER — Other Ambulatory Visit: Payer: Self-pay

## 2017-11-04 DIAGNOSIS — Z79899 Other long term (current) drug therapy: Secondary | ICD-10-CM | POA: Diagnosis not present

## 2017-11-04 DIAGNOSIS — R413 Other amnesia: Secondary | ICD-10-CM | POA: Diagnosis present

## 2017-11-04 DIAGNOSIS — F0280 Dementia in other diseases classified elsewhere without behavioral disturbance: Secondary | ICD-10-CM | POA: Insufficient documentation

## 2017-11-04 DIAGNOSIS — N390 Urinary tract infection, site not specified: Secondary | ICD-10-CM

## 2017-11-04 DIAGNOSIS — F329 Major depressive disorder, single episode, unspecified: Secondary | ICD-10-CM | POA: Diagnosis not present

## 2017-11-04 DIAGNOSIS — N189 Chronic kidney disease, unspecified: Secondary | ICD-10-CM | POA: Insufficient documentation

## 2017-11-04 DIAGNOSIS — G309 Alzheimer's disease, unspecified: Secondary | ICD-10-CM | POA: Diagnosis not present

## 2017-11-04 DIAGNOSIS — F314 Bipolar disorder, current episode depressed, severe, without psychotic features: Secondary | ICD-10-CM | POA: Diagnosis not present

## 2017-11-04 DIAGNOSIS — I129 Hypertensive chronic kidney disease with stage 1 through stage 4 chronic kidney disease, or unspecified chronic kidney disease: Secondary | ICD-10-CM | POA: Insufficient documentation

## 2017-11-04 DIAGNOSIS — F419 Anxiety disorder, unspecified: Secondary | ICD-10-CM | POA: Diagnosis not present

## 2017-11-04 DIAGNOSIS — F17210 Nicotine dependence, cigarettes, uncomplicated: Secondary | ICD-10-CM | POA: Insufficient documentation

## 2017-11-04 DIAGNOSIS — J449 Chronic obstructive pulmonary disease, unspecified: Secondary | ICD-10-CM | POA: Diagnosis not present

## 2017-11-04 DIAGNOSIS — F1994 Other psychoactive substance use, unspecified with psychoactive substance-induced mood disorder: Secondary | ICD-10-CM

## 2017-11-04 DIAGNOSIS — M329 Systemic lupus erythematosus, unspecified: Secondary | ICD-10-CM | POA: Diagnosis present

## 2017-11-04 DIAGNOSIS — F1010 Alcohol abuse, uncomplicated: Secondary | ICD-10-CM | POA: Diagnosis not present

## 2017-11-04 DIAGNOSIS — F0390 Unspecified dementia without behavioral disturbance: Secondary | ICD-10-CM

## 2017-11-04 LAB — URINALYSIS, COMPLETE (UACMP) WITH MICROSCOPIC
Bilirubin Urine: NEGATIVE
GLUCOSE, UA: NEGATIVE mg/dL
HGB URINE DIPSTICK: NEGATIVE
Ketones, ur: NEGATIVE mg/dL
NITRITE: POSITIVE — AB
PH: 5 (ref 5.0–8.0)
Protein, ur: NEGATIVE mg/dL
Specific Gravity, Urine: 1.013 (ref 1.005–1.030)

## 2017-11-04 LAB — CBC
HCT: 37.6 % (ref 35.0–47.0)
HEMOGLOBIN: 12.9 g/dL (ref 12.0–16.0)
MCH: 35.2 pg — AB (ref 26.0–34.0)
MCHC: 34.2 g/dL (ref 32.0–36.0)
MCV: 102.8 fL — ABNORMAL HIGH (ref 80.0–100.0)
PLATELETS: 427 10*3/uL (ref 150–440)
RBC: 3.65 MIL/uL — ABNORMAL LOW (ref 3.80–5.20)
RDW: 15.1 % — AB (ref 11.5–14.5)
WBC: 6.9 10*3/uL (ref 3.6–11.0)

## 2017-11-04 LAB — COMPREHENSIVE METABOLIC PANEL
ALBUMIN: 3.5 g/dL (ref 3.5–5.0)
ALT: 17 U/L (ref 14–54)
ANION GAP: 9 (ref 5–15)
AST: 23 U/L (ref 15–41)
Alkaline Phosphatase: 123 U/L (ref 38–126)
BUN: 15 mg/dL (ref 6–20)
CALCIUM: 9.2 mg/dL (ref 8.9–10.3)
CHLORIDE: 98 mmol/L — AB (ref 101–111)
CO2: 28 mmol/L (ref 22–32)
Creatinine, Ser: 1.04 mg/dL — ABNORMAL HIGH (ref 0.44–1.00)
GFR calc Af Amer: 59 mL/min — ABNORMAL LOW (ref >60)
GFR calc non Af Amer: 51 mL/min — ABNORMAL LOW (ref >60)
GLUCOSE: 112 mg/dL — AB (ref 65–99)
POTASSIUM: 3.9 mmol/L (ref 3.5–5.1)
SODIUM: 135 mmol/L (ref 135–145)
Total Bilirubin: 0.3 mg/dL (ref 0.3–1.2)
Total Protein: 7.3 g/dL (ref 6.5–8.1)

## 2017-11-04 LAB — ETHANOL: Alcohol, Ethyl (B): <10 mg/dL (ref <10)

## 2017-11-04 MED ORDER — LORAZEPAM 2 MG PO TABS
0.0000 mg | ORAL_TABLET | Freq: Four times a day (QID) | ORAL | Status: DC
  Administered 2017-11-04: 2 mg via ORAL
  Administered 2017-11-05: 1 mg via ORAL
  Administered 2017-11-05: 2 mg via ORAL
  Filled 2017-11-04 (×2): qty 1

## 2017-11-04 MED ORDER — QUETIAPINE FUMARATE 200 MG PO TABS
400.0000 mg | ORAL_TABLET | Freq: Every day | ORAL | Status: DC
  Administered 2017-11-04: 400 mg via ORAL
  Filled 2017-11-04: qty 2

## 2017-11-04 MED ORDER — LORAZEPAM 2 MG/ML IJ SOLN: 0.0000 mg | Freq: Four times a day (QID) | INTRAMUSCULAR | Status: DC

## 2017-11-04 MED ORDER — LORAZEPAM 2 MG/ML IJ SOLN: 0.0000 mg | Freq: Two times a day (BID) | INTRAMUSCULAR | Status: DC

## 2017-11-04 MED ORDER — LORAZEPAM 2 MG PO TABS: 0.0000 mg | ORAL_TABLET | Freq: Two times a day (BID) | ORAL | Status: DC

## 2017-11-04 MED ORDER — THIAMINE HCL 100 MG/ML IJ SOLN: 100.0000 mg | Freq: Every day | INTRAMUSCULAR | Status: DC

## 2017-11-04 MED ORDER — VITAMIN B-1 100 MG PO TABS
100.0000 mg | ORAL_TABLET | Freq: Every day | ORAL | Status: DC
  Administered 2017-11-05: 100 mg via ORAL
  Filled 2017-11-04: qty 1

## 2017-11-04 MED ORDER — IBUPROFEN 600 MG PO TABS
600.0000 mg | ORAL_TABLET | Freq: Three times a day (TID) | ORAL | Status: DC | PRN
  Administered 2017-11-05: 600 mg via ORAL
  Filled 2017-11-04: qty 1

## 2017-11-04 NOTE — ED Notes (Addendum)
Patient states she is here for detox. Patient denies SI/HI/AVH. Patient states she drinks a bottle of wine a day. But today has not drank as much. Patient states she detoxed from [facility] 35 years ago and started back drinking 5 years ago. Patient states she has a seizure disorder but could not voice to this nurse when she had her last seizure. Patient states her last drink was this afternoon. Patient has involuntary movement in shoulders.
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
## 2017-11-04 NOTE — ED Notes (Signed)
TTS in with patient.  

## 2017-11-04 NOTE — ED Notes (Signed)
Patient unable to remove ring on left pinky.

## 2017-11-04 NOTE — ED Triage Notes (Signed)
Pt brought in by [facility] PD from [place] states she is here for alcohol detox. States has had wine today, denies any other drug use. Pt denies any SI or HI at this time.

## 2017-11-05 DIAGNOSIS — F314 Bipolar disorder, current episode depressed, severe, without psychotic features: Secondary | ICD-10-CM

## 2017-11-05 DIAGNOSIS — F1994 Other psychoactive substance use, unspecified with psychoactive substance-induced mood disorder: Secondary | ICD-10-CM

## 2017-11-05 DIAGNOSIS — F1010 Alcohol abuse, uncomplicated: Secondary | ICD-10-CM | POA: Diagnosis not present

## 2017-11-05 LAB — URINE DRUG SCREEN, QUALITATIVE (ARMC ONLY)
AMPHETAMINES, UR SCREEN: NOT DETECTED
BARBITURATES, UR SCREEN: NOT DETECTED
BENZODIAZEPINE, UR SCRN: POSITIVE — AB
CANNABINOID 50 NG, UR ~~LOC~~: NOT DETECTED
Cocaine Metabolite,Ur ~~LOC~~: NOT DETECTED
MDMA (Ecstasy)Ur Screen: NOT DETECTED
Methadone Scn, Ur: NOT DETECTED
OPIATE, UR SCREEN: NOT DETECTED
PHENCYCLIDINE (PCP) UR S: NOT DETECTED
Tricyclic, Ur Screen: POSITIVE — AB

## 2017-11-05 MED ORDER — NICOTINE 21 MG/24HR TD PT24
21.0000 mg | MEDICATED_PATCH | Freq: Once | TRANSDERMAL | Status: DC
  Administered 2017-11-05: 21 mg via TRANSDERMAL

## 2017-11-05 MED ORDER — NICOTINE 21 MG/24HR TD PT24
MEDICATED_PATCH | TRANSDERMAL | Status: AC
  Filled 2017-11-05: qty 1

## 2017-11-05 MED ORDER — LORAZEPAM 1 MG PO TABS
ORAL_TABLET | ORAL | Status: AC
  Administered 2017-11-05: 1 mg via ORAL
  Filled 2017-11-05: qty 1

## 2017-11-05 MED ORDER — NITROFURANTOIN MONOHYD MACRO 100 MG PO CAPS
100.0000 mg | ORAL_CAPSULE | Freq: Two times a day (BID) | ORAL | Status: DC
  Administered 2017-11-05 (×3): 100 mg via ORAL
  Filled 2017-11-05 (×3): qty 1

## 2017-11-05 NOTE — ED Notes (Signed)
Patient observed lying in bed with eyes closed  Even, unlabored respirations observed   NAD pt appears to be sleeping  I will continue to monitor along with every 15 minute visual observations and ongoing security monitoring    

## 2017-11-05 NOTE — ED Provider Notes (Signed)
-----------------------------------------   12:42 PM on 11/05/2017 -----------------------------------------  Patient has been seen and cleared by psychiatry, they have rescinded the patient's IVC.  Currently awaiting social work evaluation.   Harvest Dark, MD 11/05/17 1242

## 2017-11-05 NOTE — ED Provider Notes (Signed)
-----------------------------------------   6:51 AM on 11/05/2017 -----------------------------------------   Blood pressure 124/65, pulse (!) 107, temperature 98.4 F (36.9 C), temperature source Oral, resp. rate 20, height 5\' 6"  (1.676 m), weight 60.8 kg (134 lb), SpO2 95 %.  The patient had no acute events since last update.  Calm and cooperative at this time.  Disposition is pending CSW/Psychiatry/Behavioral Medicine team recommendations.  Antibiotic ordered for UTI.     Paulette Blanch, MD 11/05/17 [phone]

## 2017-11-05 NOTE — ED Notes (Signed)
BEHAVIORAL HEALTH ROUNDING Patient sleeping: No. Patient alert and oriented: yes Behavior appropriate: Yes.  ; If no, describe:  Nutrition and fluids offered: yes Toileting and hygiene offered: Yes  Sitter present: q15 minute observations and security monitoring Law enforcement present: Yes    

## 2017-11-05 NOTE — ED Notes (Signed)
BEHAVIORAL HEALTH ROUNDING Patient sleeping: Yes.   Patient alert and oriented: eyes closed  Appears to be asleep Behavior appropriate: Yes.  ; If no, describe:  Nutrition and fluids offered: Yes  Toileting and hygiene offered: sleeping Sitter present: q 15 minute observations and security monitoring Law enforcement present: yes   
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
## 2017-11-05 NOTE — Discharge Instructions (Signed)
These do not drink, or drink alcohol in moderation.  Please maintain safety in her home, including fire safety at all times.  Return to the emergency department if you develop severe pain, lightheadedness or fainting, chest pain, inability to keep down fluids, fever, or any other symptoms concerning to you.

## 2017-11-05 NOTE — ED Notes (Signed)
1 mg ativan given for CIWA=8

## 2017-11-05 NOTE — ED Notes (Signed)
ABT started for UTI.

## 2017-11-05 NOTE — ED Notes (Signed)
ED  Is the patient under IVC or is there intent for IVC: Yes.   Is the patient medically cleared: Yes.   Is there vacancy in the ED BHU: Yes.   Is the population mix appropriate for patient: Yes.   Is the patient awaiting placement in inpatient or outpatient setting: Yes.   Has the patient had a psychiatric consult: Yes.   - pt to discharged to home per [place]  Survey of unit performed for contraband, proper placement and condition of furniture, tampering with fixtures in bathroom, shower, and each patient room: Yes.  ; Findings:  APPEARANCE/BEHAVIOR Calm and cooperative NEURO ASSESSMENT Orientation: alert / oriented  Denies pain Hallucinations: No.None noted (Hallucinations) denies  Speech: Normal Gait: normal RESPIRATORY ASSESSMENT Even  Unlabored respirations  CARDIOVASCULAR ASSESSMENT Pulses equal   regular rate  Skin warm and dry   GASTROINTESTINAL ASSESSMENT no GI complaint EXTREMITIES Full ROM  PLAN OF CARE Provide calm/safe environment. Vital signs assessed twice daily. ED BHU Assessment once each 12-hour shift. Collaborate with TTS daily or as condition indicates. Assure the ED provider has rounded once each shift. Provide and encourage hygiene. Provide redirection as needed. Assess for escalating behavior; address immediately and inform ED provider.  Assess family dynamic and appropriateness for visitation as needed: Yes.  ; If necessary, describe findings:  Educate the patient/family about BHU procedures/visitation: Yes.  ; If necessary, describe findings:

## 2017-11-05 NOTE — ED Notes (Signed)
Patient discharged to home. Paula Frank patient home to [employer]. Patient denies SI/HI/AVH. Discharge paperwork reviewed with patient. Patient stated understanding of paperwork and signed discharge.
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
## 2017-11-05 NOTE — ED Notes (Signed)
BEHAVIORAL HEALTH ROUNDING Patient sleeping: Yes.   Patient alert and oriented: eyes closed  Appears to be asleep Behavior appropriate: Yes.  ; If no, describe:  Nutrition and fluids offered: Yes  Toileting and hygiene offered: sleeping Sitter present: q 15 minute observations and security monitoring Law enforcement present: yes    ENVIRONMENTAL ASSESSMENT Potentially harmful objects out of patient reach: Yes.   Personal belongings secured: Yes.   Patient dressed in hospital provided attire only: Yes.   Plastic bags out of patient reach: Yes.   Patient care equipment (cords, cables, call bells, lines, and drains) shortened, removed, or accounted for: Yes.   Equipment and supplies removed from bottom of stretcher: Yes.   Potentially toxic materials out of patient reach: Yes.   Sharps container removed or out of patient reach: Yes.
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
## 2017-11-05 NOTE — ED Notes (Signed)
Patient up in room walking around. Patient redirected to lay down, because of medications can cause her to be unsteady when walking.

## 2017-11-05 NOTE — ED Notes (Signed)
Patient had inconstant episode in bed. This Probation officer with Tech cleaned patient and changed sheets on bed.

## 2017-11-06 ENCOUNTER — Telehealth: Payer: Self-pay

## 2017-11-06 NOTE — Telephone Encounter (Signed)
I have made the 2nd attempt to contact the patient or family member in charge, in order to follow up from recently being discharged from the hospital.i was unable to leave a message on voicemail

## 2017-11-06 NOTE — Telephone Encounter (Signed)
I have made the 1st attempt to contact the patient or family member in charge, in order to follow up from recently being discharged from the hospital. I was unable to leave a message on voicemail but I will make another attempt at a different time.  

## 2017-11-16 ENCOUNTER — Observation Stay: Payer: Medicare Other

## 2017-11-16 ENCOUNTER — Emergency Department: Payer: Medicare Other

## 2017-11-16 ENCOUNTER — Other Ambulatory Visit: Payer: Self-pay

## 2017-11-16 ENCOUNTER — Observation Stay
Admission: EM | Admit: 2017-11-16 | Discharge: 2017-11-18 | Disposition: A | Discharge status: Nursing Home (Includes ICF) | Payer: Medicare Other | Attending: Internal Medicine | Admitting: Internal Medicine

## 2017-11-16 ENCOUNTER — Encounter: Payer: Self-pay | Admitting: Emergency Medicine

## 2017-11-16 DIAGNOSIS — R29810 Facial weakness: Secondary | ICD-10-CM | POA: Diagnosis not present

## 2017-11-16 DIAGNOSIS — L219 Seborrheic dermatitis, unspecified: Secondary | ICD-10-CM | POA: Insufficient documentation

## 2017-11-16 DIAGNOSIS — F17210 Nicotine dependence, cigarettes, uncomplicated: Secondary | ICD-10-CM | POA: Insufficient documentation

## 2017-11-16 DIAGNOSIS — I639 Cerebral infarction, unspecified: Secondary | ICD-10-CM

## 2017-11-16 DIAGNOSIS — G309 Alzheimer's disease, unspecified: Secondary | ICD-10-CM | POA: Diagnosis not present

## 2017-11-16 DIAGNOSIS — Z7951 Long term (current) use of inhaled steroids: Secondary | ICD-10-CM | POA: Insufficient documentation

## 2017-11-16 DIAGNOSIS — I129 Hypertensive chronic kidney disease with stage 1 through stage 4 chronic kidney disease, or unspecified chronic kidney disease: Secondary | ICD-10-CM | POA: Insufficient documentation

## 2017-11-16 DIAGNOSIS — R4701 Aphasia: Secondary | ICD-10-CM | POA: Diagnosis not present

## 2017-11-16 DIAGNOSIS — I10 Essential (primary) hypertension: Secondary | ICD-10-CM | POA: Diagnosis present

## 2017-11-16 DIAGNOSIS — G40909 Epilepsy, unspecified, not intractable, without status epilepticus: Secondary | ICD-10-CM | POA: Diagnosis not present

## 2017-11-16 DIAGNOSIS — G2581 Restless legs syndrome: Secondary | ICD-10-CM | POA: Insufficient documentation

## 2017-11-16 DIAGNOSIS — F0280 Dementia in other diseases classified elsewhere without behavioral disturbance: Secondary | ICD-10-CM | POA: Insufficient documentation

## 2017-11-16 DIAGNOSIS — G8929 Other chronic pain: Secondary | ICD-10-CM | POA: Insufficient documentation

## 2017-11-16 DIAGNOSIS — F419 Anxiety disorder, unspecified: Secondary | ICD-10-CM | POA: Insufficient documentation

## 2017-11-16 DIAGNOSIS — L309 Dermatitis, unspecified: Secondary | ICD-10-CM | POA: Diagnosis not present

## 2017-11-16 DIAGNOSIS — N189 Chronic kidney disease, unspecified: Secondary | ICD-10-CM | POA: Insufficient documentation

## 2017-11-16 DIAGNOSIS — G459 Transient cerebral ischemic attack, unspecified: Secondary | ICD-10-CM | POA: Diagnosis not present

## 2017-11-16 DIAGNOSIS — F314 Bipolar disorder, current episode depressed, severe, without psychotic features: Secondary | ICD-10-CM | POA: Diagnosis present

## 2017-11-16 DIAGNOSIS — F319 Bipolar disorder, unspecified: Secondary | ICD-10-CM | POA: Diagnosis not present

## 2017-11-16 DIAGNOSIS — R2689 Other abnormalities of gait and mobility: Secondary | ICD-10-CM | POA: Insufficient documentation

## 2017-11-16 DIAGNOSIS — R569 Unspecified convulsions: Secondary | ICD-10-CM

## 2017-11-16 DIAGNOSIS — R000 Tachycardia, unspecified: Secondary | ICD-10-CM | POA: Diagnosis not present

## 2017-11-16 DIAGNOSIS — Z79899 Other long term (current) drug therapy: Secondary | ICD-10-CM | POA: Diagnosis not present

## 2017-11-16 DIAGNOSIS — L930 Discoid lupus erythematosus: Secondary | ICD-10-CM | POA: Diagnosis not present

## 2017-11-16 DIAGNOSIS — R4781 Slurred speech: Principal | ICD-10-CM | POA: Insufficient documentation

## 2017-11-16 LAB — COMPREHENSIVE METABOLIC PANEL
ALT: 18 U/L (ref 14–54)
AST: 24 U/L (ref 15–41)
Albumin: 3.3 g/dL — ABNORMAL LOW (ref 3.5–5.0)
Alkaline Phosphatase: 99 U/L (ref 38–126)
Anion gap: 3 — ABNORMAL LOW (ref 5–15)
BILIRUBIN TOTAL: 0.2 mg/dL — AB (ref 0.3–1.2)
BUN: 20 mg/dL (ref 6–20)
CHLORIDE: 104 mmol/L (ref 101–111)
CO2: 29 mmol/L (ref 22–32)
CREATININE: 1.13 mg/dL — AB (ref 0.44–1.00)
Calcium: 9.5 mg/dL (ref 8.9–10.3)
GFR, EST AFRICAN AMERICAN: 53 mL/min — AB (ref >60)
GFR, EST NON AFRICAN AMERICAN: 46 mL/min — AB (ref >60)
Glucose, Bld: 87 mg/dL (ref 65–99)
POTASSIUM: 4.1 mmol/L (ref 3.5–5.1)
Sodium: 136 mmol/L (ref 135–145)
TOTAL PROTEIN: 6.8 g/dL (ref 6.5–8.1)

## 2017-11-16 LAB — DIFFERENTIAL
BASOS PCT: 1 %
Basophils Absolute: 0 10*3/uL (ref 0–0.1)
EOS ABS: 0.3 10*3/uL (ref 0–0.7)
Eosinophils Relative: 4 %
LYMPHS ABS: 1.2 10*3/uL (ref 1.0–3.6)
Lymphocytes Relative: 15 %
MONO ABS: 0.7 10*3/uL (ref 0.2–0.9)
MONOS PCT: 9 %
Neutro Abs: 5.9 10*3/uL (ref 1.4–6.5)
Neutrophils Relative %: 71 %

## 2017-11-16 LAB — PROTIME-INR
INR: 0.88
Prothrombin Time: 11.9 seconds (ref 11.4–15.2)

## 2017-11-16 LAB — CBC
HEMATOCRIT: 34.4 % — AB (ref 35.0–47.0)
Hemoglobin: 11.9 g/dL — ABNORMAL LOW (ref 12.0–16.0)
MCH: 35.7 pg — AB (ref 26.0–34.0)
MCHC: 34.6 g/dL (ref 32.0–36.0)
MCV: 103.3 fL — AB (ref 80.0–100.0)
Platelets: 420 10*3/uL (ref 150–440)
RBC: 3.33 MIL/uL — ABNORMAL LOW (ref 3.80–5.20)
RDW: 15 % — ABNORMAL HIGH (ref 11.5–14.5)
WBC: 8.1 10*3/uL (ref 3.6–11.0)

## 2017-11-16 LAB — APTT: aPTT: 36 seconds (ref 24–36)

## 2017-11-16 LAB — GLUCOSE, CAPILLARY: GLUCOSE-CAPILLARY: 70 mg/dL (ref 65–99)

## 2017-11-16 LAB — ETHANOL

## 2017-11-16 LAB — TROPONIN I

## 2017-11-16 MED ORDER — LORATADINE 10 MG PO TABS
10.0000 mg | ORAL_TABLET | Freq: Every day | ORAL | Status: DC
  Administered 2017-11-17 – 2017-11-18 (×2): 10 mg via ORAL
  Filled 2017-11-16 (×2): qty 1

## 2017-11-16 MED ORDER — MORPHINE SULFATE (PF) 2 MG/ML IV SOLN
1.0000 mg | INTRAVENOUS | Status: DC | PRN
  Administered 2017-11-16 – 2017-11-17 (×4): 1 mg via INTRAVENOUS
  Filled 2017-11-16 (×4): qty 1

## 2017-11-16 MED ORDER — ACETAMINOPHEN 325 MG PO TABS
650.0000 mg | ORAL_TABLET | Freq: Four times a day (QID) | ORAL | Status: DC | PRN
  Administered 2017-11-18: 650 mg via ORAL
  Filled 2017-11-16: qty 2

## 2017-11-16 MED ORDER — DOXEPIN HCL 25 MG PO CAPS
25.0000 mg | ORAL_CAPSULE | Freq: Every day | ORAL | Status: DC
  Administered 2017-11-16 – 2017-11-17 (×2): 25 mg via ORAL
  Filled 2017-11-16 (×3): qty 1

## 2017-11-16 MED ORDER — LORAZEPAM 2 MG/ML IJ SOLN
1.0000 mg | Freq: Once | INTRAMUSCULAR | Status: AC
  Administered 2017-11-16: 19:00:00 1 mg via INTRAVENOUS
  Filled 2017-11-16: qty 1

## 2017-11-16 MED ORDER — FLUTICASONE PROPIONATE 50 MCG/ACT NA SUSP
2.0000 | Freq: Every day | NASAL | Status: DC
  Administered 2017-11-17 – 2017-11-18 (×2): 2 via NASAL
  Filled 2017-11-16: qty 16

## 2017-11-16 MED ORDER — SODIUM CHLORIDE 0.9 % IV SOLN
100.0000 mg | Freq: Two times a day (BID) | INTRAVENOUS | Status: DC
  Administered 2017-11-16 – 2017-11-17 (×2): 100 mg via INTRAVENOUS
  Filled 2017-11-16 (×3): qty 100

## 2017-11-16 MED ORDER — ASPIRIN 300 MG RE SUPP
300.0000 mg | Freq: Every day | RECTAL | Status: DC
  Administered 2017-11-16 – 2017-11-17 (×2): 300 mg via RECTAL
  Filled 2017-11-16 (×2): qty 1

## 2017-11-16 MED ORDER — ACETAMINOPHEN 650 MG RE SUPP: 650.0000 mg | Freq: Four times a day (QID) | RECTAL | Status: DC | PRN

## 2017-11-16 MED ORDER — ONDANSETRON HCL 4 MG PO TABS: 4.0000 mg | ORAL_TABLET | Freq: Four times a day (QID) | ORAL | Status: DC | PRN

## 2017-11-16 MED ORDER — METOPROLOL TARTRATE 25 MG PO TABS
25.0000 mg | ORAL_TABLET | Freq: Two times a day (BID) | ORAL | Status: DC
  Administered 2017-11-16 – 2017-11-18 (×4): 25 mg via ORAL
  Filled 2017-11-16 (×4): qty 1

## 2017-11-16 MED ORDER — ORAL CARE MOUTH RINSE
15.0000 mL | Freq: Two times a day (BID) | OROMUCOSAL | Status: DC
  Administered 2017-11-16 – 2017-11-18 (×5): 15 mL via OROMUCOSAL

## 2017-11-16 MED ORDER — SODIUM CHLORIDE 0.9 % IV SOLN
INTRAVENOUS | Status: DC
  Administered 2017-11-16 – 2017-11-17 (×2): via INTRAVENOUS

## 2017-11-16 MED ORDER — HEPARIN SODIUM (PORCINE) 5000 UNIT/ML IJ SOLN
5000.0000 [IU] | Freq: Three times a day (TID) | INTRAMUSCULAR | Status: DC
  Administered 2017-11-16 – 2017-11-17 (×5): 5000 [IU] via SUBCUTANEOUS
  Filled 2017-11-16 (×7): qty 1

## 2017-11-16 MED ORDER — DOXYCYCLINE HYCLATE 100 MG PO TABS
100.0000 mg | ORAL_TABLET | Freq: Two times a day (BID) | ORAL | Status: DC
  Filled 2017-11-16: qty 1

## 2017-11-16 MED ORDER — ROPINIROLE HCL 1 MG PO TABS
2.0000 mg | ORAL_TABLET | Freq: Every day | ORAL | Status: DC
  Administered 2017-11-16 – 2017-11-17 (×2): 2 mg via ORAL
  Filled 2017-11-16 (×2): qty 2

## 2017-11-16 MED ORDER — ONDANSETRON HCL 4 MG/2ML IJ SOLN: 4.0000 mg | Freq: Four times a day (QID) | INTRAMUSCULAR | Status: DC | PRN

## 2017-11-16 MED ORDER — PANTOPRAZOLE SODIUM 40 MG PO TBEC
40.0000 mg | DELAYED_RELEASE_TABLET | Freq: Every day | ORAL | Status: DC
  Administered 2017-11-17 – 2017-11-18 (×2): 40 mg via ORAL
  Filled 2017-11-16 (×2): qty 1

## 2017-11-16 MED ORDER — QUETIAPINE FUMARATE 200 MG PO TABS
400.0000 mg | ORAL_TABLET | Freq: Every day | ORAL | Status: DC
  Administered 2017-11-16 – 2017-11-17 (×2): 400 mg via ORAL
  Filled 2017-11-16 (×3): qty 2

## 2017-11-16 MED ORDER — BISACODYL 10 MG RE SUPP
10.0000 mg | Freq: Every day | RECTAL | Status: DC | PRN
  Filled 2017-11-16: qty 1

## 2017-11-16 MED ORDER — DOCUSATE SODIUM 100 MG PO CAPS
100.0000 mg | ORAL_CAPSULE | Freq: Two times a day (BID) | ORAL | Status: DC
  Administered 2017-11-17 – 2017-11-18 (×2): 100 mg via ORAL
  Filled 2017-11-16 (×3): qty 1

## 2017-11-16 MED ORDER — GABAPENTIN 100 MG PO CAPS
100.0000 mg | ORAL_CAPSULE | Freq: Three times a day (TID) | ORAL | Status: DC
  Administered 2017-11-16: 21:00:00 100 mg via ORAL
  Filled 2017-11-16: qty 1

## 2017-11-16 MED ORDER — NICOTINE 21 MG/24HR TD PT24
21.0000 mg | MEDICATED_PATCH | Freq: Every day | TRANSDERMAL | Status: DC
  Administered 2017-11-16 – 2017-11-18 (×3): 21 mg via TRANSDERMAL
  Filled 2017-11-16 (×3): qty 1

## 2017-11-16 MED ORDER — CLOPIDOGREL BISULFATE 75 MG PO TABS: 75.0000 mg | ORAL_TABLET | Freq: Every day | ORAL | Status: DC

## 2017-11-16 NOTE — H&P (Signed)
History and Physical    Paula Frank WCH:[id number] DOB: [birth year] DOA: 11/16/2017  Referring physician: Dr. Burlene Arnt PCP: Olin Hauser, DO  Specialists: none  Chief Complaint: "my tongue isn't working right"  HPI: Paula Frank is a 77 y.o. female has a past medical history significant for seizures, HTN, and bipolar disorder who presents to ER with slurred speech and expressive aphasia. BP elevated. Labs and head CT non-diagnostic. Sx's are improving but still present. She is now admitted. No fever. Denies CP or SOB. No N/V/D. Denies HA or dizziness. No focal numbness or weakness. No seizure activity noted.  Review of Systems: The patient denies anorexia, fever, weight loss,, vision loss, decreased hearing, hoarseness, chest pain, syncope, dyspnea on exertion, peripheral edema, balance deficits, hemoptysis, abdominal pain, melena, hematochezia, severe indigestion/heartburn, hematuria, incontinence, genital sores, muscle weakness, suspicious skin lesions, transient blindness, difficulty walking, depression, unusual weight change, abnormal bleeding, enlarged lymph nodes, angioedema, and breast masses.   Past Medical History:  Diagnosis Date  . Anxiety   . Chronic kidney disease   . Chronic pain   . Discoid lupus   . Diverticulitis   . Gastritis   . Lupus (systemic lupus erythematosus) ([place])   . Restless leg   . Seizures ([place])    Past Surgical History:  Procedure Laterality Date  . ABDOMINAL HYSTERECTOMY    . APPENDECTOMY    . KNEE ARTHROSCOPY W/ MENISCECTOMY    . LAPAROSCOPY    . LITHOTRIPSY    . ROTATOR CUFF REPAIR    . ROTATOR CUFF REPAIR Bilateral    x2  . TUBAL LIGATION     Social History:  reports that she has been smoking cigarettes.  She has a 60.00 pack-year smoking history. She has never used smokeless tobacco. She reports that she drinks about 4.2 - 8.4 oz of alcohol per week. She reports that she does not use drugs.  Allergies  Allergen  Reactions  . Iodinated Diagnostic Agents     Other reaction(s): Unknown Other reaction(s): Other (See Comments) Other reaction(s): Unknown  . Sulfa Antibiotics Rash    Other reaction(s): Unknown Other reaction(s): Unknown   . Amoxicillin Other (See Comments)  . Ioxaglate     Other reaction(s): Unknown Other reaction(s): Unknown Other reaction(s): Unknown   . Levaquin [Levofloxacin In D5w]   . Moxifloxacin Other (See Comments)    Other reaction(s): Unknown Other reaction(s): Unknown  . Moxifloxacin Hcl In Nacl     Other reaction(s): Unknown  . Penicillin G     Other reaction(s): Other (See Comments) Other reaction(s): Unknown  . Erythromycin Rash    Canker sore  . Levofloxacin Rash    Other reaction(s): Unknown Other reaction(s): Unknown  . Sulfacetamide Sodium Rash    Other reaction(s): Unknown    Family History  Problem Relation Age of Onset  . Cancer Mother   . Esophageal cancer Father   . Cancer Son   . Breast cancer Sister     Prior to Admission medications   Medication Sig Start Date End Date Taking? Authorizing Provider  buPROPion (WELLBUTRIN XL) 150 MG 24 hr tablet Take 1 tablet (150 mg total) by mouth daily. Patient not taking: Reported on 08/13/2017 10/31/15   Clapacs, Madie Reno, MD  divalproex (DEPAKOTE) 250 MG DR tablet Take 1 tablet (250 mg total) by mouth every 8 (eight) hours. Patient not taking: Reported on 08/13/2017 10/31/15   Clapacs, Madie Reno, MD  fluocinonide ointment (LIDEX) 3.53 % Apply 2 application topically  2 (two) times daily. 03/14/16   [provider]  fluticasone (FLONASE) 50 MCG/ACT nasal spray Place 2 sprays into both nostrils daily. Use for 4-6 weeks then stop and use seasonally or as needed. 10/09/16   Karamalegos, Devonne Doughty, DO  halobetasol (ULTRAVATE) 0.05 % ointment Apply 1 application topically 2 (two) times daily. 06/06/15   [provider]  lamoTRIgine (LAMICTAL) 25 MG tablet Wk1 1 pm wk2 1 2xday wk3 1 am 2 pm wk4 2 2xday  wk5 2 am 3 pm wk6 3 2x day wk7 3 am 4 pm wk8 4 2xday wk9 4 am 5 pm wk 10 5 2xday 09/05/16   [provider]  loratadine (CLARITIN) 10 MG tablet Take 1 tablet (10 mg total) by mouth daily. 07/23/16   Arlis Porta., MD  montelukast (SINGULAIR) 10 MG tablet TAKE 1 TABLET (10 MG TOTAL) BY MOUTH DAILY. 01/19/16   Arlis Porta., MD  ondansetron (ZOFRAN) 4 MG tablet Take 1 tablet (4 mg total) by mouth every 8 (eight) hours as needed for nausea or vomiting. 07/31/17   Karamalegos, Devonne Doughty, DO  ondansetron (ZOFRAN) 4 MG tablet Take 1 tablet (4 mg total) by mouth daily as needed for nausea or vomiting. 09/25/17 09/25/18  Merlyn Lot, MD  QUEtiapine (SEROQUEL) 400 MG tablet Take 400 mg by mouth at bedtime.     [provider]  ranitidine (ZANTAC) 150 MG tablet Take by mouth.    [provider]  rOPINIRole (REQUIP) 1 MG tablet TAKE 1 TABLET EVERY MORNING AND 2 TABLETS EVERY NIGHT 07/31/17   Karamalegos, Devonne Doughty, DO  triamcinolone ointment (KENALOG) 0.5 % Apply 1 application topically 2 (two) times daily. 07/17/16   Nance Pear, MD   Physical Exam: Vitals:   11/16/17 1435 11/16/17 1500  BP: (!) 157/99 (!) 148/90  Pulse: 98 94  Resp: 18 (!) 21  Temp: 97.7 F (36.5 C)   TempSrc: Oral   SpO2: 100% 97%  Weight: 61.2 kg (135 lb)   Height: 5\' 6"  (1.676 m)      General:  No apparent distress, WDWN, [place]/AT  Eyes: PERRL, EOMI, no scleral icterus, conjunctiva clear  ENT: moist oropharynx without exudate, TM's benign, dentition fair  Neck: supple, no lymphadenopathy. Bilateral bruits. No thyromegaly  Cardiovascular: regular rate without MRG; 2+ peripheral pulses, no JVD, no peripheral edema  Respiratory: CTA biL, good air movement without wheezing, rhonchi or crackled. Respiratory effort normal  Abdomen: soft, non tender to palpation, positive bowel sounds, no guarding, no rebound  Skin: no rashes or lesions  Musculoskeletal: normal bulk and tone, no  joint swelling  Psychiatric: normal mood and affect, A&OX3  Neurologic: CN 2-12 grossly intact, Motor strength 5/5 in all 4 groups with symmetric DTR's and non-focal sensory exam  Labs on Admission:  Basic Metabolic Panel: Recent Labs  Lab 11/16/17 1446  NA 136  K 4.1  CL 104  CO2 29  GLUCOSE 87  BUN 20  CREATININE 1.13*  CALCIUM 9.5   Liver Function Tests: Recent Labs  Lab 11/16/17 1446  AST 24  ALT 18  ALKPHOS 99  BILITOT 0.2*  PROT 6.8  ALBUMIN 3.3*   No results for input(s): LIPASE, AMYLASE in the last 168 hours. No results for input(s): AMMONIA in the last 168 hours. CBC: Recent Labs  Lab 11/16/17 1446  WBC 8.1  NEUTROABS 5.9  HGB 11.9*  HCT 34.4*  MCV 103.3*  PLT 420   Cardiac Enzymes: Recent Labs  Lab 11/16/17 1446  TROPONINI <0.03    BNP (last 3 results) No results for input(s): BNP in the last 8760 hours.  ProBNP (last 3 results) No results for input(s): PROBNP in the last 8760 hours.  CBG: Recent Labs  Lab 11/16/17 1443  GLUCAP 70    Radiological Exams on Admission: Ct Head Wo Contrast  Result Date: 11/16/2017 CLINICAL DATA:  Right facial droop and slurred speech. EXAM: CT HEAD WITHOUT CONTRAST TECHNIQUE: Contiguous axial images were obtained from the base of the skull through the vertex without intravenous contrast. COMPARISON:  10/28/2015. FINDINGS: Brain: Diffusely enlarged ventricles and subarachnoid spaces. Patchy white matter low density in both cerebral hemispheres. No intracranial hemorrhage, mass lesion or CT evidence of acute infarction. Vascular: No hyperdense vessel or unexpected calcification. Skull: Normal. Negative for fracture or focal lesion. Sinuses/Orbits: Status post bilateral cataract extraction. Extensive left sphenoid and right maxillary sinus mucosal thickening with fluid or retained secretions in the right maxillary sinus. Other: None. IMPRESSION: 1. No acute intracranial abnormality. 2. Stable atrophy and chronic  small vessel white matter ischemic changes. 3. 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 Electronically Signed   By: Claudie Revering M.D.   On: 11/16/2017 15:20    EKG: Independently reviewed.  Assessment/Plan Principal Problem:   TIA (transient ischemic attack) Active Problems:   Bipolar I disorder, most recent episode depressed, severe without psychotic features ([place])   Essential hypertension   Seizure ([place])   Will observe on floor with telemetry. MRI of brain, echo and carotid US ordered. Neuro checks q4h. Consult Neurology. Begin empiric Plavix. Repeat labs in AM.  Diet: low salt Fluids: NS@75  DVT Prophylaxis: SQ Heparin  Code Status: FULL  Family Communication: none  Disposition Plan: home  Time spent: 50 min

## 2017-11-16 NOTE — ED Notes (Signed)
Pt taken to US at this time

## 2017-11-16 NOTE — Progress Notes (Signed)
Pt reports pain of 6/10 to left shoulder.  Had a rotator cuff repair and states was pulled on that arm by EMS.  Pt NPO.  Spoke with Dr Jerelyn Charles and received order for 1 mg MS IV q 2 prn. Dorna Bloom RN

## 2017-11-16 NOTE — ED Provider Notes (Signed)
[facility] Emergency Department Provider Note  ____________________________________________   I have reviewed the triage vital signs and the nursing notes. Where available I have reviewed prior notes and, if possible and indicated, outside hospital notes.    HISTORY  Chief Complaint Aphasia    HPI Paula Frank is a 77 y.o. female history of chronic kidney disease, anxiety chronic pain, diverticulitis, EtOH abuse, seizures, lupus, poor disorder, presents today complaining of expressive aphasia.  She states she just cannot get her words out.  This began this morning when she woke up at least this is when she noticed it.  She states that she has not had any focal numbness or weakness she denies any fall, she did drink alcohol last night she states she has 2 glasses of wine a day. She denies any other ingestion.  She states that she thinks she felt well yesterday to the extent that she can remember.  She lives by herself.   Past Medical History:  Diagnosis Date  . Anxiety   . Chronic kidney disease   . Chronic pain   . Discoid lupus   . Diverticulitis   . Gastritis   . Lupus (systemic lupus erythematosus) ([place])   . Restless leg   . Seizures [facility])     Patient Active Problem List   Diagnosis Date Noted  . Substance induced mood disorder ([place]) 11/05/2017  . Constipation, chronic 07/11/2017  . COPD (chronic obstructive pulmonary disease) ([place]) 11/21/2016  . Appetite loss 10/09/2016  . Dermatitis 07/23/2016  . Nausea & vomiting 04/09/2016  . Herpes labialis 02/23/2016  . Abdominal swelling, generalized 01/26/2016  . Bilateral arm weakness 10/12/2015  . Difficulty walking 10/12/2015  . Neck pain 10/12/2015  . Rash 10/12/2015  . Spell of altered consciousness 10/12/2015  . 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 Bipolar I disorder, most recent episode depressed, severe without psychotic features ([place]) 02/16/2015  . Alcohol abuse 02/16/2015  . Alzheimer's  dementia 02/16/2015  . 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 Current tobacco use 02/19/2014  . Systemic lupus erythematosus ([place]) 01/14/2014  . Seizure ([place]) 10/30/2013  . Disordered sleep 10/30/2013  . Adynamia 10/30/2013  . Essential hypertension 05/19/2012  . Right shoulder injury 05/16/2012  . Fatigue 11/20/2011    Past Surgical History:  Procedure Laterality Date  . ABDOMINAL HYSTERECTOMY    . APPENDECTOMY    . KNEE ARTHROSCOPY W/ MENISCECTOMY    . LAPAROSCOPY    . LITHOTRIPSY    . ROTATOR CUFF REPAIR    . ROTATOR CUFF REPAIR Bilateral    x2  . TUBAL LIGATION      Prior to Admission medications   Medication Sig Start Date End Date Taking? Authorizing Provider  buPROPion (WELLBUTRIN XL) 150 MG 24 hr tablet Take 1 tablet (150 mg total) by mouth daily. Patient not taking: Reported on 08/13/2017 10/31/15   Clapacs, Madie Reno, MD  divalproex (DEPAKOTE) 250 MG DR tablet Take 1 tablet (250 mg total) by mouth every 8 (eight) hours. Patient not taking: Reported on 08/13/2017 10/31/15   Clapacs, Madie Reno, MD  fluocinonide ointment (LIDEX) 8.75 % Apply 2 application topically 2 (two) times daily. 03/14/16   [provider]  fluticasone (FLONASE) 50 MCG/ACT nasal spray Place 2 sprays into both nostrils daily. Use for 4-6 weeks then stop and use seasonally or as needed. 10/09/16   Olin Hauser, DO  halobetasol (ULTRAVATE) 0.05 % ointment Apply 1 application topically 2 (two) times daily. 06/06/15   [provider]  lamoTRIgine (LAMICTAL) 25 MG tablet Wk1 1 pm wk2 1 2xday wk3 1 am 2 pm wk4 2 2xday wk5 2 am 3 pm wk6 3 2x day wk7 3 am 4 pm wk8 4 2xday wk9 4 am 5 pm wk 10 5 2xday 09/05/16   [provider]  loratadine (CLARITIN) 10 MG tablet Take 1 tablet (10 mg total) by mouth daily. 07/23/16   Arlis Porta., MD  montelukast (SINGULAIR)  10 MG tablet TAKE 1 TABLET (10 MG TOTAL) BY MOUTH DAILY. 01/19/16   Arlis Porta., MD  ondansetron (ZOFRAN) 4 MG tablet Take 1 tablet (4 mg total) by mouth every 8 (eight) hours as needed for nausea or vomiting. 07/31/17   Karamalegos, Devonne Doughty, DO  ondansetron (ZOFRAN) 4 MG tablet Take 1 tablet (4 mg total) by mouth daily as needed for nausea or vomiting. 09/25/17 09/25/18  Merlyn Lot, MD  QUEtiapine (SEROQUEL) 400 MG tablet Take 400 mg by mouth at bedtime.     [provider]  ranitidine (ZANTAC) 150 MG tablet Take by mouth.    [provider]  rOPINIRole (REQUIP) 1 MG tablet TAKE 1 TABLET EVERY MORNING AND 2 TABLETS EVERY NIGHT 07/31/17   Karamalegos, Devonne Doughty, DO  triamcinolone ointment (KENALOG) 0.5 % Apply 1 application topically 2 (two) times daily. 07/17/16   Nance Pear, MD    Allergies Iodinated diagnostic agents; Sulfa antibiotics; Amoxicillin; Ioxaglate; Levaquin [levofloxacin in d5w]; Moxifloxacin; Moxifloxacin hcl in nacl; Penicillin g; Erythromycin; Levofloxacin; and Sulfacetamide sodium  Family History  Problem Relation Age of Onset  . Cancer Mother   . Esophageal cancer Father   . Cancer Son   . Breast cancer Sister     Social History Social History   Tobacco Use  . Smoking status: Current Every Day Smoker    Packs/day: 1.00    Years: 60.00    Pack years: 60.00    Types: Cigarettes  . Smokeless tobacco: Never Used  Substance Use Topics  . Alcohol use: Yes    Alcohol/week: 4.2 - 8.4 oz    Types: 7 - 14 Standard drinks or equivalent per week    Comment: 2 glasses of wine a day  . Drug use: No    Review of Systems Constitutional: No fever/chills Eyes: No visual changes. ENT: No sore throat. No stiff neck no neck pain Cardiovascular: Denies chest pain. Respiratory: Denies shortness of breath. Gastrointestinal:   no vomiting.  No diarrhea.  No constipation. Genitourinary: Negative for dysuria. Musculoskeletal: Negative lower  extremity swelling Skin: Negative for rash. Neurological: Negative for severe headaches, focal weakness or numbness.   ____________________________________________   PHYSICAL EXAM:  VITAL SIGNS: ED Triage Vitals [11/16/17 1435]  Enc Vitals Group     BP (!) 157/99     Pulse Rate 98     Resp 18     Temp 97.7 F (36.5 C)     Temp Source Oral     SpO2 100 %     Weight 135 lb (61.2 kg)     Height 5\' 6"  (1.676 m)     Head Circumference      Peak Flow      Pain Score 0     Pain Loc      Pain Edu?      Excl. in [place]?     Constitutional: Alert and oriented.  Patient does not look like she is in baseline good health but she is in no acute medical distress at this time eyes: Conjunctivae are normal Head: Atraumatic HEENT: No congestion/rhinnorhea. Mucous membranes are moist.  Oropharynx non-erythematous Neck:   Nontender with no meningismus, no masses, no stridor Cardiovascular: Normal rate, regular rhythm. Grossly normal heart sounds.  Good peripheral circulation. Respiratory: Normal respiratory effort.  No retractions. Lungs CTAB. Abdominal: Soft and nontender. No distention. No guarding no rebound Back:  There is no focal tenderness or step off.  there is no midline tenderness there are no lesions noted. there is no CVA tenderness Musculoskeletal: No lower extremity tenderness, no upper extremity tenderness. No joint effusions, no DVT signs strong distal pulses no edema Neurologic: Patient has difficulty finding and expressing words, she has dysarthria and some expressive aphasia it appears however she otherwise appears to be neurologically intact.  She has a baseline tremor which makes finger-to-nose somewhat difficult calculi.  No obvious visual field deficits noted.  She has intact strength in bilateral upper and lower extremities, intact sensation, triage nurse thought she saw facial droop I do not see one.  She knows the date and where she is, can identify my watch. Skin:  Skin is  warm, dry and intact. No rash noted. Psychiatric: Mood and affect are normal. Speech and behavior are normal.  ____________________________________________   LABS (all labs ordered are listed, but only abnormal results are displayed)  Labs Reviewed  CBC - Abnormal; Notable for the following components:      Result Value   RBC 3.33 (*)    Hemoglobin 11.9 (*)    HCT 34.4 (*)    MCV 103.3 (*)    MCH 35.7 (*)    RDW 15.0 (*)    All other components within normal limits  COMPREHENSIVE METABOLIC PANEL - Abnormal; Notable for the following components:   Creatinine, Ser 1.13 (*)    Albumin 3.3 (*)    Total Bilirubin 0.2 (*)    GFR calc non Af Amer 46 (*)    GFR calc Af Amer 53 (*)    Anion gap 3 (*)    All other components within normal limits  GLUCOSE, CAPILLARY  PROTIME-INR  APTT  DIFFERENTIAL  TROPONIN I  ETHANOL  CBG MONITORING, ED    Pertinent labs  results that were available during my care of the patient were reviewed by me and considered in my medical decision making (see chart for details). ____________________________________________  EKG  I personally interpreted any EKGs ordered by me or triage Sinus tachycardia, rate 101, normal axis no acute ST elevation or depression ____________________________________________  RADIOLOGY  Pertinent labs & imaging results that were available during my care of the patient were reviewed by me and considered in my medical decision making (see chart for details). If possible, patient and/or family made aware of any abnormal findings.  Ct Head Wo Contrast  Result Date: 11/16/2017 CLINICAL DATA:  Right facial droop and slurred speech. EXAM: CT HEAD WITHOUT CONTRAST TECHNIQUE: Contiguous axial images were obtained from the base of the skull through the vertex without intravenous contrast. COMPARISON:  10/28/2015. FINDINGS: Brain: Diffusely enlarged ventricles and subarachnoid spaces. Patchy white matter low density in both cerebral  hemispheres. No intracranial hemorrhage, mass lesion or CT evidence of acute infarction. Vascular: No hyperdense vessel or unexpected calcification. Skull: Normal. Negative for fracture or focal lesion. Sinuses/Orbits: Status post bilateral cataract extraction. Extensive left sphenoid and right maxillary sinus mucosal thickening with fluid  or retained secretions in the right maxillary sinus. Other: None. IMPRESSION: 1. No acute intracranial abnormality. 2. Stable atrophy and chronic small vessel white matter ischemic changes. 3. 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 Electronically Signed   By: Claudie Revering M.D.   On: 11/16/2017 15:20   ____________________________________________    PROCEDURES  Procedure(s) performed: None  Procedures  Critical Care performed: None  ____________________________________________   INITIAL IMPRESSION / ASSESSMENT AND PLAN / ED COURSE  Pertinent labs & imaging results that were available during my care of the patient were reviewed by me and considered in my medical decision making (see chart for details).  Patient here with what she describes as expressive aphasia have never seen her before and cannot attest to whether this is new or old however according to her, it began this morning.  Patient does smoke and drink and is 77 year old's old.  Has significant risk factors therefore for CVA.  Patient's symptoms had onset however this morning after going to bed so she is certainly not a candidate for TPA at this time, last known well was sometime last night and she is not exactly sure when that was it could have been earlier in the day.  Given her findings we are admitting her to the hospitalist service,    ____________________________________________   FINAL CLINICAL IMPRESSION(S) / ED DIAGNOSES  Final diagnoses:  Expressive aphasia      This chart was dictated using  voice recognition software.  Despite best efforts to proofread,  errors can occur which can change meaning.      Schuyler Amor, MD 11/16/17 [phone]

## 2017-11-16 NOTE — ED Triage Notes (Addendum)
Pt presents to ED via [facility] with c/o R facial droop and slurred speech. Pt states noted slurred speech this morning when she woke up this morning. Pt states last known well 11/15/2017 at 11pm, states woke up at approx 0730. Pt with noted R facial droop and slurred speech. EMS also reports pt recently started on Gabapentin.

## 2017-11-16 NOTE — ED Notes (Signed)
This RN spoke with MD regarding floor RN concern for pt being NPO with exception of sips for meds despite failing stroke swallow screen. MD acknowledged concerns. No change in orders.

## 2017-11-16 NOTE — ED Notes (Signed)
Dr. Doy Hutching made aware that patient failed stroke swallow screen due to water drooling out of R side of mouth when given water from cup.

## 2017-11-16 NOTE — Progress Notes (Signed)
Pt has labile moods varying from pleasant to agitating wanting her po meds. Constant movement of LE's. NIH-2. IVF's infusing with doxcycline IV infusing. Ativan 1 mg IV given as premed for MRI. Reports positive ETOH use daily- will monitor. Abdomen distended. Skin rash/lesion/dermatitis like appearance skin issues on trunk, arms/leg and feet. Talkative with flighty speech/ideas.  Valuables locked up in hospital safe- claim ticket in pt's personal belongings bag.

## 2017-11-16 NOTE — ED Notes (Signed)
Admitting MD at bedside at this time.

## 2017-11-17 ENCOUNTER — Observation Stay
Admit: 2017-11-17 | Discharge: 2017-11-17 | Disposition: A | Discharge status: Home / Self Care | Payer: Medicare Other | Attending: Internal Medicine | Admitting: Internal Medicine

## 2017-11-17 DIAGNOSIS — G459 Transient cerebral ischemic attack, unspecified: Secondary | ICD-10-CM | POA: Diagnosis not present

## 2017-11-17 DIAGNOSIS — G40909 Epilepsy, unspecified, not intractable, without status epilepticus: Secondary | ICD-10-CM | POA: Diagnosis not present

## 2017-11-17 DIAGNOSIS — I10 Essential (primary) hypertension: Secondary | ICD-10-CM | POA: Diagnosis not present

## 2017-11-17 DIAGNOSIS — R4781 Slurred speech: Secondary | ICD-10-CM | POA: Diagnosis not present

## 2017-11-17 DIAGNOSIS — I6389 Other cerebral infarction: Secondary | ICD-10-CM | POA: Diagnosis not present

## 2017-11-17 DIAGNOSIS — F319 Bipolar disorder, unspecified: Secondary | ICD-10-CM | POA: Diagnosis not present

## 2017-11-17 LAB — GLUCOSE, CAPILLARY: GLUCOSE-CAPILLARY: 68 mg/dL (ref 65–99)

## 2017-11-17 LAB — COMPREHENSIVE METABOLIC PANEL
ALT: 14 U/L (ref 14–54)
ANION GAP: 4 — AB (ref 5–15)
AST: 19 U/L (ref 15–41)
Albumin: 2.9 g/dL — ABNORMAL LOW (ref 3.5–5.0)
Alkaline Phosphatase: 90 U/L (ref 38–126)
BUN: 19 mg/dL (ref 6–20)
CALCIUM: 8.8 mg/dL — AB (ref 8.9–10.3)
CO2: 27 mmol/L (ref 22–32)
CREATININE: 0.79 mg/dL (ref 0.44–1.00)
Chloride: 109 mmol/L (ref 101–111)
Glucose, Bld: 80 mg/dL (ref 65–99)
Potassium: 4.2 mmol/L (ref 3.5–5.1)
Sodium: 140 mmol/L (ref 135–145)
Total Bilirubin: 0.3 mg/dL (ref 0.3–1.2)
Total Protein: 6.3 g/dL — ABNORMAL LOW (ref 6.5–8.1)

## 2017-11-17 LAB — CBC
HCT: 34.3 % — ABNORMAL LOW (ref 35.0–47.0)
HEMOGLOBIN: 11.6 g/dL — AB (ref 12.0–16.0)
MCH: 35.1 pg — ABNORMAL HIGH (ref 26.0–34.0)
MCHC: 33.7 g/dL (ref 32.0–36.0)
MCV: 104.3 fL — ABNORMAL HIGH (ref 80.0–100.0)
PLATELETS: 364 10*3/uL (ref 150–440)
RBC: 3.29 MIL/uL — AB (ref 3.80–5.20)
RDW: 15 % — ABNORMAL HIGH (ref 11.5–14.5)
WBC: 5.7 10*3/uL (ref 3.6–11.0)

## 2017-11-17 MED ORDER — DOXYCYCLINE HYCLATE 100 MG PO TABS
100.0000 mg | ORAL_TABLET | Freq: Two times a day (BID) | ORAL | Status: DC
  Filled 2017-11-17 (×2): qty 1

## 2017-11-17 MED ORDER — DIVALPROEX SODIUM 125 MG PO CSDR
250.0000 mg | DELAYED_RELEASE_CAPSULE | Freq: Two times a day (BID) | ORAL | Status: DC
  Administered 2017-11-17 – 2017-11-18 (×3): 250 mg via ORAL
  Filled 2017-11-17 (×4): qty 2

## 2017-11-17 MED ORDER — DOXYCYCLINE HYCLATE 100 MG PO TABS: 100.0000 mg | ORAL_TABLET | Freq: Two times a day (BID) | ORAL | Status: DC

## 2017-11-17 MED ORDER — DOXYCYCLINE HYCLATE 100 MG PO TABS
100.0000 mg | ORAL_TABLET | Freq: Two times a day (BID) | ORAL | Status: DC
  Administered 2017-11-17 – 2017-11-18 (×2): 100 mg via ORAL
  Filled 2017-11-17 (×3): qty 1

## 2017-11-17 MED ORDER — OXYCODONE-ACETAMINOPHEN 5-325 MG PO TABS
1.0000 | ORAL_TABLET | Freq: Four times a day (QID) | ORAL | Status: DC | PRN
  Administered 2017-11-17 – 2017-11-18 (×2): 1 via ORAL
  Filled 2017-11-17 (×3): qty 1

## 2017-11-17 MED ORDER — ASPIRIN EC 81 MG PO TBEC
81.0000 mg | DELAYED_RELEASE_TABLET | Freq: Every day | ORAL | Status: DC
  Administered 2017-11-18: 09:00:00 81 mg via ORAL
  Filled 2017-11-17: qty 1

## 2017-11-17 NOTE — Progress Notes (Signed)
Stroke ruled out. ST consult done with diet ordered with po meds given. Orders reviewed and updated. ECHO done per MD request. Pt has very labile movements from pleasant/smiling to crying/depressed- depakote started. Emotional support given.

## 2017-11-17 NOTE — Progress Notes (Signed)
*  PRELIMINARY RESULTS* Echocardiogram 2D Echocardiogram has been performed.  Paula Frank 11/17/2017, 12:20 PM

## 2017-11-17 NOTE — Progress Notes (Signed)
Neurology:  77 y.o. female has a past medical history significant for seizures, HTN, and bipolar disorder who presents to ER with slurred speech and expressive aphasia. Significantly improved and hard to state if she is back to baseline. No acute abnormalities on MRI    Neurological Examination   Mental Status: Alert, oriented to name and location . Cranial Nerves: II: Discs flat bilaterally; Visual fields grossly normal, pupils equal, round, reactive to light and accommodation III,IV, VI: ptosis not present, extra-ocular motions intact bilaterally V,VII: smile symmetric, facial light touch sensation normal bilaterally VIII: hearing normal bilaterally IX,X: gag reflex present XI: bilateral shoulder shrug XII: midline tongue extension Motor: Right : Upper extremity   4/5    Left:     Upper extremity   3/5  Lower extremity   4/5     Lower extremity   3/5 Tone and bulk:normal tone throughout; no atrophy noted Sensory: Pinprick and light touch intact throughout, bilaterally Deep Tendon Reflexes: 2+ and symmetric throughout Plantars: Right: downgoing   Left: downgoing Cerebellar: Not tested Gait: not tested  -  LUE weaker due to chronic L shoulder cuff repair - pts speech fluctuates. She appears to be very tearful and possibly depressed which is likely a component of current situation  - would consider re checking UA as had UTI earlier this month on 5/13 - speech therapy - pt/ot - possibly SNIFF

## 2017-11-17 NOTE — Care Management Obs Status (Signed)
[facility] NOTIFICATION   Patient Details  Name: LYNNIAH JANOSKI MRN: [id number] Date of Birth: 24-Oct-1940   Medicare Observation Status Notification Given:  Yes    Jolly Mango, RN 11/17/2017, 11:04 AM
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
## 2017-11-17 NOTE — Progress Notes (Signed)
On rounds with nurse Opal Sidles, pt demanded that bed alarm be turned off.  Explained to pt that it is for her safety and part of the reason she is in the hospital is for [place] to help her, monitor her and keep her safe.  Pt still demanded that bed alarm be turned off. Offered to walk pt around the unit or help her up to chair but pt refused.  Provided education and instruction to pt and she verbalized understanding that I would not be responsible for her falling if she refused the bed alarm.  Dorna Bloom RN

## 2017-11-17 NOTE — Evaluation (Signed)
Physical Therapy Evaluation Patient Details Name: Paula Frank MRN: [id number] DOB: 08-26-1940 Today's Date: 11/17/2017   History of Present Illness  Pt is a 77 y.o. female presenting to hospital with facial droop and slurred speech.  Pt admitted with expressive aphasia.  Imaging negative for acute stroke.  Pt noted to have recently been started on Neurontin.  Pt noted in chart to be labile (in terms of behavior).  PMH includes RLS, COPD, seizures, CKD, anxiety, chronic pain, EtOH abuse, seizures, lupus.  Clinical Impression  Prior to hospital admission, pt was ambulatory (typically did not use AD for walking but kept RW next to bed and used it if she felt unsteady).  Pt lives at [facility] on 3rd floor with elevator access.  Currently pt is modified independent supine to sit; modified independent with transfers using RW; and CGA ambulating 150 feet with RW.  Distance limited d/t pt reporting feeling weak and unsteady.  No loss of balance noted with any functional mobility using RW, doffing/donning underwear in standing for toileting, or washing hands at sink.  Pt demonstrating labile behavior during session (upset with therapist when things were not done in the exact way she wanted them to be done and in the right order but at other times laughing and smiling talking with therapist).  Pt reports noted UE involuntary hand/wrist movements and LE involuntary movements observed during session (at rest) were caused by being nervous (pt reports she had this prior to admission) but go away when she was not nervous (nursing notified).  These movements did not occur during session when pt was performing activities.  Pt would benefit from skilled PT to address noted impairments and functional limitations (see below for any additional details).  Upon hospital discharge, recommend pt discharge back home with HHPT and use of RW    Follow Up Recommendations Home health PT    Equipment  Recommendations  Rolling walker with 5" wheels    Recommendations for Other Services       Precautions / Restrictions Precautions Precautions: Fall Precaution Comments: Aspiration Restrictions Weight Bearing Restrictions: No      Mobility  Bed Mobility Overal bed mobility: Modified Independent             General bed mobility comments: Supine to sit with HOB elevated without any noted difficulty.  Transfers Overall transfer level: Modified independent Equipment used: Rolling walker (2 wheeled)             General transfer comment: steady transfers with use of RW  Ambulation/Gait Ambulation/Gait assistance: Min guard Ambulation Distance (Feet): 150 Feet Assistive device: Rolling walker (2 wheeled) Gait Pattern/deviations: Step-through pattern Gait velocity: mildly decreased   General Gait Details: steady with RW  Stairs            Wheelchair Mobility    Modified Rankin (Stroke Patients Only)       Balance Overall balance assessment: Needs assistance Sitting-balance support: No upper extremity supported;Feet supported Sitting balance-Leahy Scale: Normal Sitting balance - Comments: steady sitting reaching outside BOS   Standing balance support: Single extremity supported Standing balance-Leahy Scale: Poor Standing balance comment: pt able to reach outside BOS with L UE with R UE on walker for support (no loss of balance)                             Pertinent Vitals/Pain Pain Assessment: No/denies pain  Vitals (HR and O2 on room  air) stable and WFL throughout treatment session.    Home Living Family/patient expects to be discharged to:: Other (Comment) Living Arrangements: Alone               Additional Comments: [employer] community.  Lives on 3rd floor with elevator access.    Prior Function Level of Independence: Needs assistance   Gait / Transfers Assistance Needed: Modified independent to independent with  ambulation (typically does not use AD to ambulate with but keeps RW by her bed so if she feels unsteady she uses RW).  ADL's / Homemaking Assistance Needed: Goes to dining hall for meals.  Comments: Pt reports no falls in past 6 months.     Hand Dominance        Extremity/Trunk Assessment   Upper Extremity Assessment Upper Extremity Assessment: RUE deficits/detail;LUE deficits/detail RUE Deficits / Details: requires assist of L UE to perform ROM with R UE shoulder flexion; elbow flexion/extension 4+/5; strong hand grip LUE Deficits / Details: strength and ROM WFL (shoulder flexion, elbow flexion/extension) strong hand grip    Lower Extremity Assessment Lower Extremity Assessment: (4+/5 B hip flexion, knee flexion/extension, and DF)    Cervical / Trunk Assessment Cervical / Trunk Assessment: Normal  Communication   Communication: Other (comment)(Slurred speech (able to understand 95% of the time))  Cognition Arousal/Alertness: Awake/alert Behavior During Therapy: (Labile) Overall Cognitive Status: Within Functional Limits for tasks assessed                                        General Comments General comments (skin integrity, edema, etc.): Pt resting in bed upon PT entry.  Nursing cleared pt for participation in physical therapy.  Pt agreeable to PT session.    Exercises     Assessment/Plan    PT Assessment Patient needs continued PT services  PT Problem List Decreased strength;Decreased balance;Decreased mobility       PT Treatment Interventions DME instruction;Gait training;Functional mobility training;Therapeutic activities;Therapeutic exercise;Balance training;Patient/family education    PT Goals (Current goals can be found in the Care Plan section)  Acute Rehab PT Goals Patient Stated Goal: to improve mobility PT Goal Formulation: With patient Time For Goal Achievement: 12/01/17 Potential to Achieve Goals: Good    Frequency Min 2X/week    Barriers to discharge        Co-evaluation               AM-PAC PT "6 Clicks" Daily Activity  Outcome Measure Difficulty turning over in bed (including adjusting bedclothes, sheets and blankets)?: A Little Difficulty moving from lying on back to sitting on the side of the bed? : A Little Difficulty sitting down on and standing up from a chair with arms (e.g., wheelchair, bedside commode, etc,.)?: A Little Help needed moving to and from a bed to chair (including a wheelchair)?: A Little Help needed walking in hospital room?: A Little Help needed climbing 3-5 steps with a railing? : A Little 6 Click Score: 18    End of Session Equipment Utilized During Treatment: Gait belt Activity Tolerance: Patient tolerated treatment well Patient left: in chair;with call bell/phone within reach;with chair alarm set Nurse Communication: Mobility status;Precautions PT Visit Diagnosis: Other abnormalities of gait and mobility (R26.89);Muscle weakness (generalized) (M62.81)    Time: 9242-6834 PT Time Calculation (min) (ACUTE ONLY): 35 min   Charges:   PT Evaluation $PT Eval Low Complexity: 1  Low PT Treatments $Therapeutic Activity: 8-22 mins   PT G CodesLeitha Bleak, PT 11/17/17, 2:33 PM [phone]

## 2017-11-17 NOTE — Progress Notes (Signed)
[place] [facility] [place] NAME: Paula Frank    MR#:  [id number]  DATE OF BIRTH:  08-15-1940  SUBJECTIVE: Patient admitted for slurred speech, found to have no stroke on MRI of the brain.  She told me that she is started on Neurontin 2 days ago.  Has no weakness in hands or legs but main complaint is slurred speech but according to her husband speech also is better than yesterday.  Patient is really depressed, crying saying that she lost her husband and also her son.  She lost her adolescent due to cancer and she keeps remembering those episodes and keeps crying.  Patient also smokes about 2 packs of cigarettes a day, occasional drinking.  CHIEF COMPLAINT:   Chief Complaint  Patient presents with  . Aphasia    REVIEW OF SYSTEMS:    Review of Systems  Constitutional: Negative for chills and fever.  HENT: Negative for hearing loss.   Eyes: Negative for blurred vision, double vision and photophobia.  Respiratory: Negative for cough, hemoptysis and shortness of breath.   Cardiovascular: Negative for palpitations, orthopnea and leg swelling.  Gastrointestinal: Negative for abdominal pain, diarrhea and vomiting.  Genitourinary: Negative for dysuria and urgency.  Musculoskeletal: Negative for myalgias and neck pain.  Skin: Negative for rash.  Neurological: Positive for speech change. Negative for dizziness, focal weakness, seizures, weakness and headaches.  Psychiatric/Behavioral: Positive for depression. Negative for memory loss. The patient is nervous/anxious. The patient does not have insomnia.     Nutrition:  Tolerating Diet: Tolerating PT:      DRUG ALLERGIES:   Allergies  Allergen Reactions  . Iodinated Diagnostic Agents     Other reaction(s): Unknown Other reaction(s): Other (See Comments) Other reaction(s): Unknown  . Sulfa Antibiotics Rash    Other reaction(s): Unknown Other reaction(s): Unknown   . Amoxicillin Other  (See Comments)  . Ioxaglate     Other reaction(s): Unknown Other reaction(s): Unknown Other reaction(s): Unknown   . Levaquin [Levofloxacin In D5w]   . Moxifloxacin Other (See Comments)    Other reaction(s): Unknown Other reaction(s): Unknown  . Moxifloxacin Hcl In Nacl     Other reaction(s): Unknown  . Penicillin G     Other reaction(s): Other (See Comments) Other reaction(s): Unknown  . Erythromycin Rash    Canker sore  . Levofloxacin Rash    Other reaction(s): Unknown Other reaction(s): Unknown  . Sulfacetamide Sodium Rash    Other reaction(s): Unknown    VITALS:  Blood pressure (!) 159/101, pulse 84, temperature 97.9 F (36.6 C), temperature source Oral, resp. rate 20, height 5\' 6"  (1.676 m), weight 63.5 kg (139 lb 15.9 oz), SpO2 94 %.  PHYSICAL EXAMINATION:   Physical Exam  Constitutional: She is oriented to person, place, and time. She appears well-developed and well-nourished.  HENT:  Head: Normocephalic and atraumatic.  Nose: Nose normal.  Mouth/Throat: Oropharynx is clear and moist.  Eyes: Pupils are equal, round, and reactive to light. Conjunctivae and EOM are normal.  Neck: Normal range of motion. Neck supple.  Cardiovascular: Normal rate, regular rhythm, normal heart sounds and intact distal pulses.  Pulmonary/Chest: Effort normal and breath sounds normal.  Abdominal: Soft. Bowel sounds are normal. She exhibits no distension. There is no tenderness.  Musculoskeletal: Normal range of motion. She exhibits no edema.  Patient has RLS.  Constantly moving the legs. no Tremors noted.  Neurological: She is alert and oriented to person, place, and time.  Noted to  have slurred speech.  Skin: Skin is warm and dry.  Psychiatric: Judgment normal.  Depressed, crying uncontrollably at times loss of her husband, her son.       LABORATORY PANEL:   CBC Recent Labs  Lab 11/17/17 0429  WBC 5.7  HGB 11.6*  HCT 34.3*  PLT 364    ------------------------------------------------------------------------------------------------------------------  Chemistries  Recent Labs  Lab 11/17/17 0429  NA 140  K 4.2  CL 109  CO2 27  GLUCOSE 80  BUN 19  CREATININE 0.79  CALCIUM 8.8*  AST 19  ALT 14  ALKPHOS 90  BILITOT 0.3   ------------------------------------------------------------------------------------------------------------------  Cardiac Enzymes Recent Labs  Lab 11/16/17 1446  TROPONINI <0.03   ------------------------------------------------------------------------------------------------------------------  RADIOLOGY:  Ct Head Wo Contrast  Result Date: 11/16/2017 CLINICAL DATA:  Right facial droop and slurred speech. EXAM: CT HEAD WITHOUT CONTRAST TECHNIQUE: Contiguous axial images were obtained from the base of the skull through the vertex without intravenous contrast. COMPARISON:  10/28/2015. FINDINGS: Brain: Diffusely enlarged ventricles and subarachnoid spaces. Patchy white matter low density in both cerebral hemispheres. No intracranial hemorrhage, mass lesion or CT evidence of acute infarction. Vascular: No hyperdense vessel or unexpected calcification. Skull: Normal. Negative for fracture or focal lesion. Sinuses/Orbits: Status post bilateral cataract extraction. Extensive left sphenoid and right maxillary sinus mucosal thickening with fluid or retained secretions in the right maxillary sinus. Other: None. IMPRESSION: 1. No acute intracranial abnormality. 2. Stable atrophy and chronic small vessel white matter ischemic changes. 3. 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 Electronically Signed   By: Claudie Revering M.D.   On: 11/16/2017 15:20   Mr Brain Wo Contrast  Result Date: 11/16/2017 CLINICAL DATA:  New onset expressive aphasia beginning this morning upon waking. The patient denies other focal neurologic symptoms.  EXAM: MRI HEAD WITHOUT CONTRAST TECHNIQUE: Multiplanar, multiecho pulse sequences of the brain and surrounding structures were obtained without intravenous contrast. COMPARISON:  CT head without contrast 11/16/2017. FINDINGS: Brain: Diffusion-weighted images demonstrate no acute or subacute infarction. Moderate atrophy and white matter changes are present bilaterally. No acute hemorrhage or mass lesion is present. The ventricles are proportionate to the degree of atrophy. White matter changes extend into the brainstem. The internal auditory canals are within normal limits. The cerebellum is unremarkable. Vascular: Flow is present in the major intracranial arteries. Skull and upper cervical spine: The skull base is within normal limits. The craniocervical junction is normal. Sinuses/Orbits: There is near complete opacification of the left sphenoid sinus. Circumferential mucosal disease is present in the right maxillary sinus. A fluid level is also present in the right maxillary sinus. The remaining paranasal sinuses and the mastoid air cells are clear. Bilateral lens replacements are present. Globes and orbits are otherwise within normal limits. IMPRESSION: 1. No acute or subacute infarction. 2. Moderate periventricular and subcortical white matter disease and atrophy likely reflects the sequela of chronic microvascular ischemia. 3. Acute on chronic right maxillary sinus disease. 4. Chronic left sphenoid sinus disease. Electronically Signed   By: San Morelle M.D.   On: 11/16/2017 19:48   US Carotid Bilateral  Result Date: 11/16/2017 CLINICAL DATA:  Right facial droop and slurred speech EXAM: BILATERAL CAROTID DUPLEX ULTRASOUND TECHNIQUE: Pearline Cables scale imaging, color Doppler and duplex ultrasound were performed of bilateral carotid and vertebral arteries in the neck. COMPARISON:  Head CT Nov 16, 2017. FINDINGS: Criteria: Quantification of carotid stenosis is based on velocity parameters that correlate the  residual internal carotid diameter with NASCET-based stenosis levels, using the diameter of the distal internal carotid lumen as the denominator for stenosis measurement. The following peak systolic velocity measurements were obtained: RIGHT ICA: 53 cm/sec CCA: 94 cm/sec SYSTOLIC ICA/CCA RATIO:  0.6 ECA:  74 cm/sec LEFT ICA: 62 cm/sec CCA: 93 cm/sec SYSTOLIC ICA/CCA RATIO:  0.7 ECA:  62 cm/sec RIGHT CAROTID ARTERY: There is mild generalized wall thickening. There is smooth plaque in the right bifurcation region causing 20-25% diameter stenosis by real-time interrogation. No stenosis approaching 50% diameter is appreciated by real-time interrogation. RIGHT VERTEBRAL ARTERY: Flow in right vertebral artery is antegrade. Visualized right vertebral artery waveform appears unremarkable. LEFT CAROTID ARTERY: There is mild generalized minimal thickening. There is fat in the left bifurcation and proximal internal carotid regions causing 25-30% diameter stenosis by real-time interrogation. No stenosis approaching 50% diameter appreciated in the left internal carotid artery. There is mild irregular plaque proximal external carotid artery causing less than 50% diameter stenosis. LEFT VERTEBRAL ARTERY: Flow in left vertebral artery is antegrade. Visualized left vertebral artery waveform appears unremarkable. IMPRESSION: No evidence of stenosis approaching 50% diameter in either extracranial carotid system. Relatively mild plaque in each bifurcation proximal internal carotid artery. Slightly irregular plaque in the proximal external carotid artery on the left. Flow in each vertebral artery is in the anatomic direction. Electronically Signed   By: Lowella Grip III M.D.   On: 11/16/2017 17:20     ASSESSMENT AND PLAN:   Principal Problem:   TIA (transient ischemic attack) Active Problems:   Bipolar I disorder, most recent episode depressed, severe without psychotic features ([place])   Essential hypertension   Seizure  ([place])   #[age] year old female with slurred speech started 2 days ago admitted for evaluation of acute stroke.   speech difficulties, patient speech is slurred likely secondary to recent restarting the Neurontin and medication effect rather than acute stroke.  Hold the Neurontin, and see if speech gets better.  Discussed this with patient.  MRI brain is negative.  Ultrasound of carotids did not show any hemodynamically significant plaque.  Speech therapy to see the patient, physical therapy recommended SNF placement.  Patient is from [facility] assisted living facility.  DC Plavix, continue aspirin.  2.  Bipolar depression, patient takes Seroquel. 3.  Restless leg syndrome: Continue Requip. 4.   history of seizure disorder, patient is on Lamictal, Depakote. 5.  History of inflamed seborrheic dermatitis, eczema, continue Kenalog ointment,  Note patient was seen in the emergency room recently for tearfulness, alcohol abuse problems, patient told me that she drinks but she does not drink s on a daily basis but denies heavy drinking, and usually does not get withdrawal symptoms.  All the records are reviewed and case discussed with Care Management/Social Workerr. Management plans discussed with the patient, family and they are in agreement.  CODE STATUS: Full code                              TOTAL TIME TAKING CARE OF THIS PATIENT: 35 minutes.   POSSIBLE D/C IN 1-2.  DAYS, DEPENDING ON CLINICAL CONDITION.   Epifanio Lesches M.D on 11/17/2017 at 9:49 AM  Between 7am to 6pm - Pager - [phone]  After 6pm go to [email or web address] - password [email or web address] [facility]  Office  [phone]  CC: Primary care physician; Olin Hauser, DO

## 2017-11-17 NOTE — Clinical Social Work Note (Signed)
CSW Received a consult for possible SNF placement. CSW will follow pending PT recommendations. The patient is currently observation status with traditional Medicare; therefore, SNF would be private pay should that be the patient's choice.  Santiago Bumpers, MSW, Latanya Presser [phone]

## 2017-11-18 DIAGNOSIS — G40909 Epilepsy, unspecified, not intractable, without status epilepticus: Secondary | ICD-10-CM | POA: Diagnosis not present

## 2017-11-18 DIAGNOSIS — R4781 Slurred speech: Secondary | ICD-10-CM | POA: Diagnosis not present

## 2017-11-18 DIAGNOSIS — I10 Essential (primary) hypertension: Secondary | ICD-10-CM | POA: Diagnosis not present

## 2017-11-18 DIAGNOSIS — G459 Transient cerebral ischemic attack, unspecified: Secondary | ICD-10-CM | POA: Diagnosis not present

## 2017-11-18 DIAGNOSIS — F319 Bipolar disorder, unspecified: Secondary | ICD-10-CM | POA: Diagnosis not present

## 2017-11-18 MED ORDER — ASPIRIN 81 MG PO TBEC: 81.0000 mg | DELAYED_RELEASE_TABLET | Freq: Every day | ORAL | 0 refills | Status: AC

## 2017-11-18 MED ORDER — DOXYCYCLINE HYCLATE 100 MG PO TABS: 100.0000 mg | ORAL_TABLET | Freq: Two times a day (BID) | ORAL | 0 refills | Status: AC

## 2017-11-18 MED ORDER — SODIUM CHLORIDE 0.9% FLUSH
3.0000 mL | Freq: Two times a day (BID) | INTRAVENOUS | Status: DC
  Administered 2017-11-18: 3 mL via INTRAVENOUS

## 2017-11-18 NOTE — Care Management (Signed)
Discharge to home today per Dr. Manuella Ghazi. Ms. Hopke is willing to accept [place] per [facility]. Will fax orders to [phone] No transportation. Will issue Taxi voucher Shelbie Ammons RN MSN CCM care Management  [phone]

## 2017-11-18 NOTE — Care Management Note (Signed)
Case Management Note  Patient Details  Name: SHATANA SAXTON MRN: [id number] Date of Birth: [birth year]  Subjective/Objective: Admitted to [facility] with the diagnosis of TIA. Lives alone at [place]. Discharged from this facility 11/05/17. Previously had been seen by Psych and was IVC'd. APS referral was placed per [place] Worker at that time. AA information was given States she did live at [facility] in 2012 x 5 years. Recently moved back to [facility] "just a few weeks ago." Moved from [place] to  2012. 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 Seen by Dr. Armando Gang 07/2017. Prescriptions are filled at [facility] on [address]. No home Health. States she was in a skilled facility many years ago. No home oxygen. Rolling walker in the home. Takes care of all basic activities of daily living herself, doesn't drive. Hasn't fallen in a long time. THN in the past. Good appetite                   Action/Plan: Physical therapy evaluation completed. Recommending home with Home Health/physical therapy. 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    Expected Discharge Date:  11/18/17               Expected Discharge Plan:     In-House Referral:     Discharge planning Services     Post Acute Care Choice:    Choice offered to:     DME Arranged:    DME Agency:     HH Arranged:    HH Agency:     Status of Service:     If discussed at [employer], dates discussed:    Additional Comments:  Shelbie Ammons, RN MSN CCM Care Management [phone] 11/18/2017, 12:02 PM

## 2017-11-18 NOTE — Progress Notes (Signed)
Neurology:  77 y.o. female has a past medical history significant for seizures, HTN, and bipolar disorder who presents to ER with slurred speech and expressive aphasia. Significantly improved and hard to state if she is back to baseline. No acute abnormalities on MRI    Neurological Examination   Mental Status: Alert, oriented to name and location . Cranial Nerves: II: Discs flat bilaterally; Visual fields grossly normal, pupils equal, round, reactive to light and accommodation III,IV, VI: ptosis not present, extra-ocular motions intact bilaterally V,VII: smile symmetric, facial light touch sensation normal bilaterally VIII: hearing normal bilaterally IX,X: gag reflex present XI: bilateral shoulder shrug XII: midline tongue extension Motor: Right : Upper extremity   5/5    Left:     Upper extremity   4/5  Lower extremity   5/5     Lower extremity   5/5 Tone and bulk:normal tone throughout; no atrophy noted Sensory: Pinprick and light touch intact throughout, bilaterally Deep Tendon Reflexes: 2+ and symmetric throughout Plantars: Right: downgoing   Left: downgoing Cerebellar: Not tested Gait: not tested  -  LUE weaker due to chronic L shoulder cuff repair - pts speech fluctuates. She appears to be very tearful and possibly depressed which is likely a component of current situation  - She wants to go home  - pt/ot

## 2017-11-18 NOTE — Discharge Instructions (Signed)
Bipolar 1 Disorder Bipolar 1 disorder is a mental health disorder in which a person has episodes of emotional highs (mania), and may also have episodes of emotional lows (depression) in addition to highs. Bipolar 1 disorder is different from other bipolar disorders because it involves extreme manic episodes. These episodes last at least one week or involve symptoms that are so severe that hospitalization is needed to keep the person safe. What increases the risk? The cause of this condition is not known. However, certain factors make you more likely to have bipolar disorder, such as:  Having a family member with the disorder.  An imbalance of certain chemicals in the brain (neurotransmitters).  Stress, such as illness, financial problems, or a death.  Certain conditions that affect the brain or spinal cord (neurologic conditions).  Brain injury (trauma).  Having another mental health disorder, such as: ? Obsessive compulsive disorder. ? Schizophrenia.  What are the signs or symptoms? Symptoms of mania include:  Very high self-esteem or self-confidence.  Decreased need for sleep.  Unusual talkativeness or feeling a need to keep talking. Speech may be very fast. It may seem like you cannot stop talking.  Racing thoughts or constant talking, with quick shifts between topics that may or may not be related (flight of ideas).  Decreased ability to focus or concentrate.  Increased purposeful activity, such as work, studies, or social activity.  Increased nonproductive activity. This could be pacing, squirming and fidgeting, or finger and toe tapping.  Impulsive behavior and poor judgment. This may result in high-risk activities, such as having unprotected sex or spending a lot of money.  Symptoms of depression include:  Feeling sad, hopeless, or helpless.  Frequent or uncontrollable crying.  Lack of feeling or caring about anything.  Sleeping too much.  Moving more slowly  than usual.  Not being able to enjoy things you used to enjoy.  Wanting to be alone all the time.  Feeling guilty or worthless.  Lack of energy or motivation.  Trouble concentrating or remembering.  Trouble making decisions.  Increased appetite.  Thoughts of death, or the desire to harm yourself.  Sometimes, you may have a mixed mood. This means having symptoms of depression and mania. Stress can make symptoms worse. How is this diagnosed? To diagnose bipolar disorder, your health care provider may ask about your:  Emotional episodes.  Medical history.  Alcohol and drug use. This includes prescription medicines. Certain medical conditions and substances can cause symptoms that seem like bipolar disorder (secondary bipolar disorder).  How is this treated? Bipolar disorder is a long-term (chronic) illness. It is best controlled with ongoing (continuous) treatment rather than treatment only when symptoms occur. Treatment may include:  Medicine. Medicine can be prescribed by a provider who specializes in treating mental disorders (psychiatrist). ? Medicines called mood stabilizers are usually prescribed. ? If symptoms occur even while taking a mood stabilizer, other medicines may be added.  Psychotherapy. Some forms of talk therapy, such as cognitive-behavioral therapy (CBT), can provide support, education, and guidance.  Coping methods, such as journaling or relaxation exercises. These may include: ? Yoga. ? Meditation. ? Deep breathing.  Lifestyle changes, such as: ? Limiting alcohol and drug use. ? Exercising regularly. ? Getting plenty of sleep. ? Making healthy eating choices.  A combination of medicine, talk therapy, and coping methods is best. A procedure in which electricity is applied to the brain through the scalp (electroconvulsive therapy) may be used in cases of severe mania when medicine   and psychotherapy work too slowly or do not work. Follow these  instructions at home: Activity   Return to your normal activities as told by your health care provider.  Find activities that you enjoy, and make time to do them.  Exercise regularly as told by your health care provider. Lifestyle  Limit alcohol intake to no more than 1 drink a day for nonpregnant women and 2 drinks a day for men. One drink equals 12 oz of beer, 5 oz of wine, or 1 oz of hard liquor.  Follow a set schedule for eating and sleeping.  Eat a balanced diet that includes fresh fruits and vegetables, whole grains, low-fat dairy, and lean meat.  Get 7-8 hours of sleep each night. General instructions  Take over-the-counter and prescription medicines only as told by your health care provider.  Think about joining a support group. Your health care provider may be able to recommend a support group.  Talk with your family and loved ones about your treatment goals and how they can help.  Keep all follow-up visits as told by your health care provider. This is important. Where to find more information: For more information about bipolar disorder, visit the following websites:  National Alliance on Mental Illness: www.nami.org  U.S. National Institute of Mental Health: www.nimh.nih.gov  Contact a health care provider if:  Your symptoms get worse.  You have side effects from your medicine, and they get worse.  You have trouble sleeping.  You have trouble doing daily activities.  You feel unsafe in your surroundings.  You are dealing with substance abuse. Get help right away if:  You have new symptoms.  You have thoughts about harming yourself.  You self-harm. This information is not intended to replace advice given to you by your health care provider. Make sure you discuss any questions you have with your health care provider. Document Released: 09/17/2000 Document Revised: 02/05/2016 Document Reviewed: 02/09/2016 Elsevier Interactive Patient Education  2018  Elsevier Inc.  

## 2017-11-18 NOTE — Evaluation (Signed)
Occupational Therapy Evaluation Patient Details Name: Paula Frank MRN: [id number] DOB: 03-26-1941 Today's Date: 11/18/2017    History of Present Illness Pt is a 77 y.o. female presenting to hospital with facial droop and slurred speech.  Pt admitted with expressive aphasia.  Imaging negative for acute stroke.  Pt noted to have recently been started on Neurontin.  Pt noted in chart to be labile (in terms of behavior).  PMH includes Biploar 1, RLS, COPD, seizures, CKD, anxiety, chronic pain, EtOH abuse, seizures, lupus.   Clinical Impression   Met pt sidelying in bed, agreeable to OT. Pt refusing bed alarm, education given on importance and necessity for safety, pt stated "it's nobody's fault but mine if I fall", nursing aware. Pt presents with MH diagnosis at baseline and as emotionally labile, frequent crying and then laughing throughout session- easily redirectable from crying spells. Noticed pt with jerking motions of RUE and LEs, pt states "this happens when I get nervous and I am very nervous". Supine <> sit completed with HOB elevated at mod I assist. Sit <> stand and functional mobility with 2WW completed with mod I assist to chair. Pt immediately requests to get back to bed, education given on importance of functional mobility and sitting up to promote respiratory health to engage in functional BADL, pt understanding still requesting to get back into bed. Pt would benefit from intermittent supervision to ensure safety 2/2 to emotional lability and slight impulsivity.    Follow Up Recommendations  Supervision - Intermittent;Other (comment)(2/2 to pt baseline behavioral diagnoses- for safety)    Equipment Recommendations  None recommended by OT    Recommendations for Other Services       Precautions / Restrictions Precautions Precautions: Fall Precaution Comments: Aspiration, Pt refusing bed alarm (nursing aware) Restrictions Weight Bearing Restrictions: No      Mobility Bed  Mobility Overal bed mobility: Modified Independent             General bed mobility comments: supine <> sit with HOB elevated, no difficulties  Transfers Overall transfer level: Modified independent Equipment used: Rolling walker (2 wheeled)             General transfer comment: familiar with 2WW    Balance Overall balance assessment: Mild deficits observed, not formally tested(needs BUE support of walker)                                         ADL either performed or assessed with clinical judgement   ADL Overall ADL's : Needs assistance/impaired Eating/Feeding: Set up Eating/Feeding Details (indicate cue type and reason): pt gets meals provided at baseline, needs set up assist for aspiration precautions Grooming: Modified independent   Upper Body Bathing: Modified independent;Sitting Upper Body Bathing Details (indicate cue type and reason): uses shower chair at baseline Lower Body Bathing: Modified independent   Upper Body Dressing : Set up   Lower Body Dressing: Set up   Toilet Transfer: Min guard   Toileting- Clothing Manipulation and Hygiene: Min guard   Tub/ Banker: Min guard   Functional mobility during ADLs: Min guard General ADL Comments: Pt shows some impulsivity, but no LOB and able to complete BADL with min guard     Vision Patient Visual Report: No change from baseline       Perception     Praxis      Pertinent Vitals/Pain Pain Assessment:  No/denies pain     Hand Dominance     Extremity/Trunk Assessment Upper Extremity Assessment Upper Extremity Assessment: RUE deficits/detail;LUE deficits/detail RUE Deficits / Details: uses LUE to assist ROM of RUE, BUE shoulder flexion limited, 5/5 hand grip   Lower Extremity Assessment Lower Extremity Assessment: Overall WFL for tasks assessed       Communication Communication Communication: Other (comment)(slurred speech)   Cognition Arousal/Alertness:  Awake/alert Behavior During Therapy: Impulsive;Anxious(labile) Overall Cognitive Status: Within Functional Limits for tasks assessed                                     General Comments       Exercises     Shoulder Instructions      Home Living Family/patient expects to be discharged to:: Other (Comment)([facility] community) Living Arrangements: Alone                               Additional Comments: [employer].  Lives on 3rd floor with elevator access.      Prior Functioning/Environment Level of Independence: Needs assistance  Gait / Transfers Assistance Needed: Varies between mod I and independent with functional mobility, has 2WW for occasional use ADL's / Homemaking Assistance Needed: Meals provided by living community, showers with chair in shower            OT Problem List: Decreased range of motion;Decreased activity tolerance;Decreased cognition;Decreased safety awareness;Other (comment)(emotional lability and impulsivity)      OT Treatment/Interventions: Self-care/ADL training;Therapeutic activities    OT Goals(Current goals can be found in the care plan section) Acute Rehab OT Goals Patient Stated Goal: "I want to be a whole person again" OT Goal Formulation: With patient Time For Goal Achievement: 12/16/17 Potential to Achieve Goals: Good  OT Frequency: Min 2X/week   Barriers to D/C:            Co-evaluation              AM-PAC PT "6 Clicks" Daily Activity     Outcome Measure Help from another person eating meals?: A Little Help from another person taking care of personal grooming?: None Help from another person toileting, which includes using toliet, bedpan, or urinal?: A Little Help from another person bathing (including washing, rinsing, drying)?: A Little Help from another person to put on and taking off regular upper body clothing?: A Little Help from another person to put on and taking  off regular lower body clothing?: A Little 6 Click Score: 19   End of Session Equipment Utilized During Treatment: Gait belt;Rolling walker  Activity Tolerance: Patient tolerated treatment well Patient left: in bed;with call bell/phone within reach;Other (comment)(pt refusing bed alarm despite education, nursing aware)  OT Visit Diagnosis: Cognitive communication deficit (R41.841);Muscle weakness (generalized) (M62.81);Other symptoms and signs involving cognitive function;Other (comment)(difficulty attending to tasks)                Time: 1020-1055 OT Time Calculation (min): 35 min Charges:  OT General Charges $OT Visit: 1 Visit OT Evaluation $OT Eval Low Complexity: 1 Low G-Codes:     Zenovia Jarred, MSOT, OTR/L   [place] 11/18/2017, 11:13 AM

## 2017-11-18 NOTE — Progress Notes (Signed)
Pt desires discharge back to [facility] does not have transportation today. Taxi voucher provided with facility notified that pt will be transported via taxi. Oral and written AVS instructions done with pt; all valuables including cash and credit card returned, rx for doxcycling given with pt instructed to get filled today and start tonight- states she can do this. Awaiting arrival of taxi for discharge back to facility independent care level to self care

## 2017-11-18 NOTE — Progress Notes (Signed)
Pt doing well and has returned to baseline. Eating well. Ambulating independently with walker in room/hall. VSS. Less agitated moods today. Oral and written AVS instructions done with pt/sent with pt. Valuable returned. Hassan Rowan, case Freight forwarder, working with pt.

## 2017-11-18 NOTE — Progress Notes (Signed)
Transported in transport chair to taxi with voucher provided. Discharged to [facility], independent living, self care with [facility] referral.

## 2017-11-19 ENCOUNTER — Telehealth: Payer: Self-pay

## 2017-11-19 LAB — ECHOCARDIOGRAM COMPLETE
Height: 66 in
Weight: 2239.87 oz

## 2017-11-19 NOTE — Telephone Encounter (Signed)
I have made the 1st attempt to contact the patient or family member in charge, in order to follow up from recently being discharged from the hospital.   Patient was on the other line, requested a call back later. Will try patient this afternoon.

## 2017-11-19 NOTE — Telephone Encounter (Signed)
I have made the 2nd attempt to contact the patient or family member in charge, in order to follow up from recently being discharged from the hospital.unable to leave a voicemail,  but I will make another attempt at a different time.

## 2017-11-21 NOTE — Discharge Summary (Signed)
[place] [facility] [place] NAME: Paula Frank    MR#:  [id number]  DATE OF BIRTH:  [birth year]  DATE OF ADMISSION:  11/16/2017   ADMITTING PHYSICIAN: Idelle Crouch, MD  DATE OF DISCHARGE: 11/18/2017  4:38 PM  PRIMARY CARE PHYSICIAN: Olin Hauser, DO   ADMISSION DIAGNOSIS:  CVA (cerebrovascular accident) ([place]) [I63.9] Expressive aphasia [R47.01] DISCHARGE DIAGNOSIS:  Principal Problem:   TIA (transient ischemic attack) Active Problems:   Bipolar I disorder, most recent episode depressed, severe without psychotic features ([place])   Essential hypertension   Seizure ([place])  SECONDARY DIAGNOSIS:   Past Medical History:  Diagnosis Date  . Anxiety   . Chronic kidney disease   . Chronic pain   . Discoid lupus   . Diverticulitis   . Gastritis   . Lupus (systemic lupus erythematosus) ([place])   . Restless leg   . Seizures [facility])    HOSPITAL COURSE:  77 y.o. female has a past medical history significant for seizures, HTN, and bipolar disorder who presents to ER with slurred speech and expressive aphasia.  Her Neuro work up was WNL. MRI brain didn't show acute CVA. Neurologist saw her and felt this to be psych related. DISCHARGE CONDITIONS:  stable CONSULTS OBTAINED:  Treatment Team:  Leotis Pain, MD DRUG ALLERGIES:   Allergies  Allergen Reactions  . Iodinated Diagnostic Agents     Other reaction(s): Unknown Other reaction(s): Other (See Comments) Other reaction(s): Unknown  . Sulfa Antibiotics Rash    Other reaction(s): Unknown Other reaction(s): Unknown   . Amoxicillin Other (See Comments)  . Ioxaglate     Other reaction(s): Unknown Other reaction(s): Unknown Other reaction(s): Unknown   . Levaquin [Levofloxacin In D5w]   . Moxifloxacin Other (See Comments)    Other reaction(s): Unknown Other reaction(s): Unknown  . Moxifloxacin Hcl In Nacl     Other reaction(s): Unknown  . Penicillin G     Other  reaction(s): Other (See Comments) Other reaction(s): Unknown  . Erythromycin Rash    Canker sore  . Levofloxacin Rash    Other reaction(s): Unknown Other reaction(s): Unknown  . Sulfacetamide Sodium Rash    Other reaction(s): Unknown   DISCHARGE MEDICATIONS:   Allergies as of 11/18/2017      Reactions   Iodinated Diagnostic Agents    Other reaction(s): Unknown Other reaction(s): Other (See Comments) Other reaction(s): Unknown   Sulfa Antibiotics Rash   Other reaction(s): Unknown Other reaction(s): Unknown   Amoxicillin Other (See Comments)   Ioxaglate    Other reaction(s): Unknown Other reaction(s): Unknown Other reaction(s): Unknown   Levaquin [levofloxacin In D5w]    Moxifloxacin Other (See Comments)   Other reaction(s): Unknown Other reaction(s): Unknown   Moxifloxacin Hcl In Nacl    Other reaction(s): Unknown   Penicillin G    Other reaction(s): Other (See Comments) Other reaction(s): Unknown   Erythromycin Rash   Canker sore   Levofloxacin Rash   Other reaction(s): Unknown Other reaction(s): Unknown   Sulfacetamide Sodium Rash   Other reaction(s): Unknown      Medication List    TAKE these medications   aspirin 81 MG EC tablet Take 1 tablet (81 mg total) by mouth daily.   doxepin 25 MG capsule Commonly known as:  SINEQUAN Take 25 mg by mouth at bedtime.   doxycycline 100 MG tablet Commonly known as:  VIBRA-TABS Take 1 tablet (100 mg total) by mouth every 12 (twelve) hours.   fluticasone 50  MCG/ACT nasal spray Commonly known as:  FLONASE Place 2 sprays into both nostrils daily. Use for 4-6 weeks then stop and use seasonally or as needed.   gabapentin 100 MG capsule Commonly known as:  NEURONTIN Take 100 mg by mouth 3 (three) times daily.   loratadine 10 MG tablet Commonly known as:  CLARITIN Take 1 tablet (10 mg total) by mouth daily.   ondansetron 4 MG tablet Commonly known as:  ZOFRAN Take 1 tablet (4 mg total) by mouth every 8 (eight)  hours as needed for nausea or vomiting.   QUEtiapine 400 MG tablet Commonly known as:  SEROQUEL Take 400 mg by mouth at bedtime.   rOPINIRole 1 MG tablet Commonly known as:  REQUIP TAKE 1 TABLET EVERY MORNING AND 2 TABLETS EVERY NIGHT What changed:    how much to take  how to take this  when to take this  additional instructions        DISCHARGE INSTRUCTIONS:   DIET:  Regular diet DISCHARGE CONDITION:  Good ACTIVITY:  Activity as tolerated OXYGEN:  Home Oxygen: No.  Oxygen Delivery: room air DISCHARGE LOCATION:  home with home health  If you experience worsening of your admission symptoms, develop shortness of breath, life threatening emergency, suicidal or homicidal thoughts you must seek medical attention immediately by calling 911 or calling your MD immediately  if symptoms less severe.  You Must read complete instructions/literature along with all the possible adverse reactions/side effects for all the Medicines you take and that have been prescribed to you. Take any new Medicines after you have completely understood and accpet all the possible adverse reactions/side effects.   Please note  You were cared for by a hospitalist during your hospital stay. If you have any questions about your discharge medications or the care you received while you were in the hospital after you are discharged, you can call the unit and asked to speak with the hospitalist on call if the hospitalist that took care of you is not available. Once you are discharged, your primary care physician will handle any further medical issues. Please note that NO REFILLS for any discharge medications will be authorized once you are discharged, as it is imperative that you return to your primary care physician (or establish a relationship with a primary care physician if you do not have one) for your aftercare needs so that they can reassess your need for medications and monitor your lab values.    On  the day of Discharge:  VITAL SIGNS:  Blood pressure 130/74, pulse 76, temperature 98.1 F (36.7 C), temperature source Oral, resp. rate 18, height 5\' 6"  (1.676 m), weight 63.9 kg (140 lb 14.4 oz), SpO2 98 %. PHYSICAL EXAMINATION:  GENERAL:  77 y.o.-year-old patient lying in the bed with no acute distress.  EYES: Pupils equal, round, reactive to light and accommodation. No scleral icterus. Extraocular muscles intact.  HEENT: Head atraumatic, normocephalic. Oropharynx and nasopharynx clear.  NECK:  Supple, no jugular venous distention. No thyroid enlargement, no tenderness.  LUNGS: Normal breath sounds bilaterally, no wheezing, rales,rhonchi or crepitation. No use of accessory muscles of respiration.  CARDIOVASCULAR: S1, S2 normal. No murmurs, rubs, or gallops.  ABDOMEN: Soft, non-tender, non-distended. Bowel sounds present. No organomegaly or mass.  EXTREMITIES: No pedal edema, cyanosis, or clubbing.  NEUROLOGIC: Cranial nerves II through XII are intact. Muscle strength 5/5 in all extremities. Sensation intact. Gait not checked.  PSYCHIATRIC: The patient is alert and oriented x 3.  SKIN:  No obvious rash, lesion, or ulcer.  DATA REVIEW:   CBC Recent Labs  Lab 11/17/17 0429  WBC 5.7  HGB 11.6*  HCT 34.3*  PLT 364    Chemistries  Recent Labs  Lab 11/17/17 0429  NA 140  K 4.2  CL 109  CO2 27  GLUCOSE 80  BUN 19  CREATININE 0.79  CALCIUM 8.8*  AST 19  ALT 14  ALKPHOS 90  BILITOT 0.3     Follow-up Information    Olin Hauser, DO. Schedule an appointment as soon as possible for a visit in 5 day(s).   Specialty:  Family Medicine Contact information: [place] [address] [phone]        Vladimir Crofts, MD. Schedule an appointment as soon as possible for a visit in 2 week(s).   Specialty:  Neurology Contact information: [facility] [place] [address] [phone]            Management  plans discussed with the patient, family and they are in agreement.  CODE STATUS: Prior   TOTAL TIME TAKING CARE OF THIS PATIENT: 45 minutes.    Max Sane M.D on 11/21/2017 at 11:01 AM  Between 7am to 6pm - Pager - [phone]  After 6pm go to [email or web address] - Proofreader  [facility]  Office  [phone]  CC: Primary care physician; Olin Hauser, DO   Note: This dictation was prepared with Dragon dictation along with smaller phrase technology. Any transcriptional errors that result from this process are unintentional.

## 2017-11-23 ENCOUNTER — Other Ambulatory Visit: Payer: Self-pay | Admitting: Family Medicine

## 2017-11-23 DIAGNOSIS — G2581 Restless legs syndrome: Secondary | ICD-10-CM

## 2017-12-30 DIAGNOSIS — R41840 Attention and concentration deficit: Secondary | ICD-10-CM | POA: Diagnosis not present

## 2017-12-30 DIAGNOSIS — M6281 Muscle weakness (generalized): Secondary | ICD-10-CM | POA: Diagnosis not present

## 2017-12-30 DIAGNOSIS — R41844 Frontal lobe and executive function deficit: Secondary | ICD-10-CM | POA: Diagnosis not present

## 2017-12-30 DIAGNOSIS — R2681 Unsteadiness on feet: Secondary | ICD-10-CM | POA: Diagnosis not present

## 2018-01-01 DIAGNOSIS — R2681 Unsteadiness on feet: Secondary | ICD-10-CM | POA: Diagnosis not present

## 2018-01-01 DIAGNOSIS — R41840 Attention and concentration deficit: Secondary | ICD-10-CM | POA: Diagnosis not present

## 2018-01-01 DIAGNOSIS — R41844 Frontal lobe and executive function deficit: Secondary | ICD-10-CM | POA: Diagnosis not present

## 2018-01-01 DIAGNOSIS — M6281 Muscle weakness (generalized): Secondary | ICD-10-CM | POA: Diagnosis not present

## 2018-01-08 DIAGNOSIS — M546 Pain in thoracic spine: Secondary | ICD-10-CM | POA: Diagnosis not present

## 2018-01-08 DIAGNOSIS — M6281 Muscle weakness (generalized): Secondary | ICD-10-CM | POA: Diagnosis not present

## 2018-01-08 DIAGNOSIS — R2681 Unsteadiness on feet: Secondary | ICD-10-CM | POA: Diagnosis not present

## 2018-01-14 DIAGNOSIS — M6281 Muscle weakness (generalized): Secondary | ICD-10-CM | POA: Diagnosis not present

## 2018-01-14 DIAGNOSIS — M546 Pain in thoracic spine: Secondary | ICD-10-CM | POA: Diagnosis not present

## 2018-01-14 DIAGNOSIS — R2681 Unsteadiness on feet: Secondary | ICD-10-CM | POA: Diagnosis not present

## 2018-01-22 DIAGNOSIS — M6281 Muscle weakness (generalized): Secondary | ICD-10-CM | POA: Diagnosis not present

## 2018-01-22 DIAGNOSIS — R2681 Unsteadiness on feet: Secondary | ICD-10-CM | POA: Diagnosis not present

## 2018-01-22 DIAGNOSIS — M546 Pain in thoracic spine: Secondary | ICD-10-CM | POA: Diagnosis not present

## 2018-01-23 DIAGNOSIS — R2681 Unsteadiness on feet: Secondary | ICD-10-CM | POA: Diagnosis not present

## 2018-01-23 DIAGNOSIS — M546 Pain in thoracic spine: Secondary | ICD-10-CM | POA: Diagnosis not present

## 2018-01-23 DIAGNOSIS — M6281 Muscle weakness (generalized): Secondary | ICD-10-CM | POA: Diagnosis not present

## 2018-01-27 DIAGNOSIS — I4460 Unspecified fascicular block: Secondary | ICD-10-CM | POA: Diagnosis not present

## 2018-02-06 DIAGNOSIS — M546 Pain in thoracic spine: Secondary | ICD-10-CM | POA: Diagnosis not present

## 2018-02-06 DIAGNOSIS — R2681 Unsteadiness on feet: Secondary | ICD-10-CM | POA: Diagnosis not present

## 2018-02-06 DIAGNOSIS — M6281 Muscle weakness (generalized): Secondary | ICD-10-CM | POA: Diagnosis not present

## 2018-02-12 DIAGNOSIS — M546 Pain in thoracic spine: Secondary | ICD-10-CM | POA: Diagnosis not present

## 2018-02-12 DIAGNOSIS — M6281 Muscle weakness (generalized): Secondary | ICD-10-CM | POA: Diagnosis not present

## 2018-02-12 DIAGNOSIS — R2681 Unsteadiness on feet: Secondary | ICD-10-CM | POA: Diagnosis not present

## 2018-02-13 DIAGNOSIS — Z79899 Other long term (current) drug therapy: Secondary | ICD-10-CM | POA: Diagnosis not present

## 2018-03-10 DIAGNOSIS — R140 Abdominal distension (gaseous): Secondary | ICD-10-CM | POA: Diagnosis not present

## 2018-03-10 DIAGNOSIS — F603 Borderline personality disorder: Secondary | ICD-10-CM | POA: Diagnosis not present

## 2018-03-10 DIAGNOSIS — R1084 Generalized abdominal pain: Secondary | ICD-10-CM | POA: Diagnosis not present

## 2018-03-13 DIAGNOSIS — F339 Major depressive disorder, recurrent, unspecified: Secondary | ICD-10-CM | POA: Diagnosis not present

## 2018-03-13 DIAGNOSIS — L2081 Atopic neurodermatitis: Secondary | ICD-10-CM | POA: Diagnosis not present

## 2018-03-13 DIAGNOSIS — F17210 Nicotine dependence, cigarettes, uncomplicated: Secondary | ICD-10-CM | POA: Diagnosis not present

## 2018-03-13 DIAGNOSIS — R140 Abdominal distension (gaseous): Secondary | ICD-10-CM | POA: Diagnosis not present

## 2018-03-31 DIAGNOSIS — F319 Bipolar disorder, unspecified: Secondary | ICD-10-CM | POA: Diagnosis not present

## 2018-03-31 DIAGNOSIS — F609 Personality disorder, unspecified: Secondary | ICD-10-CM | POA: Diagnosis not present

## 2018-04-10 DIAGNOSIS — R110 Nausea: Secondary | ICD-10-CM | POA: Diagnosis not present

## 2018-04-10 DIAGNOSIS — R140 Abdominal distension (gaseous): Secondary | ICD-10-CM | POA: Diagnosis not present

## 2018-04-10 DIAGNOSIS — K5900 Constipation, unspecified: Secondary | ICD-10-CM | POA: Diagnosis not present

## 2018-04-10 DIAGNOSIS — K5909 Other constipation: Secondary | ICD-10-CM | POA: Diagnosis not present

## 2018-04-10 DIAGNOSIS — R531 Weakness: Secondary | ICD-10-CM | POA: Diagnosis not present

## 2018-04-11 ENCOUNTER — Other Ambulatory Visit: Payer: Self-pay

## 2018-04-11 ENCOUNTER — Emergency Department: Payer: Medicare Other

## 2018-04-11 ENCOUNTER — Encounter: Payer: Self-pay | Admitting: Emergency Medicine

## 2018-04-11 ENCOUNTER — Emergency Department
Admission: EM | Admit: 2018-04-11 | Discharge: 2018-04-11 | Disposition: A | Discharge status: Home / Self Care | Payer: Medicare Other | Attending: Student in an Organized Health Care Education/Training Program | Admitting: Student in an Organized Health Care Education/Training Program

## 2018-04-11 DIAGNOSIS — G309 Alzheimer's disease, unspecified: Secondary | ICD-10-CM | POA: Diagnosis not present

## 2018-04-11 DIAGNOSIS — I129 Hypertensive chronic kidney disease with stage 1 through stage 4 chronic kidney disease, or unspecified chronic kidney disease: Secondary | ICD-10-CM | POA: Insufficient documentation

## 2018-04-11 DIAGNOSIS — N189 Chronic kidney disease, unspecified: Secondary | ICD-10-CM | POA: Diagnosis not present

## 2018-04-11 DIAGNOSIS — R1084 Generalized abdominal pain: Secondary | ICD-10-CM

## 2018-04-11 DIAGNOSIS — K5900 Constipation, unspecified: Secondary | ICD-10-CM | POA: Insufficient documentation

## 2018-04-11 DIAGNOSIS — Z79899 Other long term (current) drug therapy: Secondary | ICD-10-CM | POA: Diagnosis not present

## 2018-04-11 DIAGNOSIS — N281 Cyst of kidney, acquired: Secondary | ICD-10-CM | POA: Diagnosis not present

## 2018-04-11 DIAGNOSIS — J449 Chronic obstructive pulmonary disease, unspecified: Secondary | ICD-10-CM | POA: Diagnosis not present

## 2018-04-11 DIAGNOSIS — F17210 Nicotine dependence, cigarettes, uncomplicated: Secondary | ICD-10-CM | POA: Diagnosis not present

## 2018-04-11 DIAGNOSIS — Z7982 Long term (current) use of aspirin: Secondary | ICD-10-CM | POA: Insufficient documentation

## 2018-04-11 DIAGNOSIS — R195 Other fecal abnormalities: Secondary | ICD-10-CM | POA: Diagnosis not present

## 2018-04-11 LAB — CBC
HEMATOCRIT: 39.1 % (ref 36.0–46.0)
HEMOGLOBIN: 12.7 g/dL (ref 12.0–15.0)
MCH: 33.5 pg (ref 26.0–34.0)
MCHC: 32.5 g/dL (ref 30.0–36.0)
MCV: 103.2 fL — AB (ref 80.0–100.0)
NRBC: 0 % (ref 0.0–0.2)
PLATELETS: 407 10*3/uL — AB (ref 150–400)
RBC: 3.79 MIL/uL — AB (ref 3.87–5.11)
RDW: 14.5 % (ref 11.5–15.5)
WBC: 9.1 10*3/uL (ref 4.0–10.5)

## 2018-04-11 LAB — COMPREHENSIVE METABOLIC PANEL
ALBUMIN: 3.5 g/dL (ref 3.5–5.0)
ALT: 15 U/L (ref 0–44)
AST: 21 U/L (ref 15–41)
Alkaline Phosphatase: 135 U/L — ABNORMAL HIGH (ref 38–126)
Anion gap: 8 (ref 5–15)
BUN: 7 mg/dL — ABNORMAL LOW (ref 8–23)
CALCIUM: 8.8 mg/dL — AB (ref 8.9–10.3)
CO2: 26 mmol/L (ref 22–32)
Chloride: 104 mmol/L (ref 98–111)
Creatinine, Ser: 0.8 mg/dL (ref 0.44–1.00)
GFR calc non Af Amer: >60 mL/min (ref >60)
GLUCOSE: 90 mg/dL (ref 70–99)
POTASSIUM: 3.9 mmol/L (ref 3.5–5.1)
SODIUM: 138 mmol/L (ref 135–145)
Total Bilirubin: 0.4 mg/dL (ref 0.3–1.2)
Total Protein: 7.4 g/dL (ref 6.5–8.1)

## 2018-04-11 LAB — URINALYSIS, COMPLETE (UACMP) WITH MICROSCOPIC
BILIRUBIN URINE: NEGATIVE
Glucose, UA: NEGATIVE mg/dL
Hgb urine dipstick: NEGATIVE
Ketones, ur: NEGATIVE mg/dL
Nitrite: POSITIVE — AB
Protein, ur: NEGATIVE mg/dL
SPECIFIC GRAVITY, URINE: 1.003 — AB (ref 1.005–1.030)
pH: 5 (ref 5.0–8.0)

## 2018-04-11 LAB — LIPASE, BLOOD: LIPASE: 18 U/L (ref 11–51)

## 2018-04-11 MED ORDER — SODIUM CHLORIDE 0.9 % IV BOLUS: 1000.0000 mL | Freq: Once | INTRAVENOUS | Status: DC

## 2018-04-11 MED ORDER — FOSFOMYCIN TROMETHAMINE 3 G PO PACK
3.0000 g | PACK | Freq: Once | ORAL | Status: AC
  Administered 2018-04-11: 3 g via ORAL
  Filled 2018-04-11: qty 3

## 2018-04-11 MED ORDER — MORPHINE SULFATE (PF) 2 MG/ML IV SOLN
2.0000 mg | INTRAVENOUS | Status: DC | PRN
  Administered 2018-04-11: 2 mg via INTRAVENOUS
  Filled 2018-04-11: qty 1

## 2018-04-11 MED ORDER — LACTULOSE 20 G PO PACK: 20.0000 g | PACK | Freq: Three times a day (TID) | ORAL | 0 refills | Status: AC

## 2018-04-11 MED ORDER — ONDANSETRON HCL 4 MG/2ML IJ SOLN
4.0000 mg | Freq: Once | INTRAMUSCULAR | Status: AC
  Administered 2018-04-11: 4 mg via INTRAVENOUS
  Filled 2018-04-11: qty 2

## 2018-04-11 NOTE — ED Notes (Signed)
Reports that since being here she has been able to pass gas and burp which she has not been able to do recently.

## 2018-04-11 NOTE — ED Triage Notes (Signed)
Pt found outside smoking when called for triage.  Pt arrived via POV with reports of abdominal distention for several days and decreased size of stools, abdominal discomfort, gas and burping.  Pt states her last BM was yesterday, but was smaller than use. Pt has had decreased PO intake, pt states she vomited x1 last night.  Pt had XR from GI that showed sm ileus vs bowel obstruction.   Pt tearful in triage re: husband's death.

## 2018-04-11 NOTE — ED Notes (Signed)
CT in room giving oral barium due to pt being allergic to contrast. One to drink at 1415 and one to drink at 1515.

## 2018-04-11 NOTE — ED Notes (Signed)
Patient transported to CT 

## 2018-04-11 NOTE — Discharge Instructions (Addendum)

## 2018-04-11 NOTE — ED Notes (Signed)
Sylvia states no transportation from them available.  They gave phone number for [facility] for possible transport [phone] Lonell Face owner).

## 2018-04-11 NOTE — ED Notes (Signed)
Pt is done drinking her contrast, CT notified

## 2018-04-11 NOTE — ED Provider Notes (Signed)
[facility] Emergency Department Provider Note    None    (approximate)  I have reviewed the triage vital signs and the nursing notes.   HISTORY  Chief Complaint Abdominal Pain and Constipation    HPI Paula Frank is a 77 y.o. female below listed past medical history presents to the ER for evaluation of abdominal distention and abdominal discomfort with constipation.  Patient states the pain is crampy in nature but denies any fevers.  States she takes MiraLAX but has not had much improvement.  Denies any chest pain or shortness of breath.    Past Medical History:  Diagnosis Date  . Anxiety   . Chronic kidney disease   . Chronic pain   . Discoid lupus   . Diverticulitis   . Gastritis   . Lupus (systemic lupus erythematosus) ([place])   . Restless leg   . Seizures ([place])    Family History  Problem Relation Age of Onset  . Cancer Mother   . Esophageal cancer Father   . Cancer Son   . Breast cancer Sister    Past Surgical History:  Procedure Laterality Date  . ABDOMINAL HYSTERECTOMY    . APPENDECTOMY    . KNEE ARTHROSCOPY W/ MENISCECTOMY    . LAPAROSCOPY    . LITHOTRIPSY    . ROTATOR CUFF REPAIR    . ROTATOR CUFF REPAIR Bilateral    x2  . TUBAL LIGATION     Patient Active Problem List   Diagnosis Date Noted  . TIA (transient ischemic attack) 11/16/2017  . Substance induced mood disorder ([place]) 11/05/2017  . Constipation, chronic 07/11/2017  . COPD (chronic obstructive pulmonary disease) ([place]) 11/21/2016  . Appetite loss 10/09/2016  . Dermatitis 07/23/2016  . Nausea & vomiting 04/09/2016  . Herpes labialis 02/23/2016  . Abdominal swelling, generalized 01/26/2016  . Bilateral arm weakness 10/12/2015  . Difficulty walking 10/12/2015  . Neck pain 10/12/2015  . Rash 10/12/2015  . Spell of altered consciousness 10/12/2015  . 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 Bipolar I disorder, most recent episode depressed, severe without psychotic  features ([place]) 02/16/2015  . Alcohol abuse 02/16/2015  . Alzheimer's dementia ([place]) 02/16/2015  . 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 Current tobacco use 02/19/2014  . Systemic lupus erythematosus ([place]) 01/14/2014  . Seizure ([place]) 10/30/2013  . Disordered sleep 10/30/2013  . Adynamia 10/30/2013  . Essential hypertension 05/19/2012  . Right shoulder injury 05/16/2012  . Fatigue 11/20/2011      Prior to Admission medications   Medication Sig Start Date End Date Taking? Authorizing Provider  aspirin EC 81 MG EC tablet Take 1 tablet (81 mg total) by mouth daily. 11/19/17   Max Sane, MD  doxepin (SINEQUAN) 25 MG capsule Take 25 mg by mouth at bedtime.    [provider]  doxycycline (VIBRA-TABS) 100 MG tablet Take 1 tablet (100 mg total) by mouth every 12 (twelve) hours. 11/18/17   Max Sane, MD  fluticasone (FLONASE) 50 MCG/ACT nasal spray Place 2 sprays into both nostrils daily. Use for 4-6 weeks then stop and use seasonally or as needed. 10/09/16   Karamalegos, Devonne Doughty, DO  gabapentin (NEURONTIN) 100 MG capsule Take 100 mg by mouth 3 (three) times daily.    [provider]  lactulose (CEPHULAC) 20 g packet Take 1 packet (20 g total) by mouth 3 (three) times daily. 04/11/18  Merlyn Lot, MD  loratadine (CLARITIN) 10 MG tablet Take 1 tablet (10 mg total) by mouth daily. 07/23/16   Arlis Porta., MD  ondansetron (ZOFRAN) 4 MG tablet Take 1 tablet (4 mg total) by mouth every 8 (eight) hours as needed for nausea or vomiting. 07/31/17   Karamalegos, Devonne Doughty, DO  QUEtiapine (SEROQUEL) 400 MG tablet Take 400 mg by mouth at bedtime.     [provider]  rOPINIRole (REQUIP) 1 MG tablet TAKE 1 TABLET EVERY MORNING AND 2 TABLETS EVERY NIGHT 11/24/17   Parks Ranger, Devonne Doughty, DO    Allergies Iodinated diagnostic agents; Sulfa  antibiotics; Amoxicillin; Ioxaglate; Levaquin [levofloxacin in d5w]; Moxifloxacin; Moxifloxacin hcl in nacl; Penicillin g; Erythromycin; Levofloxacin; and Sulfacetamide sodium    Social History Social History   Tobacco Use  . Smoking status: Current Every Day Smoker    Packs/day: 2.00    Years: 60.00    Pack years: 120.00    Types: Cigarettes  . Smokeless tobacco: Never Used  Substance Use Topics  . Alcohol use: Yes    Alcohol/week: 23.0 - 30.0 standard drinks    Types: 7 - 14 Standard drinks or equivalent, 2 Glasses of wine, 14 Shots of liquor per week  . Drug use: No    Review of Systems Patient denies headaches, rhinorrhea, blurry vision, numbness, shortness of breath, chest pain, edema, cough, abdominal pain, nausea, vomiting, diarrhea, dysuria, fevers, rashes or hallucinations unless otherwise stated above in HPI. ____________________________________________   PHYSICAL EXAM:  VITAL SIGNS: Vitals:   04/11/18 1500 04/11/18 1534  BP: (!) 166/97 (!) 183/107  Pulse: 99 (!) 103  Resp: (!) 31 (!) 22  Temp:  (!) 97.5 F (36.4 C)  SpO2: 95% 95%    Constitutional: Alert and oriented.  Eyes: Conjunctivae are normal.  Head: Atraumatic. Nose: No congestion/rhinnorhea. Mouth/Throat: Mucous membranes are moist.   Neck: No stridor. Painless ROM.  Cardiovascular: Normal rate, regular rhythm. Grossly normal heart sounds.  Good peripheral circulation. Respiratory: Normal respiratory effort.  No retractions. Lungs CTAB. Gastrointestinal: Soft , tympanic to palpation, hperactive bowel soundsno abdominal bruits. No CVA tenderness. Genitourinary:  Musculoskeletal: No lower extremity tenderness nor edema.  No joint effusions. Neurologic:  Normal speech and language. No gross focal neurologic deficits are appreciated. No facial droop Skin:  Skin is warm, dry and intact. No rash noted. Psychiatric: Mood and affect are normal. Speech and behavior are  normal.  ____________________________________________   LABS (all labs ordered are listed, but only abnormal results are displayed)  Results for orders placed or performed during the hospital encounter of 04/11/18 (from the past 24 hour(s))  Urinalysis, Complete w Microscopic     Status: Abnormal   Collection Time: 04/11/18 11:32 AM  Result Value Ref Range   Color, Urine YELLOW (A) YELLOW   APPearance CLEAR (A) CLEAR   Specific Gravity, Urine 1.003 (L) 1.005 - 1.030   pH 5.0 5.0 - 8.0   Glucose, UA NEGATIVE NEGATIVE mg/dL   Hgb urine dipstick NEGATIVE NEGATIVE   Bilirubin Urine NEGATIVE NEGATIVE   Ketones, ur NEGATIVE NEGATIVE mg/dL   Protein, ur NEGATIVE NEGATIVE mg/dL   Nitrite POSITIVE (A) NEGATIVE   Leukocytes, UA TRACE (A) NEGATIVE   RBC / HPF 0-5 0 - 5 RBC/hpf   WBC, UA 0-5 0 - 5 WBC/hpf   Bacteria, UA FEW (A) NONE SEEN   Squamous Epithelial / LPF 0-5 0 - 5   WBC Clumps PRESENT    Mucus PRESENT  Lipase, blood     Status: None   Collection Time: 04/11/18 11:51 AM  Result Value Ref Range   Lipase 18 11 - 51 U/L  Comprehensive metabolic panel     Status: Abnormal   Collection Time: 04/11/18 11:51 AM  Result Value Ref Range   Sodium 138 135 - 145 mmol/L   Potassium 3.9 3.5 - 5.1 mmol/L   Chloride 104 98 - 111 mmol/L   CO2 26 22 - 32 mmol/L   Glucose, Bld 90 70 - 99 mg/dL   BUN 7 (L) 8 - 23 mg/dL   Creatinine, Ser 0.80 0.44 - 1.00 mg/dL   Calcium 8.8 (L) 8.9 - 10.3 mg/dL   Total Protein 7.4 6.5 - 8.1 g/dL   Albumin 3.5 3.5 - 5.0 g/dL   AST 21 15 - 41 U/L   ALT 15 0 - 44 U/L   Alkaline Phosphatase 135 (H) 38 - 126 U/L   Total Bilirubin 0.4 0.3 - 1.2 mg/dL   GFR calc non Af Amer >60 >60 mL/min   GFR calc Af Amer >60 >60 mL/min   Anion gap 8 5 - 15  CBC     Status: Abnormal   Collection Time: 04/11/18 11:51 AM  Result Value Ref Range   WBC 9.1 4.0 - 10.5 K/uL   RBC 3.79 (L) 3.87 - 5.11 MIL/uL   Hemoglobin 12.7 12.0 - 15.0 g/dL   HCT 39.1 36.0 - 46.0 %   MCV  103.2 (H) 80.0 - 100.0 fL   MCH 33.5 26.0 - 34.0 pg   MCHC 32.5 30.0 - 36.0 g/dL   RDW 14.5 11.5 - 15.5 %   Platelets 407 (H) 150 - 400 K/uL   nRBC 0.0 0.0 - 0.2 %   ____________________________________________  EKG My review and personal interpretation at Time: 11:40   Indication: abd pain  Rate: 95  Rhythm: sinus Axis: normal Other: normal intervals, no stemi ____________________________________________  RADIOLOGY  I personally reviewed all radiographic images ordered to evaluate for the above acute complaints and reviewed radiology reports and findings.  These findings were personally discussed with the patient.  Please see medical record for radiology report.  ____________________________________________   PROCEDURES  Procedure(s) performed:  Procedures    Critical Care performed: no ____________________________________________   INITIAL IMPRESSION / ASSESSMENT AND PLAN / ED COURSE  Pertinent labs & imaging results that were available during my care of the patient were reviewed by me and considered in my medical decision making (see chart for details).   DDX: Obstipation, SBO, diverticulitis, enteritis UTI  Paula Frank is a 77 y.o. who presents to the ED with symptoms as described above.  Patient with mildly distended abdomen but not talked and without any peritonitis.  Given her age and risk factors CT imaging will be ordered for the above differential.  The patient will be placed on continuous pulse oximetry and telemetry for monitoring.  Laboratory evaluation will be sent to evaluate for the above complaints.     Clinical Course as of Apr 11 1608  Fri Apr 11, 2018  1445 Patient noted to have nitro positive UTI.  No evidence of sepsis.  No signs of pyelonephritis.  Will give dose of fosfomycin.   [PR]  1601 CT imaging shows no evidence of obstructive pattern.  There is large stool volume.  Repeat abdominal exam is soft.  She is tolerating oral hydration.   States she is now moving her bowels and passing gas.  States that  she feels comfortable going back to [facility].  Have discussed with the patient and available family all diagnostics and treatments performed thus far and all questions were answered to the best of my ability. The patient demonstrates understanding and agreement with plan.    [PR]    Clinical Course User Index [PR] Merlyn Lot, MD     As part of my medical decision making, I reviewed the following data within the [place] notes reviewed and incorporated, Labs reviewed, notes from prior ED visits and [place] Controlled Substance Database   ____________________________________________   FINAL CLINICAL IMPRESSION(S) / ED DIAGNOSES  Final diagnoses:  Constipation, unspecified constipation type  Generalized abdominal pain      NEW MEDICATIONS STARTED DURING THIS VISIT:  New Prescriptions   LACTULOSE (CEPHULAC) 20 G PACKET    Take 1 packet (20 g total) by mouth 3 (three) times daily.     Note:  This document was prepared using Dragon voice recognition software and may include unintentional dictation errors.    Merlyn Lot, MD 04/11/18 [phone]

## 2018-06-05 DIAGNOSIS — L2081 Atopic neurodermatitis: Secondary | ICD-10-CM | POA: Diagnosis not present

## 2018-06-05 DIAGNOSIS — R140 Abdominal distension (gaseous): Secondary | ICD-10-CM | POA: Diagnosis not present

## 2018-06-05 DIAGNOSIS — M549 Dorsalgia, unspecified: Secondary | ICD-10-CM | POA: Diagnosis not present

## 2018-06-05 DIAGNOSIS — F603 Borderline personality disorder: Secondary | ICD-10-CM | POA: Diagnosis not present

## 2018-06-26 DIAGNOSIS — R112 Nausea with vomiting, unspecified: Secondary | ICD-10-CM | POA: Diagnosis not present

## 2018-06-26 DIAGNOSIS — R0982 Postnasal drip: Secondary | ICD-10-CM | POA: Diagnosis not present

## 2018-06-26 DIAGNOSIS — F0390 Unspecified dementia without behavioral disturbance: Secondary | ICD-10-CM | POA: Diagnosis not present

## 2018-07-03 DIAGNOSIS — M545 Low back pain: Secondary | ICD-10-CM | POA: Diagnosis not present

## 2018-07-03 DIAGNOSIS — F339 Major depressive disorder, recurrent, unspecified: Secondary | ICD-10-CM | POA: Diagnosis not present

## 2018-07-03 DIAGNOSIS — F0390 Unspecified dementia without behavioral disturbance: Secondary | ICD-10-CM | POA: Diagnosis not present

## 2018-07-03 DIAGNOSIS — F17210 Nicotine dependence, cigarettes, uncomplicated: Secondary | ICD-10-CM | POA: Diagnosis not present

## 2018-07-24 DIAGNOSIS — F411 Generalized anxiety disorder: Secondary | ICD-10-CM | POA: Diagnosis not present

## 2018-07-24 DIAGNOSIS — L2081 Atopic neurodermatitis: Secondary | ICD-10-CM | POA: Diagnosis not present

## 2018-07-24 DIAGNOSIS — F603 Borderline personality disorder: Secondary | ICD-10-CM | POA: Diagnosis not present

## 2018-07-24 DIAGNOSIS — F339 Major depressive disorder, recurrent, unspecified: Secondary | ICD-10-CM | POA: Diagnosis not present

## 2018-08-07 DIAGNOSIS — F339 Major depressive disorder, recurrent, unspecified: Secondary | ICD-10-CM | POA: Diagnosis not present

## 2018-08-07 DIAGNOSIS — F0390 Unspecified dementia without behavioral disturbance: Secondary | ICD-10-CM | POA: Diagnosis not present

## 2018-08-07 DIAGNOSIS — F411 Generalized anxiety disorder: Secondary | ICD-10-CM | POA: Diagnosis not present

## 2018-08-07 DIAGNOSIS — L02219 Cutaneous abscess of trunk, unspecified: Secondary | ICD-10-CM | POA: Diagnosis not present

## 2018-08-07 DIAGNOSIS — R42 Dizziness and giddiness: Secondary | ICD-10-CM | POA: Diagnosis not present

## 2018-08-12 DIAGNOSIS — R1013 Epigastric pain: Secondary | ICD-10-CM | POA: Diagnosis not present

## 2018-08-12 DIAGNOSIS — R6881 Early satiety: Secondary | ICD-10-CM | POA: Diagnosis not present

## 2018-08-12 DIAGNOSIS — R140 Abdominal distension (gaseous): Secondary | ICD-10-CM | POA: Diagnosis not present

## 2018-08-12 DIAGNOSIS — R5383 Other fatigue: Secondary | ICD-10-CM | POA: Diagnosis not present

## 2018-08-13 ENCOUNTER — Other Ambulatory Visit: Payer: Self-pay | Admitting: Gastroenterology

## 2018-08-13 DIAGNOSIS — K5909 Other constipation: Secondary | ICD-10-CM

## 2018-08-13 DIAGNOSIS — R1013 Epigastric pain: Secondary | ICD-10-CM

## 2018-08-13 DIAGNOSIS — R6881 Early satiety: Secondary | ICD-10-CM

## 2018-08-13 DIAGNOSIS — R140 Abdominal distension (gaseous): Secondary | ICD-10-CM

## 2018-08-13 DIAGNOSIS — R110 Nausea: Secondary | ICD-10-CM

## 2018-08-21 DIAGNOSIS — T1490XA Injury, unspecified, initial encounter: Secondary | ICD-10-CM | POA: Diagnosis not present

## 2018-08-21 DIAGNOSIS — F17210 Nicotine dependence, cigarettes, uncomplicated: Secondary | ICD-10-CM | POA: Diagnosis not present

## 2018-08-21 DIAGNOSIS — S90222A Contusion of left lesser toe(s) with damage to nail, initial encounter: Secondary | ICD-10-CM | POA: Diagnosis not present

## 2018-08-21 DIAGNOSIS — L02219 Cutaneous abscess of trunk, unspecified: Secondary | ICD-10-CM | POA: Diagnosis not present

## 2018-08-21 DIAGNOSIS — F339 Major depressive disorder, recurrent, unspecified: Secondary | ICD-10-CM | POA: Diagnosis not present

## 2018-08-28 DIAGNOSIS — I739 Peripheral vascular disease, unspecified: Secondary | ICD-10-CM | POA: Diagnosis not present

## 2018-08-28 DIAGNOSIS — L84 Corns and callosities: Secondary | ICD-10-CM | POA: Diagnosis not present

## 2018-08-28 DIAGNOSIS — B351 Tinea unguium: Secondary | ICD-10-CM | POA: Diagnosis not present

## 2018-08-29 DIAGNOSIS — Z79899 Other long term (current) drug therapy: Secondary | ICD-10-CM | POA: Diagnosis not present

## 2018-09-02 ENCOUNTER — Ambulatory Visit
Admission: RE | Admit: 2018-09-02 | Discharge: 2018-09-02 | Disposition: A | Discharge status: Home / Self Care | Payer: Medicare Other | Source: Ambulatory Visit | Attending: Gastroenterology | Admitting: Gastroenterology

## 2018-09-02 DIAGNOSIS — K5909 Other constipation: Secondary | ICD-10-CM | POA: Insufficient documentation

## 2018-09-02 DIAGNOSIS — R140 Abdominal distension (gaseous): Secondary | ICD-10-CM | POA: Insufficient documentation

## 2018-09-02 DIAGNOSIS — K5730 Diverticulosis of large intestine without perforation or abscess without bleeding: Secondary | ICD-10-CM | POA: Diagnosis not present

## 2018-09-02 DIAGNOSIS — R110 Nausea: Secondary | ICD-10-CM | POA: Diagnosis not present

## 2018-09-02 DIAGNOSIS — R6881 Early satiety: Secondary | ICD-10-CM | POA: Insufficient documentation

## 2018-09-02 DIAGNOSIS — R1013 Epigastric pain: Secondary | ICD-10-CM | POA: Insufficient documentation

## 2018-09-02 MED ORDER — IOHEXOL 300 MG/ML  SOLN
100.0000 mL | Freq: Once | INTRAMUSCULAR | Status: AC | PRN
  Administered 2018-09-02: 100 mL via INTRAVENOUS

## 2018-09-04 DIAGNOSIS — F411 Generalized anxiety disorder: Secondary | ICD-10-CM | POA: Diagnosis not present

## 2018-09-04 DIAGNOSIS — R42 Dizziness and giddiness: Secondary | ICD-10-CM | POA: Diagnosis not present

## 2018-09-04 DIAGNOSIS — R899 Unspecified abnormal finding in specimens from other organs, systems and tissues: Secondary | ICD-10-CM | POA: Diagnosis not present

## 2018-09-04 DIAGNOSIS — F339 Major depressive disorder, recurrent, unspecified: Secondary | ICD-10-CM | POA: Diagnosis not present

## 2018-09-04 DIAGNOSIS — F0390 Unspecified dementia without behavioral disturbance: Secondary | ICD-10-CM | POA: Diagnosis not present

## 2018-09-24 DIAGNOSIS — F331 Major depressive disorder, recurrent, moderate: Secondary | ICD-10-CM | POA: Diagnosis not present

## 2018-10-01 DIAGNOSIS — F331 Major depressive disorder, recurrent, moderate: Secondary | ICD-10-CM | POA: Diagnosis not present

## 2018-10-08 DIAGNOSIS — F411 Generalized anxiety disorder: Secondary | ICD-10-CM | POA: Diagnosis not present

## 2018-10-08 DIAGNOSIS — F331 Major depressive disorder, recurrent, moderate: Secondary | ICD-10-CM | POA: Diagnosis not present

## 2018-10-15 DIAGNOSIS — F411 Generalized anxiety disorder: Secondary | ICD-10-CM | POA: Diagnosis not present

## 2018-10-15 DIAGNOSIS — F331 Major depressive disorder, recurrent, moderate: Secondary | ICD-10-CM | POA: Diagnosis not present

## 2018-10-22 DIAGNOSIS — F331 Major depressive disorder, recurrent, moderate: Secondary | ICD-10-CM | POA: Diagnosis not present

## 2018-10-22 DIAGNOSIS — F411 Generalized anxiety disorder: Secondary | ICD-10-CM | POA: Diagnosis not present

## 2018-10-23 DIAGNOSIS — F339 Major depressive disorder, recurrent, unspecified: Secondary | ICD-10-CM | POA: Diagnosis not present

## 2018-10-23 DIAGNOSIS — R140 Abdominal distension (gaseous): Secondary | ICD-10-CM | POA: Diagnosis not present

## 2018-10-23 DIAGNOSIS — F411 Generalized anxiety disorder: Secondary | ICD-10-CM | POA: Diagnosis not present

## 2018-10-23 DIAGNOSIS — F17210 Nicotine dependence, cigarettes, uncomplicated: Secondary | ICD-10-CM | POA: Diagnosis not present

## 2018-10-23 DIAGNOSIS — F0390 Unspecified dementia without behavioral disturbance: Secondary | ICD-10-CM | POA: Diagnosis not present

## 2018-10-29 DIAGNOSIS — F331 Major depressive disorder, recurrent, moderate: Secondary | ICD-10-CM | POA: Diagnosis not present

## 2018-10-29 DIAGNOSIS — F411 Generalized anxiety disorder: Secondary | ICD-10-CM | POA: Diagnosis not present

## 2018-10-30 DIAGNOSIS — M549 Dorsalgia, unspecified: Secondary | ICD-10-CM | POA: Diagnosis not present

## 2018-10-30 DIAGNOSIS — F0390 Unspecified dementia without behavioral disturbance: Secondary | ICD-10-CM | POA: Diagnosis not present

## 2018-10-30 DIAGNOSIS — F339 Major depressive disorder, recurrent, unspecified: Secondary | ICD-10-CM | POA: Diagnosis not present

## 2018-10-30 DIAGNOSIS — R140 Abdominal distension (gaseous): Secondary | ICD-10-CM | POA: Diagnosis not present

## 2018-10-30 DIAGNOSIS — F411 Generalized anxiety disorder: Secondary | ICD-10-CM | POA: Diagnosis not present

## 2018-11-03 DIAGNOSIS — F3163 Bipolar disorder, current episode mixed, severe, without psychotic features: Secondary | ICD-10-CM | POA: Diagnosis not present

## 2018-11-05 DIAGNOSIS — F411 Generalized anxiety disorder: Secondary | ICD-10-CM | POA: Diagnosis not present

## 2018-11-05 DIAGNOSIS — F331 Major depressive disorder, recurrent, moderate: Secondary | ICD-10-CM | POA: Diagnosis not present

## 2018-11-10 ENCOUNTER — Other Ambulatory Visit: Payer: Self-pay

## 2018-11-12 DIAGNOSIS — F411 Generalized anxiety disorder: Secondary | ICD-10-CM | POA: Diagnosis not present

## 2018-11-12 DIAGNOSIS — F331 Major depressive disorder, recurrent, moderate: Secondary | ICD-10-CM | POA: Diagnosis not present

## 2018-11-13 DIAGNOSIS — M25511 Pain in right shoulder: Secondary | ICD-10-CM | POA: Diagnosis not present

## 2018-11-13 DIAGNOSIS — F17210 Nicotine dependence, cigarettes, uncomplicated: Secondary | ICD-10-CM | POA: Diagnosis not present

## 2018-11-13 DIAGNOSIS — F411 Generalized anxiety disorder: Secondary | ICD-10-CM | POA: Diagnosis not present

## 2018-11-13 DIAGNOSIS — F0390 Unspecified dementia without behavioral disturbance: Secondary | ICD-10-CM | POA: Diagnosis not present

## 2018-11-13 DIAGNOSIS — F339 Major depressive disorder, recurrent, unspecified: Secondary | ICD-10-CM | POA: Diagnosis not present

## 2018-11-13 DIAGNOSIS — R143 Flatulence: Secondary | ICD-10-CM | POA: Diagnosis not present

## 2018-11-13 DIAGNOSIS — K5909 Other constipation: Secondary | ICD-10-CM | POA: Diagnosis not present

## 2018-11-13 DIAGNOSIS — M25512 Pain in left shoulder: Secondary | ICD-10-CM | POA: Diagnosis not present

## 2018-11-19 DIAGNOSIS — F331 Major depressive disorder, recurrent, moderate: Secondary | ICD-10-CM | POA: Diagnosis not present

## 2018-11-19 DIAGNOSIS — F411 Generalized anxiety disorder: Secondary | ICD-10-CM | POA: Diagnosis not present

## 2018-11-26 DIAGNOSIS — F331 Major depressive disorder, recurrent, moderate: Secondary | ICD-10-CM | POA: Diagnosis not present

## 2018-11-26 DIAGNOSIS — F411 Generalized anxiety disorder: Secondary | ICD-10-CM | POA: Diagnosis not present

## 2018-11-27 ENCOUNTER — Other Ambulatory Visit
Admission: RE | Admit: 2018-11-27 | Discharge: 2018-11-27 | Disposition: A | Discharge status: Home / Self Care | Payer: Medicare Other | Source: Ambulatory Visit | Attending: Internal Medicine | Admitting: Internal Medicine

## 2018-11-27 ENCOUNTER — Other Ambulatory Visit: Payer: Self-pay

## 2018-11-27 DIAGNOSIS — L2081 Atopic neurodermatitis: Secondary | ICD-10-CM | POA: Diagnosis not present

## 2018-11-27 DIAGNOSIS — Z1159 Encounter for screening for other viral diseases: Secondary | ICD-10-CM | POA: Insufficient documentation

## 2018-11-27 DIAGNOSIS — F0390 Unspecified dementia without behavioral disturbance: Secondary | ICD-10-CM | POA: Diagnosis not present

## 2018-11-27 DIAGNOSIS — F411 Generalized anxiety disorder: Secondary | ICD-10-CM | POA: Diagnosis not present

## 2018-11-27 DIAGNOSIS — F603 Borderline personality disorder: Secondary | ICD-10-CM | POA: Diagnosis not present

## 2018-11-27 DIAGNOSIS — M549 Dorsalgia, unspecified: Secondary | ICD-10-CM | POA: Diagnosis not present

## 2018-11-27 DIAGNOSIS — R140 Abdominal distension (gaseous): Secondary | ICD-10-CM | POA: Diagnosis not present

## 2018-11-28 LAB — NOVEL CORONAVIRUS, NAA (HOSP ORDER, SEND-OUT TO REF LAB; TAT 18-24 HRS): SARS-CoV-2, NAA: NOT DETECTED

## 2018-12-02 DIAGNOSIS — F331 Major depressive disorder, recurrent, moderate: Secondary | ICD-10-CM | POA: Diagnosis not present

## 2018-12-02 DIAGNOSIS — F411 Generalized anxiety disorder: Secondary | ICD-10-CM | POA: Diagnosis not present

## 2018-12-03 ENCOUNTER — Ambulatory Visit: Payer: Medicare Other | Admitting: Family

## 2018-12-03 ENCOUNTER — Ambulatory Visit
Admission: RE | Admit: 2018-12-03 | Discharge: 2018-12-03 | Disposition: A | Discharge status: Home / Self Care | Payer: Medicare Other | Attending: Internal Medicine | Admitting: Internal Medicine

## 2018-12-03 ENCOUNTER — Encounter: Payer: Self-pay | Admitting: *Deleted

## 2018-12-03 ENCOUNTER — Encounter
Admission: RE | Disposition: A | Discharge status: Home / Self Care | Payer: Self-pay | Source: Home / Self Care | Attending: Internal Medicine

## 2018-12-03 ENCOUNTER — Other Ambulatory Visit: Payer: Self-pay

## 2018-12-03 DIAGNOSIS — K2970 Gastritis, unspecified, without bleeding: Secondary | ICD-10-CM | POA: Diagnosis not present

## 2018-12-03 DIAGNOSIS — K319 Disease of stomach and duodenum, unspecified: Secondary | ICD-10-CM | POA: Diagnosis not present

## 2018-12-03 DIAGNOSIS — G2581 Restless legs syndrome: Secondary | ICD-10-CM | POA: Insufficient documentation

## 2018-12-03 DIAGNOSIS — F319 Bipolar disorder, unspecified: Secondary | ICD-10-CM | POA: Insufficient documentation

## 2018-12-03 DIAGNOSIS — Z538 Procedure and treatment not carried out for other reasons: Secondary | ICD-10-CM | POA: Diagnosis not present

## 2018-12-03 DIAGNOSIS — Z79899 Other long term (current) drug therapy: Secondary | ICD-10-CM | POA: Insufficient documentation

## 2018-12-03 DIAGNOSIS — K5730 Diverticulosis of large intestine without perforation or abscess without bleeding: Secondary | ICD-10-CM | POA: Insufficient documentation

## 2018-12-03 DIAGNOSIS — K640 First degree hemorrhoids: Secondary | ICD-10-CM | POA: Insufficient documentation

## 2018-12-03 DIAGNOSIS — N189 Chronic kidney disease, unspecified: Secondary | ICD-10-CM | POA: Insufficient documentation

## 2018-12-03 DIAGNOSIS — J449 Chronic obstructive pulmonary disease, unspecified: Secondary | ICD-10-CM | POA: Diagnosis not present

## 2018-12-03 DIAGNOSIS — K222 Esophageal obstruction: Secondary | ICD-10-CM | POA: Insufficient documentation

## 2018-12-03 DIAGNOSIS — F419 Anxiety disorder, unspecified: Secondary | ICD-10-CM | POA: Diagnosis not present

## 2018-12-03 DIAGNOSIS — F0390 Unspecified dementia without behavioral disturbance: Secondary | ICD-10-CM | POA: Insufficient documentation

## 2018-12-03 DIAGNOSIS — K2960 Other gastritis without bleeding: Secondary | ICD-10-CM | POA: Diagnosis not present

## 2018-12-03 DIAGNOSIS — Z8673 Personal history of transient ischemic attack (TIA), and cerebral infarction without residual deficits: Secondary | ICD-10-CM | POA: Insufficient documentation

## 2018-12-03 DIAGNOSIS — R6881 Early satiety: Secondary | ICD-10-CM | POA: Insufficient documentation

## 2018-12-03 DIAGNOSIS — I129 Hypertensive chronic kidney disease with stage 1 through stage 4 chronic kidney disease, or unspecified chronic kidney disease: Secondary | ICD-10-CM | POA: Insufficient documentation

## 2018-12-03 DIAGNOSIS — F17200 Nicotine dependence, unspecified, uncomplicated: Secondary | ICD-10-CM | POA: Diagnosis not present

## 2018-12-03 DIAGNOSIS — R110 Nausea: Secondary | ICD-10-CM | POA: Insufficient documentation

## 2018-12-03 DIAGNOSIS — K3189 Other diseases of stomach and duodenum: Secondary | ICD-10-CM | POA: Diagnosis not present

## 2018-12-03 DIAGNOSIS — R194 Change in bowel habit: Secondary | ICD-10-CM | POA: Insufficient documentation

## 2018-12-03 DIAGNOSIS — R1013 Epigastric pain: Secondary | ICD-10-CM | POA: Diagnosis not present

## 2018-12-03 HISTORY — PX: ESOPHAGOGASTRODUODENOSCOPY (EGD) WITH PROPOFOL: SHX5813

## 2018-12-03 HISTORY — PX: COLONOSCOPY WITH PROPOFOL: SHX5780

## 2018-12-03 SURGERY — COLONOSCOPY WITH PROPOFOL
Anesthesia: General

## 2018-12-03 MED ORDER — PROPOFOL 10 MG/ML IV BOLUS
INTRAVENOUS | Status: DC | PRN
  Administered 2018-12-03: 20 mg via INTRAVENOUS
  Administered 2018-12-03: 50 mg via INTRAVENOUS

## 2018-12-03 MED ORDER — SODIUM CHLORIDE 0.9 % IV SOLN
INTRAVENOUS | Status: DC
  Administered 2018-12-03: 09:00:00 via INTRAVENOUS

## 2018-12-03 MED ORDER — PROPOFOL 500 MG/50ML IV EMUL
INTRAVENOUS | Status: AC
  Filled 2018-12-03: qty 50

## 2018-12-03 MED ORDER — PROPOFOL 10 MG/ML IV BOLUS
INTRAVENOUS | Status: AC
  Filled 2018-12-03: qty 20

## 2018-12-03 MED ORDER — MIDAZOLAM HCL 2 MG/2ML IJ SOLN
INTRAMUSCULAR | Status: AC
  Filled 2018-12-03: qty 2

## 2018-12-03 MED ORDER — MIDAZOLAM HCL 5 MG/5ML IJ SOLN
INTRAMUSCULAR | Status: DC | PRN
  Administered 2018-12-03 (×2): 1 mg via INTRAVENOUS

## 2018-12-03 MED ORDER — PROPOFOL 500 MG/50ML IV EMUL
INTRAVENOUS | Status: DC | PRN
  Administered 2018-12-03: 160 ug/kg/min via INTRAVENOUS

## 2018-12-03 NOTE — Anesthesia Post-op Follow-up Note (Signed)
Anesthesia QCDR form completed.        

## 2018-12-03 NOTE — Anesthesia Preprocedure Evaluation (Signed)
Anesthesia Evaluation  Patient identified by MRN, date of birth, ID band Patient awake    Reviewed: Allergy & Precautions, NPO status , Patient's Chart, lab work & pertinent test results  History of Anesthesia Complications Negative for: history of anesthetic complications  Airway Mallampati: II  TM Distance: >3 FB Neck ROM: Full    Dental  (+) Edentulous Upper, Edentulous Lower   Pulmonary neg sleep apnea, COPD, Current Smoker,    breath sounds clear to auscultation- rhonchi (-) wheezing      Cardiovascular hypertension, (-) CAD, (-) Past MI, (-) Cardiac Stents and (-) CABG  Rhythm:Regular Rate:Normal - Systolic murmurs and - Diastolic murmurs    Neuro/Psych Seizures -,  PSYCHIATRIC DISORDERS Anxiety Bipolar Disorder Dementia TIA   GI/Hepatic negative GI ROS, Neg liver ROS,   Endo/Other  negative endocrine ROSneg diabetes  Renal/GU Renal InsufficiencyRenal disease     Musculoskeletal  (+) Arthritis ,   Abdominal (+) - obese,   Peds  Hematology negative hematology ROS (+)   Anesthesia Other Findings Past Medical History: No date: Anxiety No date: Chronic kidney disease No date: Chronic pain No date: Discoid lupus No date: Diverticulitis No date: Gastritis No date: Lupus (systemic lupus erythematosus) ([facility]) No date: Restless leg No date: Seizures ([place])   Reproductive/Obstetrics                             Anesthesia Physical Anesthesia Plan  ASA: III  Anesthesia Plan: General   Post-op Pain Management:    Induction: Intravenous  PONV Risk Score and Plan: 1 and Propofol infusion  Airway Management Planned: Natural Airway  Additional Equipment:   Intra-op Plan:   Post-operative Plan:   Informed Consent: I have reviewed the patients History and Physical, chart, labs and discussed the procedure including the risks, benefits and alternatives for the proposed anesthesia with  the patient or authorized representative who has indicated his/her understanding and acceptance.     Dental advisory given  Plan Discussed with: CRNA and Anesthesiologist  Anesthesia Plan Comments:         Anesthesia Quick Evaluation

## 2018-12-03 NOTE — Anesthesia Procedure Notes (Signed)
Date/Time: 12/03/2018 10:16 AM Performed by: Gentry Fitz, CRNA Pre-anesthesia Checklist: Patient identified, Emergency Drugs available, Suction available and Patient being monitored Patient Re-evaluated:Patient Re-evaluated prior to induction Oxygen Delivery Method: Nasal cannula Preoxygenation: Pre-oxygenation with 100% oxygen Induction Type: IV induction

## 2018-12-03 NOTE — Transfer of Care (Signed)
Immediate Anesthesia Transfer of Care Note  Patient: Paula Frank  Procedure(s) Performed: COLONOSCOPY WITH PROPOFOL (N/A ) ESOPHAGOGASTRODUODENOSCOPY (EGD) WITH PROPOFOL (N/A )  Patient Location: PACU  Anesthesia Type:General  Level of Consciousness: sedated  Airway & Oxygen Therapy: Patient Spontanous Breathing and Patient connected to nasal cannula oxygen  Post-op Assessment: Report given to RN and Post -op Vital signs reviewed and stable  Post vital signs: Reviewed and stable  Last Vitals:  Vitals Value Taken Time  BP    Temp    Pulse    Resp    SpO2      Last Pain:  Vitals:   12/03/18 0859  TempSrc: Tympanic  PainSc: 0-No pain         Complications: No apparent anesthesia complications

## 2018-12-03 NOTE — Op Note (Signed)
[facility] Gastroenterology Patient Name: Paula Frank Procedure Date: 12/03/2018 10:11 AM MRN: [id number] Account #: [id number] Date of Birth: 06-Nov-1940 Admit Type: Outpatient Age: 78 Room: [facility] ENDO ROOM 4 Gender: Female Note Status: Finalized Procedure:            Colonoscopy Indications:          Change in bowel habits Providers:            Benay Pike. Alice Reichert MD, MD Referring MD:         No Local Md, MD (Referring MD) Medicines:            Propofol per Anesthesia Complications:        No immediate complications. Procedure:            Pre-Anesthesia Assessment:                       - The risks and benefits of the procedure and the                        sedation options and risks were discussed with the                        patient. All questions were answered and informed                        consent was obtained.                       - Patient identification and proposed procedure were                        verified prior to the procedure by the nurse. The                        procedure was verified in the procedure room.                       - ASA Grade Assessment: III - A patient with severe                        systemic disease.                       - After reviewing the risks and benefits, the patient                        was deemed in satisfactory condition to undergo the                        procedure.                       After obtaining informed consent, the colonoscope was                        passed under direct vision. Throughout the procedure,                        the patient's blood pressure, pulse, and oxygen  saturations were monitored continuously. The                        Colonoscope was introduced through the anus and                        advanced to the the sigmoid colon. The colonoscopy was                        unusually difficult due to inadequate bowel prep.   Successful completion of the procedure was aided by                        lavage. The patient tolerated the procedure well. The                        quality of the bowel preparation was unsatisfactory. Findings:      The digital rectal exam findings include decreased sphincter tone.       Pertinent negatives include no palpable rectal lesions.      Copious quantities of solid stool was found in the mid sigmoid colon,       precluding visualization. Scope was withdrawn and the procedure was       terminated at this point.      Non-bleeding internal hemorrhoids were found during retroflexion. The       hemorrhoids were Grade I (internal hemorrhoids that do not prolapse). Impression:           - Preparation of the colon was unsatisfactory.                       - Decreased sphincter tone found on digital rectal exam.                       - Stool in the mid sigmoid colon.                       - Non-bleeding internal hemorrhoids.                       - No specimens collected.                       - The procedure was aborted due to inadequate bowel                        prep.                       - Mild diverticulosis in the distal sigmoid colon.                        There was no evidence of diverticular bleeding. Recommendation:       - Reschedule colonoscopy with 2 day clear liquids and                        several weeks of laxative therapy beforehand.                       - Return to physician assistant in 4 weeks.                       -  The findings and recommendations were discussed with                        the patient. Procedure Code(s):    --- Professional ---                       [phone], 40, Colonoscopy, flexible; diagnostic, including                        collection of specimen(s) by brushing or washing, when                        performed (separate procedure) Diagnosis Code(s):    --- Professional ---                       K57.30, Diverticulosis of large  intestine without                        perforation or abscess without bleeding                       R19.4, Change in bowel habit                       Z53.8, Procedure and treatment not carried out for                        other reasons                       K62.89, Other specified diseases of anus and rectum                       K64.0, First degree hemorrhoids CPT copyright 2019 American Medical Association. All rights reserved. The codes documented in this report are preliminary and upon coder review may  be revised to meet current compliance requirements. Efrain Sella MD, MD 12/03/2018 10:33:12 AM This report has been signed electronically. Number of Addenda: 0 Note Initiated On: 12/03/2018 10:11 AM Total Procedure Duration: 0 hours 3 minutes 19 seconds       [facility]

## 2018-12-03 NOTE — Interval H&P Note (Signed)
History and Physical Interval Note:  12/03/2018 10:00 AM  Paula Frank  has presented today for surgery, with the diagnosis of CHRONIC CONSTIPATION ABD DISTENSION ALCOHOL, TOBACCO ABUSE EPIG PAIN.  The various methods of treatment have been discussed with the patient and family. After consideration of risks, benefits and other options for treatment, the patient has consented to  Procedure(s): COLONOSCOPY WITH PROPOFOL (N/A) ESOPHAGOGASTRODUODENOSCOPY (EGD) WITH PROPOFOL (N/A) as a surgical intervention.  The patient's history has been reviewed, patient examined, no change in status, stable for surgery.  I have reviewed the patient's chart and labs.  Questions were answered to the patient's satisfaction.     [place], [place]

## 2018-12-03 NOTE — Op Note (Signed)
[facility] Gastroenterology Patient Name: Paula Frank Procedure Date: 12/03/2018 10:12 AM MRN: [id number] Account #: [id number] Date of Birth: Jun 29, 1940 Admit Type: Outpatient Age: 78 Room: [facility] ENDO ROOM 4 Gender: Female Note Status: Finalized Procedure:            Upper GI endoscopy Indications:          Epigastric abdominal pain, Suspected esophageal reflux,                        Early satiety, Nausea Providers:            Benay Pike. Alice Reichert MD, MD Referring MD:         No Local Md, MD (Referring MD) Medicines:            Propofol per Anesthesia Complications:        No immediate complications. Procedure:            Pre-Anesthesia Assessment:                       - The risks and benefits of the procedure and the                        sedation options and risks were discussed with the                        patient. All questions were answered and informed                        consent was obtained.                       - Patient identification and proposed procedure were                        verified prior to the procedure by the nurse. The                        procedure was verified in the procedure room.                       - ASA Grade Assessment: III - A patient with severe                        systemic disease.                       - After reviewing the risks and benefits, the patient                        was deemed in satisfactory condition to undergo the                        procedure.                       After obtaining informed consent, the endoscope was                        passed under direct vision. Throughout the procedure,  the patient's blood pressure, pulse, and oxygen                        saturations were monitored continuously. The Endoscope                        was introduced through the mouth, and advanced to the                        third part of duodenum. The upper GI endoscopy was                        accomplished without difficulty. The patient tolerated                        the procedure well. Findings:      A non-obstructing Schatzki ring was found in the distal esophagus.      Localized minimal inflammation characterized by erythema was found in       the gastric antrum. Biopsies were taken with a cold forceps for       Helicobacter pylori testing.      The cardia and gastric fundus were normal on retroflexion.      The examined duodenum was normal.      The exam was otherwise without abnormality. Impression:           - Non-obstructing Schatzki ring.                       - Gastritis. Biopsied.                       - Normal examined duodenum.                       - The examination was otherwise normal. Recommendation:       - Await pathology results.                       - Proceed with colonoscopy Procedure Code(s):    --- Professional ---                       [phone], Esophagogastroduodenoscopy, flexible, transoral;                        with biopsy, single or multiple Diagnosis Code(s):    --- Professional ---                       R11.0, Nausea                       R68.81, Early satiety                       R10.13, Epigastric pain                       K29.70, Gastritis, unspecified, without bleeding                       K22.2, Esophageal obstruction CPT copyright 2019 American Medical Association. All rights reserved. The codes documented in this report are preliminary and upon coder review may  be revised to meet  current compliance requirements. Efrain Sella MD, MD 12/03/2018 10:22:28 AM This report has been signed electronically. Number of Addenda: 0 Note Initiated On: 12/03/2018 10:12 AM      [facility]

## 2018-12-03 NOTE — Anesthesia Postprocedure Evaluation (Signed)
Anesthesia Post Note  Patient: KIAHNA BANGHART  Procedure(s) Performed: COLONOSCOPY WITH PROPOFOL (N/A ) ESOPHAGOGASTRODUODENOSCOPY (EGD) WITH PROPOFOL (N/A )  Patient location during evaluation: Endoscopy Anesthesia Type: General Level of consciousness: awake and alert and oriented Pain management: pain level controlled Vital Signs Assessment: post-procedure vital signs reviewed and stable Respiratory status: spontaneous breathing, nonlabored ventilation and respiratory function stable Cardiovascular status: blood pressure returned to baseline and stable Postop Assessment: no signs of nausea or vomiting Anesthetic complications: no     Last Vitals:  Vitals:   12/03/18 1101 12/03/18 1111  BP: 135/75 140/74  Pulse: (!) 42 91  Resp: 20 (!) 23  Temp:    SpO2: 96% 92%    Last Pain:  Vitals:   12/03/18 1111  TempSrc:   PainSc: 0-No pain                 Shanikia Kernodle

## 2018-12-03 NOTE — H&P (Signed)
Outpatient short stay form Pre-procedure 12/03/2018 9:56 AM Nataline Basara K. Alice Reichert, M.D.  Primary Physician: N/A  Reason for visit: Epigastric pain, early satiety, nausea, change in bowel habits  History of present illness: Patient 78 year old female with history of alcohol abuse, bipolar disorder and seizure disorder presents for epigastric pain, early satiety and nausea with change in bowel habits mainly constipation.  Recent CT scan revealed evidence of colonic diverticulosis but no significant liver, hepatobiliary abnormalities.    Current Facility-Administered Medications:  .  0.9 %  sodium chloride infusion, , Intravenous, Continuous, [place], Benay Pike, MD, Last Rate: 20 mL/hr at 12/03/18 5885  Facility-Administered Medications Ordered in Other Encounters:  .  midazolam (VERSED) 5 MG/5ML injection, , , Anesthesia Intra-op, Evans, Darrell B, CRNA, 1 mg at 12/03/18 0277  Medications Prior to Admission  Medication Sig Dispense Refill Last Dose  . doxepin (SINEQUAN) 25 MG capsule Take 25 mg by mouth at bedtime.   12/02/2018 at Unknown time  . fluticasone (FLONASE) 50 MCG/ACT nasal spray Place 2 sprays into both nostrils daily. Use for 4-6 weeks then stop and use seasonally or as needed. 16 g 3 12/02/2018 at Unknown time  . ibuprofen (ADVIL) 400 MG tablet Take 400 mg by mouth every 6 (six) hours as needed.   12/03/2018 at Unknown time  . lactulose (CEPHULAC) 20 g packet Take 1 packet (20 g total) by mouth 3 (three) times daily. 30 each 0 12/02/2018 at Unknown time  . loratadine (CLARITIN) 10 MG tablet Take 1 tablet (10 mg total) by mouth daily. 30 tablet 11 12/02/2018 at Unknown time  . ondansetron (ZOFRAN) 4 MG tablet Take 1 tablet (4 mg total) by mouth every 8 (eight) hours as needed for nausea or vomiting. 30 tablet 2 12/03/2018 at Unknown time  . QUEtiapine (SEROQUEL) 400 MG tablet Take 400 mg by mouth at bedtime.    12/02/2018 at Unknown time  . rOPINIRole (REQUIP) 1 MG tablet TAKE 1 TABLET EVERY  MORNING AND 2 TABLETS EVERY NIGHT 90 tablet 2 12/02/2018 at Unknown time  . aspirin EC 81 MG EC tablet Take 1 tablet (81 mg total) by mouth daily. (Patient not taking: Reported on 12/03/2018) 30 tablet 0 Not Taking at Unknown time  . doxycycline (VIBRA-TABS) 100 MG tablet Take 1 tablet (100 mg total) by mouth every 12 (twelve) hours. (Patient not taking: Reported on 12/03/2018) 10 tablet 0 Not Taking at Unknown time  . gabapentin (NEURONTIN) 100 MG capsule Take 100 mg by mouth 3 (three) times daily.   Not Taking at Unknown time     Allergies  Allergen Reactions  . Iodinated Diagnostic Agents     Other reaction(s): Unknown Other reaction(s): Other (See Comments) Other reaction(s): Unknown  . Sulfa Antibiotics Rash    Other reaction(s): Unknown Other reaction(s): Unknown   . Amoxicillin Other (See Comments)  . Ioxaglate     Other reaction(s): Unknown Other reaction(s): Unknown Other reaction(s): Unknown   . Levaquin [Levofloxacin In D5w]   . Moxifloxacin Other (See Comments)    Other reaction(s): Unknown Other reaction(s): Unknown  . Moxifloxacin Hcl In Nacl     Other reaction(s): Unknown  . Penicillin G     Other reaction(s): Other (See Comments) Other reaction(s): Unknown  . Erythromycin Rash    Canker sore  . Levofloxacin Rash    Other reaction(s): Unknown Other reaction(s): Unknown  . Sulfacetamide Sodium Rash    Other reaction(s): Unknown     Past Medical History:  Diagnosis Date  .  Anxiety   . Chronic kidney disease   . Chronic pain   . Discoid lupus   . Diverticulitis   . Gastritis   . Lupus (systemic lupus erythematosus) ([place])   . Restless leg   . Seizures ([place])     Review of systems:  Otherwise negative.    Physical Exam  Gen: Alert, oriented. Appears stated age.  HEENT: [place]/AT. PERRLA. Lungs: CTA, no wheezes. CV: RR nl S1, S2. Abd: soft, benign, no masses. BS+ Ext: No edema. Pulses 2+    Planned procedures: Proceed with EGD and colonoscopy. The  patient understands the nature of the planned procedure, indications, risks, alternatives and potential complications including but not limited to bleeding, infection, perforation, damage to internal organs and possible oversedation/side effects from anesthesia. The patient agrees and gives consent to proceed.  Please refer to procedure notes for findings, recommendations and patient disposition/instructions.     Toshua Honsinger K. Alice Reichert, M.D. Gastroenterology 12/03/2018  9:56 AM

## 2018-12-04 ENCOUNTER — Encounter: Payer: Self-pay | Admitting: Internal Medicine

## 2018-12-04 LAB — SURGICAL PATHOLOGY

## 2018-12-10 DIAGNOSIS — F411 Generalized anxiety disorder: Secondary | ICD-10-CM | POA: Diagnosis not present

## 2018-12-10 DIAGNOSIS — F331 Major depressive disorder, recurrent, moderate: Secondary | ICD-10-CM | POA: Diagnosis not present

## 2018-12-11 DIAGNOSIS — F3163 Bipolar disorder, current episode mixed, severe, without psychotic features: Secondary | ICD-10-CM | POA: Diagnosis not present

## 2018-12-11 DIAGNOSIS — F411 Generalized anxiety disorder: Secondary | ICD-10-CM | POA: Diagnosis not present

## 2018-12-11 DIAGNOSIS — F339 Major depressive disorder, recurrent, unspecified: Secondary | ICD-10-CM | POA: Diagnosis not present

## 2018-12-11 DIAGNOSIS — F0390 Unspecified dementia without behavioral disturbance: Secondary | ICD-10-CM | POA: Diagnosis not present

## 2018-12-11 DIAGNOSIS — G2581 Restless legs syndrome: Secondary | ICD-10-CM | POA: Diagnosis not present

## 2018-12-12 DIAGNOSIS — F339 Major depressive disorder, recurrent, unspecified: Secondary | ICD-10-CM | POA: Diagnosis not present

## 2018-12-17 DIAGNOSIS — F331 Major depressive disorder, recurrent, moderate: Secondary | ICD-10-CM | POA: Diagnosis not present

## 2018-12-17 DIAGNOSIS — F411 Generalized anxiety disorder: Secondary | ICD-10-CM | POA: Diagnosis not present

## 2018-12-18 DIAGNOSIS — F314 Bipolar disorder, current episode depressed, severe, without psychotic features: Secondary | ICD-10-CM | POA: Diagnosis not present

## 2018-12-24 DIAGNOSIS — F411 Generalized anxiety disorder: Secondary | ICD-10-CM | POA: Diagnosis not present

## 2018-12-24 DIAGNOSIS — F331 Major depressive disorder, recurrent, moderate: Secondary | ICD-10-CM | POA: Diagnosis not present

## 2018-12-30 DIAGNOSIS — I739 Peripheral vascular disease, unspecified: Secondary | ICD-10-CM | POA: Diagnosis not present

## 2018-12-30 DIAGNOSIS — B351 Tinea unguium: Secondary | ICD-10-CM | POA: Diagnosis not present

## 2018-12-30 DIAGNOSIS — Z79899 Other long term (current) drug therapy: Secondary | ICD-10-CM | POA: Diagnosis not present

## 2018-12-31 DIAGNOSIS — F411 Generalized anxiety disorder: Secondary | ICD-10-CM | POA: Diagnosis not present

## 2018-12-31 DIAGNOSIS — F331 Major depressive disorder, recurrent, moderate: Secondary | ICD-10-CM | POA: Diagnosis not present

## 2019-01-07 DIAGNOSIS — F3163 Bipolar disorder, current episode mixed, severe, without psychotic features: Secondary | ICD-10-CM | POA: Diagnosis not present

## 2019-01-07 DIAGNOSIS — F331 Major depressive disorder, recurrent, moderate: Secondary | ICD-10-CM | POA: Diagnosis not present

## 2019-01-07 DIAGNOSIS — F411 Generalized anxiety disorder: Secondary | ICD-10-CM | POA: Diagnosis not present

## 2019-01-08 DIAGNOSIS — L2081 Atopic neurodermatitis: Secondary | ICD-10-CM | POA: Diagnosis not present

## 2019-01-08 DIAGNOSIS — R140 Abdominal distension (gaseous): Secondary | ICD-10-CM | POA: Diagnosis not present

## 2019-01-08 DIAGNOSIS — G2581 Restless legs syndrome: Secondary | ICD-10-CM | POA: Diagnosis not present

## 2019-01-08 DIAGNOSIS — F314 Bipolar disorder, current episode depressed, severe, without psychotic features: Secondary | ICD-10-CM | POA: Diagnosis not present

## 2019-01-14 DIAGNOSIS — F411 Generalized anxiety disorder: Secondary | ICD-10-CM | POA: Diagnosis not present

## 2019-01-14 DIAGNOSIS — F331 Major depressive disorder, recurrent, moderate: Secondary | ICD-10-CM | POA: Diagnosis not present

## 2019-01-20 DIAGNOSIS — F3181 Bipolar II disorder: Secondary | ICD-10-CM | POA: Diagnosis not present

## 2019-01-21 DIAGNOSIS — F411 Generalized anxiety disorder: Secondary | ICD-10-CM | POA: Diagnosis not present

## 2019-01-21 DIAGNOSIS — F331 Major depressive disorder, recurrent, moderate: Secondary | ICD-10-CM | POA: Diagnosis not present

## 2019-01-22 DIAGNOSIS — K2970 Gastritis, unspecified, without bleeding: Secondary | ICD-10-CM | POA: Diagnosis not present

## 2019-01-22 DIAGNOSIS — F0390 Unspecified dementia without behavioral disturbance: Secondary | ICD-10-CM | POA: Diagnosis not present

## 2019-01-22 DIAGNOSIS — J209 Acute bronchitis, unspecified: Secondary | ICD-10-CM | POA: Diagnosis not present

## 2019-01-22 DIAGNOSIS — F3181 Bipolar II disorder: Secondary | ICD-10-CM | POA: Diagnosis not present

## 2019-01-22 DIAGNOSIS — R140 Abdominal distension (gaseous): Secondary | ICD-10-CM | POA: Diagnosis not present

## 2019-01-22 DIAGNOSIS — F339 Major depressive disorder, recurrent, unspecified: Secondary | ICD-10-CM | POA: Diagnosis not present

## 2019-01-28 DIAGNOSIS — F331 Major depressive disorder, recurrent, moderate: Secondary | ICD-10-CM | POA: Diagnosis not present

## 2019-01-28 DIAGNOSIS — F411 Generalized anxiety disorder: Secondary | ICD-10-CM | POA: Diagnosis not present

## 2019-02-04 DIAGNOSIS — F411 Generalized anxiety disorder: Secondary | ICD-10-CM | POA: Diagnosis not present

## 2019-02-04 DIAGNOSIS — F331 Major depressive disorder, recurrent, moderate: Secondary | ICD-10-CM | POA: Diagnosis not present

## 2019-02-11 DIAGNOSIS — F314 Bipolar disorder, current episode depressed, severe, without psychotic features: Secondary | ICD-10-CM | POA: Diagnosis not present

## 2019-02-17 DIAGNOSIS — R2681 Unsteadiness on feet: Secondary | ICD-10-CM | POA: Diagnosis not present

## 2019-02-17 DIAGNOSIS — R296 Repeated falls: Secondary | ICD-10-CM | POA: Diagnosis not present

## 2019-02-17 DIAGNOSIS — M6281 Muscle weakness (generalized): Secondary | ICD-10-CM | POA: Diagnosis not present

## 2019-02-17 DIAGNOSIS — R2689 Other abnormalities of gait and mobility: Secondary | ICD-10-CM | POA: Diagnosis not present

## 2019-02-18 DIAGNOSIS — M6281 Muscle weakness (generalized): Secondary | ICD-10-CM | POA: Diagnosis not present

## 2019-02-18 DIAGNOSIS — F411 Generalized anxiety disorder: Secondary | ICD-10-CM | POA: Diagnosis not present

## 2019-02-18 DIAGNOSIS — F331 Major depressive disorder, recurrent, moderate: Secondary | ICD-10-CM | POA: Diagnosis not present

## 2019-02-18 DIAGNOSIS — R296 Repeated falls: Secondary | ICD-10-CM | POA: Diagnosis not present

## 2019-02-18 DIAGNOSIS — R2681 Unsteadiness on feet: Secondary | ICD-10-CM | POA: Diagnosis not present

## 2019-02-18 DIAGNOSIS — R2689 Other abnormalities of gait and mobility: Secondary | ICD-10-CM | POA: Diagnosis not present

## 2019-02-19 DIAGNOSIS — R2681 Unsteadiness on feet: Secondary | ICD-10-CM | POA: Diagnosis not present

## 2019-02-19 DIAGNOSIS — R2689 Other abnormalities of gait and mobility: Secondary | ICD-10-CM | POA: Diagnosis not present

## 2019-02-19 DIAGNOSIS — R296 Repeated falls: Secondary | ICD-10-CM | POA: Diagnosis not present

## 2019-02-19 DIAGNOSIS — M6281 Muscle weakness (generalized): Secondary | ICD-10-CM | POA: Diagnosis not present

## 2019-02-20 DIAGNOSIS — R2681 Unsteadiness on feet: Secondary | ICD-10-CM | POA: Diagnosis not present

## 2019-02-20 DIAGNOSIS — R296 Repeated falls: Secondary | ICD-10-CM | POA: Diagnosis not present

## 2019-02-20 DIAGNOSIS — M6281 Muscle weakness (generalized): Secondary | ICD-10-CM | POA: Diagnosis not present

## 2019-02-20 DIAGNOSIS — R2689 Other abnormalities of gait and mobility: Secondary | ICD-10-CM | POA: Diagnosis not present

## 2019-02-23 DIAGNOSIS — R296 Repeated falls: Secondary | ICD-10-CM | POA: Diagnosis not present

## 2019-02-23 DIAGNOSIS — R2689 Other abnormalities of gait and mobility: Secondary | ICD-10-CM | POA: Diagnosis not present

## 2019-02-23 DIAGNOSIS — M6281 Muscle weakness (generalized): Secondary | ICD-10-CM | POA: Diagnosis not present

## 2019-02-23 DIAGNOSIS — R2681 Unsteadiness on feet: Secondary | ICD-10-CM | POA: Diagnosis not present

## 2019-02-23 DIAGNOSIS — F3181 Bipolar II disorder: Secondary | ICD-10-CM | POA: Diagnosis not present

## 2019-02-24 DIAGNOSIS — R2681 Unsteadiness on feet: Secondary | ICD-10-CM | POA: Diagnosis not present

## 2019-02-24 DIAGNOSIS — M6281 Muscle weakness (generalized): Secondary | ICD-10-CM | POA: Diagnosis not present

## 2019-02-24 DIAGNOSIS — R296 Repeated falls: Secondary | ICD-10-CM | POA: Diagnosis not present

## 2019-02-24 DIAGNOSIS — R2689 Other abnormalities of gait and mobility: Secondary | ICD-10-CM | POA: Diagnosis not present

## 2019-02-25 DIAGNOSIS — F411 Generalized anxiety disorder: Secondary | ICD-10-CM | POA: Diagnosis not present

## 2019-02-25 DIAGNOSIS — M6281 Muscle weakness (generalized): Secondary | ICD-10-CM | POA: Diagnosis not present

## 2019-02-25 DIAGNOSIS — R2681 Unsteadiness on feet: Secondary | ICD-10-CM | POA: Diagnosis not present

## 2019-02-25 DIAGNOSIS — R2689 Other abnormalities of gait and mobility: Secondary | ICD-10-CM | POA: Diagnosis not present

## 2019-02-25 DIAGNOSIS — R296 Repeated falls: Secondary | ICD-10-CM | POA: Diagnosis not present

## 2019-02-25 DIAGNOSIS — F331 Major depressive disorder, recurrent, moderate: Secondary | ICD-10-CM | POA: Diagnosis not present

## 2019-02-26 DIAGNOSIS — R2689 Other abnormalities of gait and mobility: Secondary | ICD-10-CM | POA: Diagnosis not present

## 2019-02-26 DIAGNOSIS — R296 Repeated falls: Secondary | ICD-10-CM | POA: Diagnosis not present

## 2019-02-26 DIAGNOSIS — R2681 Unsteadiness on feet: Secondary | ICD-10-CM | POA: Diagnosis not present

## 2019-02-26 DIAGNOSIS — M6281 Muscle weakness (generalized): Secondary | ICD-10-CM | POA: Diagnosis not present

## 2019-02-27 DIAGNOSIS — R2681 Unsteadiness on feet: Secondary | ICD-10-CM | POA: Diagnosis not present

## 2019-02-27 DIAGNOSIS — R296 Repeated falls: Secondary | ICD-10-CM | POA: Diagnosis not present

## 2019-02-27 DIAGNOSIS — R2689 Other abnormalities of gait and mobility: Secondary | ICD-10-CM | POA: Diagnosis not present

## 2019-02-27 DIAGNOSIS — M6281 Muscle weakness (generalized): Secondary | ICD-10-CM | POA: Diagnosis not present

## 2019-03-03 DIAGNOSIS — M6281 Muscle weakness (generalized): Secondary | ICD-10-CM | POA: Diagnosis not present

## 2019-03-03 DIAGNOSIS — R296 Repeated falls: Secondary | ICD-10-CM | POA: Diagnosis not present

## 2019-03-03 DIAGNOSIS — R2689 Other abnormalities of gait and mobility: Secondary | ICD-10-CM | POA: Diagnosis not present

## 2019-03-03 DIAGNOSIS — R2681 Unsteadiness on feet: Secondary | ICD-10-CM | POA: Diagnosis not present

## 2019-03-04 DIAGNOSIS — M6281 Muscle weakness (generalized): Secondary | ICD-10-CM | POA: Diagnosis not present

## 2019-03-04 DIAGNOSIS — F411 Generalized anxiety disorder: Secondary | ICD-10-CM | POA: Diagnosis not present

## 2019-03-04 DIAGNOSIS — R296 Repeated falls: Secondary | ICD-10-CM | POA: Diagnosis not present

## 2019-03-04 DIAGNOSIS — R2681 Unsteadiness on feet: Secondary | ICD-10-CM | POA: Diagnosis not present

## 2019-03-04 DIAGNOSIS — F331 Major depressive disorder, recurrent, moderate: Secondary | ICD-10-CM | POA: Diagnosis not present

## 2019-03-04 DIAGNOSIS — R2689 Other abnormalities of gait and mobility: Secondary | ICD-10-CM | POA: Diagnosis not present

## 2019-03-05 DIAGNOSIS — R2689 Other abnormalities of gait and mobility: Secondary | ICD-10-CM | POA: Diagnosis not present

## 2019-03-05 DIAGNOSIS — R296 Repeated falls: Secondary | ICD-10-CM | POA: Diagnosis not present

## 2019-03-05 DIAGNOSIS — R2681 Unsteadiness on feet: Secondary | ICD-10-CM | POA: Diagnosis not present

## 2019-03-05 DIAGNOSIS — M6281 Muscle weakness (generalized): Secondary | ICD-10-CM | POA: Diagnosis not present

## 2019-03-06 DIAGNOSIS — R2681 Unsteadiness on feet: Secondary | ICD-10-CM | POA: Diagnosis not present

## 2019-03-06 DIAGNOSIS — R296 Repeated falls: Secondary | ICD-10-CM | POA: Diagnosis not present

## 2019-03-06 DIAGNOSIS — M6281 Muscle weakness (generalized): Secondary | ICD-10-CM | POA: Diagnosis not present

## 2019-03-06 DIAGNOSIS — R2689 Other abnormalities of gait and mobility: Secondary | ICD-10-CM | POA: Diagnosis not present

## 2019-03-09 DIAGNOSIS — M6281 Muscle weakness (generalized): Secondary | ICD-10-CM | POA: Diagnosis not present

## 2019-03-09 DIAGNOSIS — R2689 Other abnormalities of gait and mobility: Secondary | ICD-10-CM | POA: Diagnosis not present

## 2019-03-09 DIAGNOSIS — R296 Repeated falls: Secondary | ICD-10-CM | POA: Diagnosis not present

## 2019-03-09 DIAGNOSIS — R2681 Unsteadiness on feet: Secondary | ICD-10-CM | POA: Diagnosis not present

## 2019-03-10 DIAGNOSIS — R296 Repeated falls: Secondary | ICD-10-CM | POA: Diagnosis not present

## 2019-03-10 DIAGNOSIS — R2689 Other abnormalities of gait and mobility: Secondary | ICD-10-CM | POA: Diagnosis not present

## 2019-03-10 DIAGNOSIS — R2681 Unsteadiness on feet: Secondary | ICD-10-CM | POA: Diagnosis not present

## 2019-03-10 DIAGNOSIS — M6281 Muscle weakness (generalized): Secondary | ICD-10-CM | POA: Diagnosis not present

## 2019-03-11 DIAGNOSIS — F331 Major depressive disorder, recurrent, moderate: Secondary | ICD-10-CM | POA: Diagnosis not present

## 2019-03-11 DIAGNOSIS — F411 Generalized anxiety disorder: Secondary | ICD-10-CM | POA: Diagnosis not present

## 2019-03-12 DIAGNOSIS — R2681 Unsteadiness on feet: Secondary | ICD-10-CM | POA: Diagnosis not present

## 2019-03-12 DIAGNOSIS — R2689 Other abnormalities of gait and mobility: Secondary | ICD-10-CM | POA: Diagnosis not present

## 2019-03-12 DIAGNOSIS — M6281 Muscle weakness (generalized): Secondary | ICD-10-CM | POA: Diagnosis not present

## 2019-03-12 DIAGNOSIS — R296 Repeated falls: Secondary | ICD-10-CM | POA: Diagnosis not present

## 2019-03-13 DIAGNOSIS — R2681 Unsteadiness on feet: Secondary | ICD-10-CM | POA: Diagnosis not present

## 2019-03-13 DIAGNOSIS — R2689 Other abnormalities of gait and mobility: Secondary | ICD-10-CM | POA: Diagnosis not present

## 2019-03-13 DIAGNOSIS — R296 Repeated falls: Secondary | ICD-10-CM | POA: Diagnosis not present

## 2019-03-13 DIAGNOSIS — M6281 Muscle weakness (generalized): Secondary | ICD-10-CM | POA: Diagnosis not present

## 2019-03-14 DIAGNOSIS — L97521 Non-pressure chronic ulcer of other part of left foot limited to breakdown of skin: Secondary | ICD-10-CM | POA: Diagnosis not present

## 2019-03-14 DIAGNOSIS — L84 Corns and callosities: Secondary | ICD-10-CM | POA: Diagnosis not present

## 2019-03-14 DIAGNOSIS — B351 Tinea unguium: Secondary | ICD-10-CM | POA: Diagnosis not present

## 2019-03-14 DIAGNOSIS — I739 Peripheral vascular disease, unspecified: Secondary | ICD-10-CM | POA: Diagnosis not present

## 2019-03-17 DIAGNOSIS — R2681 Unsteadiness on feet: Secondary | ICD-10-CM | POA: Diagnosis not present

## 2019-03-17 DIAGNOSIS — M6281 Muscle weakness (generalized): Secondary | ICD-10-CM | POA: Diagnosis not present

## 2019-03-17 DIAGNOSIS — R296 Repeated falls: Secondary | ICD-10-CM | POA: Diagnosis not present

## 2019-03-17 DIAGNOSIS — R2689 Other abnormalities of gait and mobility: Secondary | ICD-10-CM | POA: Diagnosis not present

## 2019-03-18 DIAGNOSIS — F411 Generalized anxiety disorder: Secondary | ICD-10-CM | POA: Diagnosis not present

## 2019-03-18 DIAGNOSIS — F331 Major depressive disorder, recurrent, moderate: Secondary | ICD-10-CM | POA: Diagnosis not present

## 2019-03-19 DIAGNOSIS — R296 Repeated falls: Secondary | ICD-10-CM | POA: Diagnosis not present

## 2019-03-19 DIAGNOSIS — M6281 Muscle weakness (generalized): Secondary | ICD-10-CM | POA: Diagnosis not present

## 2019-03-19 DIAGNOSIS — R2681 Unsteadiness on feet: Secondary | ICD-10-CM | POA: Diagnosis not present

## 2019-03-19 DIAGNOSIS — R2689 Other abnormalities of gait and mobility: Secondary | ICD-10-CM | POA: Diagnosis not present

## 2019-03-20 DIAGNOSIS — R2681 Unsteadiness on feet: Secondary | ICD-10-CM | POA: Diagnosis not present

## 2019-03-20 DIAGNOSIS — M6281 Muscle weakness (generalized): Secondary | ICD-10-CM | POA: Diagnosis not present

## 2019-03-20 DIAGNOSIS — R2689 Other abnormalities of gait and mobility: Secondary | ICD-10-CM | POA: Diagnosis not present

## 2019-03-20 DIAGNOSIS — R296 Repeated falls: Secondary | ICD-10-CM | POA: Diagnosis not present

## 2019-03-21 DIAGNOSIS — R296 Repeated falls: Secondary | ICD-10-CM | POA: Diagnosis not present

## 2019-03-21 DIAGNOSIS — R2689 Other abnormalities of gait and mobility: Secondary | ICD-10-CM | POA: Diagnosis not present

## 2019-03-21 DIAGNOSIS — M6281 Muscle weakness (generalized): Secondary | ICD-10-CM | POA: Diagnosis not present

## 2019-03-21 DIAGNOSIS — R2681 Unsteadiness on feet: Secondary | ICD-10-CM | POA: Diagnosis not present

## 2019-03-23 DIAGNOSIS — R296 Repeated falls: Secondary | ICD-10-CM | POA: Diagnosis not present

## 2019-03-23 DIAGNOSIS — R2681 Unsteadiness on feet: Secondary | ICD-10-CM | POA: Diagnosis not present

## 2019-03-23 DIAGNOSIS — M6281 Muscle weakness (generalized): Secondary | ICD-10-CM | POA: Diagnosis not present

## 2019-03-23 DIAGNOSIS — R2689 Other abnormalities of gait and mobility: Secondary | ICD-10-CM | POA: Diagnosis not present

## 2019-03-24 DIAGNOSIS — F314 Bipolar disorder, current episode depressed, severe, without psychotic features: Secondary | ICD-10-CM | POA: Diagnosis not present

## 2019-03-24 DIAGNOSIS — R2689 Other abnormalities of gait and mobility: Secondary | ICD-10-CM | POA: Diagnosis not present

## 2019-03-24 DIAGNOSIS — R2681 Unsteadiness on feet: Secondary | ICD-10-CM | POA: Diagnosis not present

## 2019-03-24 DIAGNOSIS — M6281 Muscle weakness (generalized): Secondary | ICD-10-CM | POA: Diagnosis not present

## 2019-03-24 DIAGNOSIS — R296 Repeated falls: Secondary | ICD-10-CM | POA: Diagnosis not present

## 2019-03-25 DIAGNOSIS — F411 Generalized anxiety disorder: Secondary | ICD-10-CM | POA: Diagnosis not present

## 2019-03-25 DIAGNOSIS — F331 Major depressive disorder, recurrent, moderate: Secondary | ICD-10-CM | POA: Diagnosis not present

## 2019-03-26 DIAGNOSIS — R2689 Other abnormalities of gait and mobility: Secondary | ICD-10-CM | POA: Diagnosis not present

## 2019-03-26 DIAGNOSIS — R296 Repeated falls: Secondary | ICD-10-CM | POA: Diagnosis not present

## 2019-03-26 DIAGNOSIS — M6281 Muscle weakness (generalized): Secondary | ICD-10-CM | POA: Diagnosis not present

## 2019-03-26 DIAGNOSIS — R2681 Unsteadiness on feet: Secondary | ICD-10-CM | POA: Diagnosis not present

## 2019-03-27 DIAGNOSIS — R2689 Other abnormalities of gait and mobility: Secondary | ICD-10-CM | POA: Diagnosis not present

## 2019-03-27 DIAGNOSIS — R2681 Unsteadiness on feet: Secondary | ICD-10-CM | POA: Diagnosis not present

## 2019-03-27 DIAGNOSIS — R296 Repeated falls: Secondary | ICD-10-CM | POA: Diagnosis not present

## 2019-03-27 DIAGNOSIS — M6281 Muscle weakness (generalized): Secondary | ICD-10-CM | POA: Diagnosis not present

## 2019-03-30 DIAGNOSIS — R296 Repeated falls: Secondary | ICD-10-CM | POA: Diagnosis not present

## 2019-03-30 DIAGNOSIS — R2681 Unsteadiness on feet: Secondary | ICD-10-CM | POA: Diagnosis not present

## 2019-03-30 DIAGNOSIS — R2689 Other abnormalities of gait and mobility: Secondary | ICD-10-CM | POA: Diagnosis not present

## 2019-03-30 DIAGNOSIS — M6281 Muscle weakness (generalized): Secondary | ICD-10-CM | POA: Diagnosis not present

## 2019-03-31 DIAGNOSIS — R296 Repeated falls: Secondary | ICD-10-CM | POA: Diagnosis not present

## 2019-03-31 DIAGNOSIS — R2681 Unsteadiness on feet: Secondary | ICD-10-CM | POA: Diagnosis not present

## 2019-03-31 DIAGNOSIS — R2689 Other abnormalities of gait and mobility: Secondary | ICD-10-CM | POA: Diagnosis not present

## 2019-03-31 DIAGNOSIS — M6281 Muscle weakness (generalized): Secondary | ICD-10-CM | POA: Diagnosis not present

## 2019-04-01 DIAGNOSIS — R296 Repeated falls: Secondary | ICD-10-CM | POA: Diagnosis not present

## 2019-04-01 DIAGNOSIS — R2689 Other abnormalities of gait and mobility: Secondary | ICD-10-CM | POA: Diagnosis not present

## 2019-04-01 DIAGNOSIS — F411 Generalized anxiety disorder: Secondary | ICD-10-CM | POA: Diagnosis not present

## 2019-04-01 DIAGNOSIS — R2681 Unsteadiness on feet: Secondary | ICD-10-CM | POA: Diagnosis not present

## 2019-04-01 DIAGNOSIS — F331 Major depressive disorder, recurrent, moderate: Secondary | ICD-10-CM | POA: Diagnosis not present

## 2019-04-01 DIAGNOSIS — M6281 Muscle weakness (generalized): Secondary | ICD-10-CM | POA: Diagnosis not present

## 2019-04-02 DIAGNOSIS — R2689 Other abnormalities of gait and mobility: Secondary | ICD-10-CM | POA: Diagnosis not present

## 2019-04-02 DIAGNOSIS — R2681 Unsteadiness on feet: Secondary | ICD-10-CM | POA: Diagnosis not present

## 2019-04-02 DIAGNOSIS — R296 Repeated falls: Secondary | ICD-10-CM | POA: Diagnosis not present

## 2019-04-02 DIAGNOSIS — M6281 Muscle weakness (generalized): Secondary | ICD-10-CM | POA: Diagnosis not present

## 2019-04-03 DIAGNOSIS — R41841 Cognitive communication deficit: Secondary | ICD-10-CM | POA: Diagnosis not present

## 2019-04-06 DIAGNOSIS — R296 Repeated falls: Secondary | ICD-10-CM | POA: Diagnosis not present

## 2019-04-06 DIAGNOSIS — R41841 Cognitive communication deficit: Secondary | ICD-10-CM | POA: Diagnosis not present

## 2019-04-06 DIAGNOSIS — R2689 Other abnormalities of gait and mobility: Secondary | ICD-10-CM | POA: Diagnosis not present

## 2019-04-06 DIAGNOSIS — M6281 Muscle weakness (generalized): Secondary | ICD-10-CM | POA: Diagnosis not present

## 2019-04-06 DIAGNOSIS — R2681 Unsteadiness on feet: Secondary | ICD-10-CM | POA: Diagnosis not present

## 2019-04-07 DIAGNOSIS — R2689 Other abnormalities of gait and mobility: Secondary | ICD-10-CM | POA: Diagnosis not present

## 2019-04-07 DIAGNOSIS — R296 Repeated falls: Secondary | ICD-10-CM | POA: Diagnosis not present

## 2019-04-07 DIAGNOSIS — R41841 Cognitive communication deficit: Secondary | ICD-10-CM | POA: Diagnosis not present

## 2019-04-07 DIAGNOSIS — R2681 Unsteadiness on feet: Secondary | ICD-10-CM | POA: Diagnosis not present

## 2019-04-07 DIAGNOSIS — F329 Major depressive disorder, single episode, unspecified: Secondary | ICD-10-CM | POA: Diagnosis not present

## 2019-04-07 DIAGNOSIS — M6281 Muscle weakness (generalized): Secondary | ICD-10-CM | POA: Diagnosis not present

## 2019-04-07 DIAGNOSIS — H6123 Impacted cerumen, bilateral: Secondary | ICD-10-CM | POA: Diagnosis not present

## 2019-04-07 DIAGNOSIS — E538 Deficiency of other specified B group vitamins: Secondary | ICD-10-CM | POA: Diagnosis not present

## 2019-04-07 DIAGNOSIS — F17210 Nicotine dependence, cigarettes, uncomplicated: Secondary | ICD-10-CM | POA: Diagnosis not present

## 2019-04-08 DIAGNOSIS — F331 Major depressive disorder, recurrent, moderate: Secondary | ICD-10-CM | POA: Diagnosis not present

## 2019-04-08 DIAGNOSIS — M6281 Muscle weakness (generalized): Secondary | ICD-10-CM | POA: Diagnosis not present

## 2019-04-08 DIAGNOSIS — R2681 Unsteadiness on feet: Secondary | ICD-10-CM | POA: Diagnosis not present

## 2019-04-08 DIAGNOSIS — F411 Generalized anxiety disorder: Secondary | ICD-10-CM | POA: Diagnosis not present

## 2019-04-08 DIAGNOSIS — R2689 Other abnormalities of gait and mobility: Secondary | ICD-10-CM | POA: Diagnosis not present

## 2019-04-08 DIAGNOSIS — R296 Repeated falls: Secondary | ICD-10-CM | POA: Diagnosis not present

## 2019-04-09 DIAGNOSIS — R2689 Other abnormalities of gait and mobility: Secondary | ICD-10-CM | POA: Diagnosis not present

## 2019-04-09 DIAGNOSIS — R41841 Cognitive communication deficit: Secondary | ICD-10-CM | POA: Diagnosis not present

## 2019-04-09 DIAGNOSIS — M6281 Muscle weakness (generalized): Secondary | ICD-10-CM | POA: Diagnosis not present

## 2019-04-09 DIAGNOSIS — H6123 Impacted cerumen, bilateral: Secondary | ICD-10-CM | POA: Diagnosis not present

## 2019-04-09 DIAGNOSIS — R296 Repeated falls: Secondary | ICD-10-CM | POA: Diagnosis not present

## 2019-04-09 DIAGNOSIS — R2681 Unsteadiness on feet: Secondary | ICD-10-CM | POA: Diagnosis not present

## 2019-04-09 DIAGNOSIS — F339 Major depressive disorder, recurrent, unspecified: Secondary | ICD-10-CM | POA: Diagnosis not present

## 2019-04-09 DIAGNOSIS — E538 Deficiency of other specified B group vitamins: Secondary | ICD-10-CM | POA: Diagnosis not present

## 2019-04-09 DIAGNOSIS — F17210 Nicotine dependence, cigarettes, uncomplicated: Secondary | ICD-10-CM | POA: Diagnosis not present

## 2019-04-13 DIAGNOSIS — R41841 Cognitive communication deficit: Secondary | ICD-10-CM | POA: Diagnosis not present

## 2019-04-13 DIAGNOSIS — M6281 Muscle weakness (generalized): Secondary | ICD-10-CM | POA: Diagnosis not present

## 2019-04-13 DIAGNOSIS — R2681 Unsteadiness on feet: Secondary | ICD-10-CM | POA: Diagnosis not present

## 2019-04-13 DIAGNOSIS — R2689 Other abnormalities of gait and mobility: Secondary | ICD-10-CM | POA: Diagnosis not present

## 2019-04-13 DIAGNOSIS — R296 Repeated falls: Secondary | ICD-10-CM | POA: Diagnosis not present

## 2019-04-14 DIAGNOSIS — R41841 Cognitive communication deficit: Secondary | ICD-10-CM | POA: Diagnosis not present

## 2019-04-14 DIAGNOSIS — R296 Repeated falls: Secondary | ICD-10-CM | POA: Diagnosis not present

## 2019-04-14 DIAGNOSIS — R2689 Other abnormalities of gait and mobility: Secondary | ICD-10-CM | POA: Diagnosis not present

## 2019-04-14 DIAGNOSIS — R2681 Unsteadiness on feet: Secondary | ICD-10-CM | POA: Diagnosis not present

## 2019-04-14 DIAGNOSIS — M6281 Muscle weakness (generalized): Secondary | ICD-10-CM | POA: Diagnosis not present

## 2019-04-15 DIAGNOSIS — F411 Generalized anxiety disorder: Secondary | ICD-10-CM | POA: Diagnosis not present

## 2019-04-15 DIAGNOSIS — F331 Major depressive disorder, recurrent, moderate: Secondary | ICD-10-CM | POA: Diagnosis not present

## 2019-04-16 DIAGNOSIS — R296 Repeated falls: Secondary | ICD-10-CM | POA: Diagnosis not present

## 2019-04-16 DIAGNOSIS — F3181 Bipolar II disorder: Secondary | ICD-10-CM | POA: Diagnosis not present

## 2019-04-16 DIAGNOSIS — R41841 Cognitive communication deficit: Secondary | ICD-10-CM | POA: Diagnosis not present

## 2019-04-16 DIAGNOSIS — M6281 Muscle weakness (generalized): Secondary | ICD-10-CM | POA: Diagnosis not present

## 2019-04-16 DIAGNOSIS — E538 Deficiency of other specified B group vitamins: Secondary | ICD-10-CM | POA: Diagnosis not present

## 2019-04-16 DIAGNOSIS — Z79899 Other long term (current) drug therapy: Secondary | ICD-10-CM | POA: Diagnosis not present

## 2019-04-16 DIAGNOSIS — R2681 Unsteadiness on feet: Secondary | ICD-10-CM | POA: Diagnosis not present

## 2019-04-16 DIAGNOSIS — R2689 Other abnormalities of gait and mobility: Secondary | ICD-10-CM | POA: Diagnosis not present

## 2019-04-17 DIAGNOSIS — R2689 Other abnormalities of gait and mobility: Secondary | ICD-10-CM | POA: Diagnosis not present

## 2019-04-17 DIAGNOSIS — R2681 Unsteadiness on feet: Secondary | ICD-10-CM | POA: Diagnosis not present

## 2019-04-17 DIAGNOSIS — R296 Repeated falls: Secondary | ICD-10-CM | POA: Diagnosis not present

## 2019-04-17 DIAGNOSIS — M6281 Muscle weakness (generalized): Secondary | ICD-10-CM | POA: Diagnosis not present

## 2019-04-20 DIAGNOSIS — R2681 Unsteadiness on feet: Secondary | ICD-10-CM | POA: Diagnosis not present

## 2019-04-20 DIAGNOSIS — R41841 Cognitive communication deficit: Secondary | ICD-10-CM | POA: Diagnosis not present

## 2019-04-20 DIAGNOSIS — R296 Repeated falls: Secondary | ICD-10-CM | POA: Diagnosis not present

## 2019-04-20 DIAGNOSIS — M6281 Muscle weakness (generalized): Secondary | ICD-10-CM | POA: Diagnosis not present

## 2019-04-20 DIAGNOSIS — R2689 Other abnormalities of gait and mobility: Secondary | ICD-10-CM | POA: Diagnosis not present

## 2019-04-21 DIAGNOSIS — R296 Repeated falls: Secondary | ICD-10-CM | POA: Diagnosis not present

## 2019-04-21 DIAGNOSIS — M6281 Muscle weakness (generalized): Secondary | ICD-10-CM | POA: Diagnosis not present

## 2019-04-21 DIAGNOSIS — R2681 Unsteadiness on feet: Secondary | ICD-10-CM | POA: Diagnosis not present

## 2019-04-21 DIAGNOSIS — R41841 Cognitive communication deficit: Secondary | ICD-10-CM | POA: Diagnosis not present

## 2019-04-21 DIAGNOSIS — R2689 Other abnormalities of gait and mobility: Secondary | ICD-10-CM | POA: Diagnosis not present

## 2019-04-23 DIAGNOSIS — R2689 Other abnormalities of gait and mobility: Secondary | ICD-10-CM | POA: Diagnosis not present

## 2019-04-23 DIAGNOSIS — R41841 Cognitive communication deficit: Secondary | ICD-10-CM | POA: Diagnosis not present

## 2019-04-23 DIAGNOSIS — R296 Repeated falls: Secondary | ICD-10-CM | POA: Diagnosis not present

## 2019-04-23 DIAGNOSIS — M6281 Muscle weakness (generalized): Secondary | ICD-10-CM | POA: Diagnosis not present

## 2019-04-23 DIAGNOSIS — R2681 Unsteadiness on feet: Secondary | ICD-10-CM | POA: Diagnosis not present

## 2019-04-24 DIAGNOSIS — M6281 Muscle weakness (generalized): Secondary | ICD-10-CM | POA: Diagnosis not present

## 2019-04-24 DIAGNOSIS — R2681 Unsteadiness on feet: Secondary | ICD-10-CM | POA: Diagnosis not present

## 2019-04-24 DIAGNOSIS — R2689 Other abnormalities of gait and mobility: Secondary | ICD-10-CM | POA: Diagnosis not present

## 2019-04-24 DIAGNOSIS — R296 Repeated falls: Secondary | ICD-10-CM | POA: Diagnosis not present

## 2019-04-27 DIAGNOSIS — R296 Repeated falls: Secondary | ICD-10-CM | POA: Diagnosis not present

## 2019-04-27 DIAGNOSIS — M6281 Muscle weakness (generalized): Secondary | ICD-10-CM | POA: Diagnosis not present

## 2019-04-27 DIAGNOSIS — R41841 Cognitive communication deficit: Secondary | ICD-10-CM | POA: Diagnosis not present

## 2019-04-27 DIAGNOSIS — R2681 Unsteadiness on feet: Secondary | ICD-10-CM | POA: Diagnosis not present

## 2019-04-27 DIAGNOSIS — R2689 Other abnormalities of gait and mobility: Secondary | ICD-10-CM | POA: Diagnosis not present

## 2019-04-28 DIAGNOSIS — N179 Acute kidney failure, unspecified: Secondary | ICD-10-CM | POA: Diagnosis not present

## 2019-04-28 DIAGNOSIS — R2681 Unsteadiness on feet: Secondary | ICD-10-CM | POA: Diagnosis not present

## 2019-04-28 DIAGNOSIS — E538 Deficiency of other specified B group vitamins: Secondary | ICD-10-CM | POA: Diagnosis not present

## 2019-04-28 DIAGNOSIS — R41841 Cognitive communication deficit: Secondary | ICD-10-CM | POA: Diagnosis not present

## 2019-04-28 DIAGNOSIS — H6123 Impacted cerumen, bilateral: Secondary | ICD-10-CM | POA: Diagnosis not present

## 2019-04-28 DIAGNOSIS — R296 Repeated falls: Secondary | ICD-10-CM | POA: Diagnosis not present

## 2019-04-28 DIAGNOSIS — R1030 Lower abdominal pain, unspecified: Secondary | ICD-10-CM | POA: Diagnosis not present

## 2019-04-28 DIAGNOSIS — R140 Abdominal distension (gaseous): Secondary | ICD-10-CM | POA: Diagnosis not present

## 2019-04-28 DIAGNOSIS — R2689 Other abnormalities of gait and mobility: Secondary | ICD-10-CM | POA: Diagnosis not present

## 2019-04-28 DIAGNOSIS — M6281 Muscle weakness (generalized): Secondary | ICD-10-CM | POA: Diagnosis not present

## 2019-04-28 DIAGNOSIS — R030 Elevated blood-pressure reading, without diagnosis of hypertension: Secondary | ICD-10-CM | POA: Diagnosis not present

## 2019-04-29 DIAGNOSIS — F314 Bipolar disorder, current episode depressed, severe, without psychotic features: Secondary | ICD-10-CM | POA: Diagnosis not present

## 2019-04-29 DIAGNOSIS — R41841 Cognitive communication deficit: Secondary | ICD-10-CM | POA: Diagnosis not present

## 2019-04-29 DIAGNOSIS — R296 Repeated falls: Secondary | ICD-10-CM | POA: Diagnosis not present

## 2019-04-29 DIAGNOSIS — R2689 Other abnormalities of gait and mobility: Secondary | ICD-10-CM | POA: Diagnosis not present

## 2019-04-29 DIAGNOSIS — R2681 Unsteadiness on feet: Secondary | ICD-10-CM | POA: Diagnosis not present

## 2019-04-29 DIAGNOSIS — M6281 Muscle weakness (generalized): Secondary | ICD-10-CM | POA: Diagnosis not present

## 2019-04-30 DIAGNOSIS — R296 Repeated falls: Secondary | ICD-10-CM | POA: Diagnosis not present

## 2019-04-30 DIAGNOSIS — M6281 Muscle weakness (generalized): Secondary | ICD-10-CM | POA: Diagnosis not present

## 2019-04-30 DIAGNOSIS — R2689 Other abnormalities of gait and mobility: Secondary | ICD-10-CM | POA: Diagnosis not present

## 2019-04-30 DIAGNOSIS — R41841 Cognitive communication deficit: Secondary | ICD-10-CM | POA: Diagnosis not present

## 2019-04-30 DIAGNOSIS — R2681 Unsteadiness on feet: Secondary | ICD-10-CM | POA: Diagnosis not present

## 2019-05-01 DIAGNOSIS — R296 Repeated falls: Secondary | ICD-10-CM | POA: Diagnosis not present

## 2019-05-01 DIAGNOSIS — M6281 Muscle weakness (generalized): Secondary | ICD-10-CM | POA: Diagnosis not present

## 2019-05-01 DIAGNOSIS — R2681 Unsteadiness on feet: Secondary | ICD-10-CM | POA: Diagnosis not present

## 2019-05-01 DIAGNOSIS — R41841 Cognitive communication deficit: Secondary | ICD-10-CM | POA: Diagnosis not present

## 2019-05-01 DIAGNOSIS — R2689 Other abnormalities of gait and mobility: Secondary | ICD-10-CM | POA: Diagnosis not present

## 2019-05-04 DIAGNOSIS — M6281 Muscle weakness (generalized): Secondary | ICD-10-CM | POA: Diagnosis not present

## 2019-05-04 DIAGNOSIS — R2689 Other abnormalities of gait and mobility: Secondary | ICD-10-CM | POA: Diagnosis not present

## 2019-05-04 DIAGNOSIS — R2681 Unsteadiness on feet: Secondary | ICD-10-CM | POA: Diagnosis not present

## 2019-05-04 DIAGNOSIS — R41841 Cognitive communication deficit: Secondary | ICD-10-CM | POA: Diagnosis not present

## 2019-05-04 DIAGNOSIS — R296 Repeated falls: Secondary | ICD-10-CM | POA: Diagnosis not present

## 2019-05-05 DIAGNOSIS — R2681 Unsteadiness on feet: Secondary | ICD-10-CM | POA: Diagnosis not present

## 2019-05-05 DIAGNOSIS — R2689 Other abnormalities of gait and mobility: Secondary | ICD-10-CM | POA: Diagnosis not present

## 2019-05-05 DIAGNOSIS — M6281 Muscle weakness (generalized): Secondary | ICD-10-CM | POA: Diagnosis not present

## 2019-05-05 DIAGNOSIS — R296 Repeated falls: Secondary | ICD-10-CM | POA: Diagnosis not present

## 2019-05-05 DIAGNOSIS — R41841 Cognitive communication deficit: Secondary | ICD-10-CM | POA: Diagnosis not present

## 2019-05-06 DIAGNOSIS — R296 Repeated falls: Secondary | ICD-10-CM | POA: Diagnosis not present

## 2019-05-06 DIAGNOSIS — F411 Generalized anxiety disorder: Secondary | ICD-10-CM | POA: Diagnosis not present

## 2019-05-06 DIAGNOSIS — R2689 Other abnormalities of gait and mobility: Secondary | ICD-10-CM | POA: Diagnosis not present

## 2019-05-06 DIAGNOSIS — M6281 Muscle weakness (generalized): Secondary | ICD-10-CM | POA: Diagnosis not present

## 2019-05-06 DIAGNOSIS — F331 Major depressive disorder, recurrent, moderate: Secondary | ICD-10-CM | POA: Diagnosis not present

## 2019-05-06 DIAGNOSIS — R41841 Cognitive communication deficit: Secondary | ICD-10-CM | POA: Diagnosis not present

## 2019-05-06 DIAGNOSIS — R2681 Unsteadiness on feet: Secondary | ICD-10-CM | POA: Diagnosis not present

## 2019-05-07 DIAGNOSIS — M6281 Muscle weakness (generalized): Secondary | ICD-10-CM | POA: Diagnosis not present

## 2019-05-07 DIAGNOSIS — R2681 Unsteadiness on feet: Secondary | ICD-10-CM | POA: Diagnosis not present

## 2019-05-07 DIAGNOSIS — R41841 Cognitive communication deficit: Secondary | ICD-10-CM | POA: Diagnosis not present

## 2019-05-07 DIAGNOSIS — R2689 Other abnormalities of gait and mobility: Secondary | ICD-10-CM | POA: Diagnosis not present

## 2019-05-07 DIAGNOSIS — R296 Repeated falls: Secondary | ICD-10-CM | POA: Diagnosis not present

## 2019-05-11 DIAGNOSIS — R41841 Cognitive communication deficit: Secondary | ICD-10-CM | POA: Diagnosis not present

## 2019-05-11 DIAGNOSIS — R296 Repeated falls: Secondary | ICD-10-CM | POA: Diagnosis not present

## 2019-05-11 DIAGNOSIS — M6281 Muscle weakness (generalized): Secondary | ICD-10-CM | POA: Diagnosis not present

## 2019-05-11 DIAGNOSIS — R2689 Other abnormalities of gait and mobility: Secondary | ICD-10-CM | POA: Diagnosis not present

## 2019-05-11 DIAGNOSIS — R2681 Unsteadiness on feet: Secondary | ICD-10-CM | POA: Diagnosis not present

## 2019-05-12 DIAGNOSIS — R2689 Other abnormalities of gait and mobility: Secondary | ICD-10-CM | POA: Diagnosis not present

## 2019-05-12 DIAGNOSIS — M6281 Muscle weakness (generalized): Secondary | ICD-10-CM | POA: Diagnosis not present

## 2019-05-12 DIAGNOSIS — R296 Repeated falls: Secondary | ICD-10-CM | POA: Diagnosis not present

## 2019-05-12 DIAGNOSIS — R2681 Unsteadiness on feet: Secondary | ICD-10-CM | POA: Diagnosis not present

## 2019-05-12 DIAGNOSIS — R41841 Cognitive communication deficit: Secondary | ICD-10-CM | POA: Diagnosis not present

## 2019-05-13 DIAGNOSIS — R2681 Unsteadiness on feet: Secondary | ICD-10-CM | POA: Diagnosis not present

## 2019-05-13 DIAGNOSIS — R296 Repeated falls: Secondary | ICD-10-CM | POA: Diagnosis not present

## 2019-05-13 DIAGNOSIS — M6281 Muscle weakness (generalized): Secondary | ICD-10-CM | POA: Diagnosis not present

## 2019-05-13 DIAGNOSIS — R41841 Cognitive communication deficit: Secondary | ICD-10-CM | POA: Diagnosis not present

## 2019-05-13 DIAGNOSIS — R2689 Other abnormalities of gait and mobility: Secondary | ICD-10-CM | POA: Diagnosis not present

## 2019-05-14 DIAGNOSIS — R2681 Unsteadiness on feet: Secondary | ICD-10-CM | POA: Diagnosis not present

## 2019-05-14 DIAGNOSIS — R296 Repeated falls: Secondary | ICD-10-CM | POA: Diagnosis not present

## 2019-05-14 DIAGNOSIS — R41841 Cognitive communication deficit: Secondary | ICD-10-CM | POA: Diagnosis not present

## 2019-05-14 DIAGNOSIS — R2689 Other abnormalities of gait and mobility: Secondary | ICD-10-CM | POA: Diagnosis not present

## 2019-05-14 DIAGNOSIS — M6281 Muscle weakness (generalized): Secondary | ICD-10-CM | POA: Diagnosis not present

## 2019-05-15 DIAGNOSIS — R2681 Unsteadiness on feet: Secondary | ICD-10-CM | POA: Diagnosis not present

## 2019-05-15 DIAGNOSIS — M6281 Muscle weakness (generalized): Secondary | ICD-10-CM | POA: Diagnosis not present

## 2019-05-15 DIAGNOSIS — R41841 Cognitive communication deficit: Secondary | ICD-10-CM | POA: Diagnosis not present

## 2019-05-15 DIAGNOSIS — R2689 Other abnormalities of gait and mobility: Secondary | ICD-10-CM | POA: Diagnosis not present

## 2019-05-15 DIAGNOSIS — R296 Repeated falls: Secondary | ICD-10-CM | POA: Diagnosis not present

## 2019-05-18 DIAGNOSIS — M6281 Muscle weakness (generalized): Secondary | ICD-10-CM | POA: Diagnosis not present

## 2019-05-18 DIAGNOSIS — R2689 Other abnormalities of gait and mobility: Secondary | ICD-10-CM | POA: Diagnosis not present

## 2019-05-18 DIAGNOSIS — R41841 Cognitive communication deficit: Secondary | ICD-10-CM | POA: Diagnosis not present

## 2019-05-18 DIAGNOSIS — R296 Repeated falls: Secondary | ICD-10-CM | POA: Diagnosis not present

## 2019-05-18 DIAGNOSIS — R2681 Unsteadiness on feet: Secondary | ICD-10-CM | POA: Diagnosis not present

## 2019-05-19 DIAGNOSIS — M6281 Muscle weakness (generalized): Secondary | ICD-10-CM | POA: Diagnosis not present

## 2019-05-19 DIAGNOSIS — R2689 Other abnormalities of gait and mobility: Secondary | ICD-10-CM | POA: Diagnosis not present

## 2019-05-19 DIAGNOSIS — E7439 Other disorders of intestinal carbohydrate absorption: Secondary | ICD-10-CM | POA: Diagnosis not present

## 2019-05-19 DIAGNOSIS — R030 Elevated blood-pressure reading, without diagnosis of hypertension: Secondary | ICD-10-CM | POA: Diagnosis not present

## 2019-05-19 DIAGNOSIS — R109 Unspecified abdominal pain: Secondary | ICD-10-CM | POA: Diagnosis not present

## 2019-05-19 DIAGNOSIS — R7401 Elevation of levels of liver transaminase levels: Secondary | ICD-10-CM | POA: Diagnosis not present

## 2019-05-19 DIAGNOSIS — R41841 Cognitive communication deficit: Secondary | ICD-10-CM | POA: Diagnosis not present

## 2019-05-19 DIAGNOSIS — R2681 Unsteadiness on feet: Secondary | ICD-10-CM | POA: Diagnosis not present

## 2019-05-19 DIAGNOSIS — R296 Repeated falls: Secondary | ICD-10-CM | POA: Diagnosis not present

## 2019-05-19 DIAGNOSIS — N179 Acute kidney failure, unspecified: Secondary | ICD-10-CM | POA: Diagnosis not present

## 2019-05-20 DIAGNOSIS — R2689 Other abnormalities of gait and mobility: Secondary | ICD-10-CM | POA: Diagnosis not present

## 2019-05-20 DIAGNOSIS — R41841 Cognitive communication deficit: Secondary | ICD-10-CM | POA: Diagnosis not present

## 2019-05-20 DIAGNOSIS — M6281 Muscle weakness (generalized): Secondary | ICD-10-CM | POA: Diagnosis not present

## 2019-05-20 DIAGNOSIS — R2681 Unsteadiness on feet: Secondary | ICD-10-CM | POA: Diagnosis not present

## 2019-05-20 DIAGNOSIS — F411 Generalized anxiety disorder: Secondary | ICD-10-CM | POA: Diagnosis not present

## 2019-05-20 DIAGNOSIS — F331 Major depressive disorder, recurrent, moderate: Secondary | ICD-10-CM | POA: Diagnosis not present

## 2019-05-20 DIAGNOSIS — R296 Repeated falls: Secondary | ICD-10-CM | POA: Diagnosis not present

## 2019-05-22 DIAGNOSIS — R296 Repeated falls: Secondary | ICD-10-CM | POA: Diagnosis not present

## 2019-05-22 DIAGNOSIS — M6281 Muscle weakness (generalized): Secondary | ICD-10-CM | POA: Diagnosis not present

## 2019-05-22 DIAGNOSIS — R2689 Other abnormalities of gait and mobility: Secondary | ICD-10-CM | POA: Diagnosis not present

## 2019-05-22 DIAGNOSIS — R41841 Cognitive communication deficit: Secondary | ICD-10-CM | POA: Diagnosis not present

## 2019-05-22 DIAGNOSIS — R2681 Unsteadiness on feet: Secondary | ICD-10-CM | POA: Diagnosis not present

## 2019-05-23 DIAGNOSIS — L84 Corns and callosities: Secondary | ICD-10-CM | POA: Diagnosis not present

## 2019-05-23 DIAGNOSIS — M79675 Pain in left toe(s): Secondary | ICD-10-CM | POA: Diagnosis not present

## 2019-05-23 DIAGNOSIS — I739 Peripheral vascular disease, unspecified: Secondary | ICD-10-CM | POA: Diagnosis not present

## 2019-05-23 DIAGNOSIS — L602 Onychogryphosis: Secondary | ICD-10-CM | POA: Diagnosis not present

## 2019-05-25 DIAGNOSIS — R2681 Unsteadiness on feet: Secondary | ICD-10-CM | POA: Diagnosis not present

## 2019-05-25 DIAGNOSIS — R2689 Other abnormalities of gait and mobility: Secondary | ICD-10-CM | POA: Diagnosis not present

## 2019-05-25 DIAGNOSIS — R296 Repeated falls: Secondary | ICD-10-CM | POA: Diagnosis not present

## 2019-05-25 DIAGNOSIS — R41841 Cognitive communication deficit: Secondary | ICD-10-CM | POA: Diagnosis not present

## 2019-05-25 DIAGNOSIS — M6281 Muscle weakness (generalized): Secondary | ICD-10-CM | POA: Diagnosis not present

## 2019-05-27 DIAGNOSIS — F411 Generalized anxiety disorder: Secondary | ICD-10-CM | POA: Diagnosis not present

## 2019-05-27 DIAGNOSIS — F331 Major depressive disorder, recurrent, moderate: Secondary | ICD-10-CM | POA: Diagnosis not present

## 2019-05-28 ENCOUNTER — Other Ambulatory Visit: Payer: Self-pay

## 2019-05-29 ENCOUNTER — Other Ambulatory Visit
Admission: RE | Admit: 2019-05-29 | Discharge: 2019-05-29 | Disposition: A | Discharge status: Home / Self Care | Payer: Medicare Other | Source: Ambulatory Visit | Attending: Dentistry | Admitting: Dentistry

## 2019-05-29 DIAGNOSIS — Z01812 Encounter for preprocedural laboratory examination: Secondary | ICD-10-CM | POA: Diagnosis not present

## 2019-05-29 DIAGNOSIS — Z20828 Contact with and (suspected) exposure to other viral communicable diseases: Secondary | ICD-10-CM | POA: Insufficient documentation

## 2019-05-29 LAB — SARS CORONAVIRUS 2 (TAT 6-24 HRS): SARS Coronavirus 2: NEGATIVE

## 2019-06-02 ENCOUNTER — Other Ambulatory Visit: Payer: Self-pay

## 2019-06-02 ENCOUNTER — Ambulatory Visit: Payer: Medicare Other | Admitting: Anesthesiology

## 2019-06-02 ENCOUNTER — Ambulatory Visit
Admission: RE | Admit: 2019-06-02 | Discharge: 2019-06-02 | Disposition: A | Discharge status: Home / Self Care | Payer: Medicare Other | Attending: Otolaryngology | Admitting: Otolaryngology

## 2019-06-02 ENCOUNTER — Encounter
Admission: RE | Disposition: A | Discharge status: Home / Self Care | Payer: Self-pay | Source: Home / Self Care | Attending: Otolaryngology

## 2019-06-02 DIAGNOSIS — G2581 Restless legs syndrome: Secondary | ICD-10-CM | POA: Insufficient documentation

## 2019-06-02 DIAGNOSIS — Z792 Long term (current) use of antibiotics: Secondary | ICD-10-CM | POA: Insufficient documentation

## 2019-06-02 DIAGNOSIS — F0390 Unspecified dementia without behavioral disturbance: Secondary | ICD-10-CM | POA: Diagnosis not present

## 2019-06-02 DIAGNOSIS — K219 Gastro-esophageal reflux disease without esophagitis: Secondary | ICD-10-CM | POA: Diagnosis not present

## 2019-06-02 DIAGNOSIS — Z882 Allergy status to sulfonamides status: Secondary | ICD-10-CM | POA: Diagnosis not present

## 2019-06-02 DIAGNOSIS — M329 Systemic lupus erythematosus, unspecified: Secondary | ICD-10-CM | POA: Insufficient documentation

## 2019-06-02 DIAGNOSIS — Z888 Allergy status to other drugs, medicaments and biological substances status: Secondary | ICD-10-CM | POA: Diagnosis not present

## 2019-06-02 DIAGNOSIS — J449 Chronic obstructive pulmonary disease, unspecified: Secondary | ICD-10-CM | POA: Insufficient documentation

## 2019-06-02 DIAGNOSIS — Z881 Allergy status to other antibiotic agents status: Secondary | ICD-10-CM | POA: Insufficient documentation

## 2019-06-02 DIAGNOSIS — I10 Essential (primary) hypertension: Secondary | ICD-10-CM | POA: Diagnosis not present

## 2019-06-02 DIAGNOSIS — Z8673 Personal history of transient ischemic attack (TIA), and cerebral infarction without residual deficits: Secondary | ICD-10-CM | POA: Diagnosis not present

## 2019-06-02 DIAGNOSIS — F17210 Nicotine dependence, cigarettes, uncomplicated: Secondary | ICD-10-CM | POA: Insufficient documentation

## 2019-06-02 DIAGNOSIS — H900 Conductive hearing loss, bilateral: Secondary | ICD-10-CM | POA: Diagnosis not present

## 2019-06-02 DIAGNOSIS — R569 Unspecified convulsions: Secondary | ICD-10-CM | POA: Insufficient documentation

## 2019-06-02 DIAGNOSIS — Z880 Allergy status to penicillin: Secondary | ICD-10-CM | POA: Insufficient documentation

## 2019-06-02 DIAGNOSIS — M1990 Unspecified osteoarthritis, unspecified site: Secondary | ICD-10-CM | POA: Diagnosis not present

## 2019-06-02 DIAGNOSIS — F319 Bipolar disorder, unspecified: Secondary | ICD-10-CM | POA: Diagnosis not present

## 2019-06-02 DIAGNOSIS — Z79899 Other long term (current) drug therapy: Secondary | ICD-10-CM | POA: Insufficient documentation

## 2019-06-02 DIAGNOSIS — H6123 Impacted cerumen, bilateral: Secondary | ICD-10-CM | POA: Diagnosis not present

## 2019-06-02 HISTORY — DX: Impacted cerumen, unspecified ear: H61.20

## 2019-06-02 HISTORY — DX: Gastro-esophageal reflux disease without esophagitis: K21.9

## 2019-06-02 HISTORY — DX: Dizziness and giddiness: R42

## 2019-06-02 HISTORY — DX: Unspecified dementia, unspecified severity, without behavioral disturbance, psychotic disturbance, mood disturbance, and anxiety: F03.90

## 2019-06-02 HISTORY — DX: Other complications of anesthesia, initial encounter: T88.59XA

## 2019-06-02 HISTORY — DX: Personal history of urinary calculi: Z87.442

## 2019-06-02 HISTORY — PX: CERUMEN REMOVAL: SHX6571

## 2019-06-02 HISTORY — DX: Other symptoms and signs involving the musculoskeletal system: R29.898

## 2019-06-02 HISTORY — DX: Allergic rhinitis, unspecified: J30.9

## 2019-06-02 HISTORY — DX: Chronic obstructive pulmonary disease, unspecified: J44.9

## 2019-06-02 HISTORY — DX: Depression, unspecified: F32.A

## 2019-06-02 HISTORY — DX: Bipolar disorder, unspecified: F31.9

## 2019-06-02 HISTORY — DX: Transient cerebral ischemic attack, unspecified: G45.9

## 2019-06-02 HISTORY — DX: Essential (primary) hypertension: I10

## 2019-06-02 SURGERY — EXAM UNDER ANESTHESIA
Anesthesia: General | Site: Ear | Laterality: Left

## 2019-06-02 MED ORDER — DEXMEDETOMIDINE HCL 200 MCG/2ML IV SOLN
INTRAVENOUS | Status: DC | PRN
  Administered 2019-06-02: 10 ug via INTRAVENOUS

## 2019-06-02 MED ORDER — ONDANSETRON HCL 4 MG/2ML IJ SOLN: 4.0000 mg | Freq: Once | INTRAMUSCULAR | Status: DC | PRN

## 2019-06-02 MED ORDER — OXYCODONE HCL 5 MG/5ML PO SOLN: 5.0000 mg | Freq: Once | ORAL | Status: DC | PRN

## 2019-06-02 MED ORDER — ACETAMINOPHEN 10 MG/ML IV SOLN: 1000.0000 mg | Freq: Once | INTRAVENOUS | Status: DC | PRN

## 2019-06-02 MED ORDER — GLYCOPYRROLATE 0.2 MG/ML IJ SOLN
INTRAMUSCULAR | Status: DC | PRN
  Administered 2019-06-02: 0.1 mg via INTRAVENOUS

## 2019-06-02 MED ORDER — LIDOCAINE HCL (CARDIAC) PF 100 MG/5ML IV SOSY
PREFILLED_SYRINGE | INTRAVENOUS | Status: DC | PRN
  Administered 2019-06-02: 40 mg via INTRATRACHEAL

## 2019-06-02 MED ORDER — MIDAZOLAM HCL 5 MG/5ML IJ SOLN
INTRAMUSCULAR | Status: DC | PRN
  Administered 2019-06-02: 2 mg via INTRAVENOUS

## 2019-06-02 MED ORDER — LACTATED RINGERS IV SOLN: INTRAVENOUS | Status: DC

## 2019-06-02 MED ORDER — OXYCODONE HCL 5 MG PO TABS: 5.0000 mg | ORAL_TABLET | Freq: Once | ORAL | Status: DC | PRN

## 2019-06-02 MED ORDER — ACETAMINOPHEN 325 MG PO TABS
650.0000 mg | ORAL_TABLET | Freq: Once | ORAL | Status: AC
  Administered 2019-06-02: 650 mg via ORAL

## 2019-06-02 MED ORDER — FENTANYL CITRATE (PF) 100 MCG/2ML IJ SOLN: 25.0000 ug | INTRAMUSCULAR | Status: DC | PRN

## 2019-06-02 MED ORDER — LACTATED RINGERS IV SOLN
10.0000 mL/h | INTRAVENOUS | Status: DC
  Administered 2019-06-02: 10 mL/h via INTRAVENOUS

## 2019-06-02 SURGICAL SUPPLY — 6 items
CANISTER SUCT 1200ML W/VALVE (MISCELLANEOUS) ×4 IMPLANT
GLOVE BIO SURGEON STRL SZ7.5 (GLOVE) ×4 IMPLANT
KIT TURNOVER KIT A (KITS) ×4 IMPLANT
TOWEL OR 17X26 4PK STRL BLUE (TOWEL DISPOSABLE) ×4 IMPLANT
TUBING CONN 6MMX3.1M (TUBING) ×2
TUBING SUCTION CONN 0.25 STRL (TUBING) ×2 IMPLANT

## 2019-06-02 NOTE — Anesthesia Postprocedure Evaluation (Signed)
Anesthesia Post Note  Patient: Paula Frank  Procedure(s) Performed: EXAM UNDER ANESTHESIA (Bilateral Ear) CERUMEN REMOVAL (Left Ear)     Patient location during evaluation: PACU Anesthesia Type: General Level of consciousness: awake and alert, oriented and patient cooperative Pain management: pain level controlled Vital Signs Assessment: post-procedure vital signs reviewed and stable Respiratory status: spontaneous breathing, nonlabored ventilation and respiratory function stable Cardiovascular status: blood pressure returned to baseline and stable Postop Assessment: adequate PO intake Anesthetic complications: no    Darrin Nipper

## 2019-06-02 NOTE — Transfer of Care (Signed)
Immediate Anesthesia Transfer of Care Note  Patient: Paula Frank  Procedure(s) Performed: EXAM UNDER ANESTHESIA (Bilateral Ear) CERUMEN REMOVAL (Left Ear)  Patient Location: PACU  Anesthesia Type: General  Level of Consciousness: awake, alert  and patient cooperative  Airway and Oxygen Therapy: Patient Spontanous Breathing and Patient connected to supplemental oxygen  Post-op Assessment: Post-op Vital signs reviewed, Patient's Cardiovascular Status Stable, Respiratory Function Stable, Patent Airway and No signs of Nausea or vomiting  Post-op Vital Signs: Reviewed and stable  Complications: No apparent anesthesia complications

## 2019-06-02 NOTE — Anesthesia Preprocedure Evaluation (Signed)
Anesthesia Evaluation  Patient identified by MRN, date of birth, ID band Patient awake    Reviewed: Allergy & Precautions, NPO status , Patient's Chart, lab work & pertinent test results  History of Anesthesia Complications Negative for: history of anesthetic complications  Airway Mallampati: I   Neck ROM: Full    Dental  (+) Edentulous Upper, Edentulous Lower   Pulmonary COPD, Current Smoker (1 ppd)Patient did not abstain from smoking.,    Pulmonary exam normal breath sounds clear to auscultation       Cardiovascular hypertension, Normal cardiovascular exam Rhythm:Regular Rate:Normal     Neuro/Psych Seizures -,  PSYCHIATRIC DISORDERS Anxiety Depression Bipolar Disorder Dementia EtOH abuse; pt reports drinking 1-2 glasses of wine per day; last intake 4 days ago; chronic pain TIA   GI/Hepatic GERD  ,  Endo/Other  negative endocrine ROS  Renal/GU Renal disease (nephrolithiasis)     Musculoskeletal  (+) Arthritis ,   Abdominal   Peds  Hematology Lupus    Anesthesia Other Findings   Reproductive/Obstetrics                             Anesthesia Physical Anesthesia Plan  ASA: III  Anesthesia Plan: General   Post-op Pain Management:    Induction: Intravenous  PONV Risk Score and Plan: 2 and Propofol infusion and TIVA  Airway Management Planned: Natural Airway  Additional Equipment:   Intra-op Plan:   Post-operative Plan:   Informed Consent: I have reviewed the patients History and Physical, chart, labs and discussed the procedure including the risks, benefits and alternatives for the proposed anesthesia with the patient or authorized representative who has indicated his/her understanding and acceptance.       Plan Discussed with: CRNA  Anesthesia Plan Comments:         Anesthesia Quick Evaluation

## 2019-06-02 NOTE — Anesthesia Procedure Notes (Signed)
Procedure Name: General with mask airway Performed by: Penelope Fittro, CRNA Pre-anesthesia Checklist: Patient identified, Patient being monitored, Emergency Drugs available, Timeout performed and Suction available Patient Re-evaluated:Patient Re-evaluated prior to induction Oxygen Delivery Method: Circle system utilized Preoxygenation: Pre-oxygenation with 100% oxygen Induction Type: Combination inhalational/ intravenous induction Ventilation: Mask ventilation without difficulty Dental Injury: Teeth and Oropharynx as per pre-operative assessment        

## 2019-06-02 NOTE — Discharge Instructions (Signed)

## 2019-06-02 NOTE — H&P (Signed)
History and physical reviewed and will be scanned in later. No change in medical status reported by the patient or family, appears stable for surgery. All questions regarding the procedure answered, and patient (or family if a child) expressed understanding of the procedure. ? ?Paula Frank S Paula Frank ?@TODAY@ ?

## 2019-06-02 NOTE — Op Note (Signed)
06/02/2019  9:55 AM    Lamar Blinks  LE:[id number]   Pre-Op Diagnosis:  Cerumen impaction with hearing loss  Post-op Diagnosis: SAME  Procedure: Bilateral removal of impacted cerumen  Surgeon:  Riley Nearing., MD  Anesthesia:  General anesthesia with masked ventilation  EBL:  Minimal  Complications:  None  Findings: small amount of cerumen AD with clear middle ear, intact TM. Impacted cerumen AS with clear middle ear, intact TM.   Procedure: The patient was taken to the Operating Room and placed in the supine position.  After induction of general anesthesia with mask ventilation, the right ear was evaluated under the operating microscope and the canal cleaned of excess cerumen with a curette. The findings were as described above.    Attention was then turned to the left ear. The same procedure was then performed on this side, using a suction and irrigation to remove impacted cerumen.  The patient was then returned to the anesthesiologist for awakening, and was taken to the Recovery Room in stable condition.  Cultures:  None.  Disposition:   PACU then discharge home  Plan: follow up as needed. If hearing concerns persist can return to office for audiogram  Riley Nearing 06/02/2019 9:55 AM

## 2019-06-03 ENCOUNTER — Encounter: Payer: Self-pay | Admitting: Otolaryngology

## 2019-06-20 ENCOUNTER — Emergency Department
Admission: EM | Admit: 2019-06-20 | Discharge: 2019-06-20 | Disposition: A | Discharge status: Home / Self Care | Payer: Medicare Other | Attending: Emergency Medicine | Admitting: Emergency Medicine

## 2019-06-20 ENCOUNTER — Emergency Department: Payer: Medicare Other

## 2019-06-20 ENCOUNTER — Other Ambulatory Visit: Payer: Self-pay

## 2019-06-20 ENCOUNTER — Encounter: Payer: Self-pay | Admitting: Emergency Medicine

## 2019-06-20 DIAGNOSIS — M25512 Pain in left shoulder: Secondary | ICD-10-CM | POA: Diagnosis not present

## 2019-06-20 DIAGNOSIS — Z79899 Other long term (current) drug therapy: Secondary | ICD-10-CM | POA: Insufficient documentation

## 2019-06-20 DIAGNOSIS — J449 Chronic obstructive pulmonary disease, unspecified: Secondary | ICD-10-CM | POA: Insufficient documentation

## 2019-06-20 DIAGNOSIS — R110 Nausea: Secondary | ICD-10-CM | POA: Insufficient documentation

## 2019-06-20 DIAGNOSIS — M25519 Pain in unspecified shoulder: Secondary | ICD-10-CM

## 2019-06-20 DIAGNOSIS — I10 Essential (primary) hypertension: Secondary | ICD-10-CM | POA: Insufficient documentation

## 2019-06-20 DIAGNOSIS — F17210 Nicotine dependence, cigarettes, uncomplicated: Secondary | ICD-10-CM | POA: Insufficient documentation

## 2019-06-20 DIAGNOSIS — M25511 Pain in right shoulder: Secondary | ICD-10-CM | POA: Diagnosis present

## 2019-06-20 LAB — URINALYSIS, COMPLETE (UACMP) WITH MICROSCOPIC
Bacteria, UA: NONE SEEN
Bilirubin Urine: NEGATIVE
Glucose, UA: NEGATIVE mg/dL
Hgb urine dipstick: NEGATIVE
Ketones, ur: NEGATIVE mg/dL
Leukocytes,Ua: NEGATIVE
Nitrite: NEGATIVE
Protein, ur: NEGATIVE mg/dL
Specific Gravity, Urine: 1.005 (ref 1.005–1.030)
Squamous Epithelial / HPF: NONE SEEN (ref 0–5)
pH: 6 (ref 5.0–8.0)

## 2019-06-20 LAB — CBC
HCT: 42.4 % (ref 36.0–46.0)
Hemoglobin: 13.5 g/dL (ref 12.0–15.0)
MCH: 31.1 pg (ref 26.0–34.0)
MCHC: 31.8 g/dL (ref 30.0–36.0)
MCV: 97.7 fL (ref 80.0–100.0)
Platelets: 339 10*3/uL (ref 150–400)
RBC: 4.34 MIL/uL (ref 3.87–5.11)
RDW: 16.6 % — ABNORMAL HIGH (ref 11.5–15.5)
WBC: 5.6 10*3/uL (ref 4.0–10.5)
nRBC: 0 % (ref 0.0–0.2)

## 2019-06-20 LAB — TROPONIN I (HIGH SENSITIVITY): Troponin I (High Sensitivity): 15 ng/L (ref <18)

## 2019-06-20 LAB — COMPREHENSIVE METABOLIC PANEL
ALT: 18 U/L (ref 0–44)
AST: 24 U/L (ref 15–41)
Albumin: 3.8 g/dL (ref 3.5–5.0)
Alkaline Phosphatase: 98 U/L (ref 38–126)
Anion gap: 11 (ref 5–15)
BUN: 21 mg/dL (ref 8–23)
CO2: 25 mmol/L (ref 22–32)
Calcium: 9.1 mg/dL (ref 8.9–10.3)
Chloride: 105 mmol/L (ref 98–111)
Creatinine, Ser: 0.9 mg/dL (ref 0.44–1.00)
GFR calc Af Amer: >60 mL/min (ref >60)
GFR calc non Af Amer: >60 mL/min (ref >60)
Glucose, Bld: 102 mg/dL — ABNORMAL HIGH (ref 70–99)
Potassium: 4.1 mmol/L (ref 3.5–5.1)
Sodium: 141 mmol/L (ref 135–145)
Total Bilirubin: 0.3 mg/dL (ref 0.3–1.2)
Total Protein: 7.5 g/dL (ref 6.5–8.1)

## 2019-06-20 LAB — LIPASE, BLOOD: Lipase: 25 U/L (ref 11–51)

## 2019-06-20 MED ORDER — ONDANSETRON 4 MG PO TBDP
4.0000 mg | ORAL_TABLET | Freq: Once | ORAL | Status: AC
  Administered 2019-06-20: 4 mg via ORAL
  Filled 2019-06-20: qty 1

## 2019-06-20 MED ORDER — SODIUM CHLORIDE 0.9% FLUSH: 3.0000 mL | Freq: Once | INTRAVENOUS | Status: DC

## 2019-06-20 MED ORDER — ONDANSETRON 4 MG PO TBDP
4.0000 mg | ORAL_TABLET | Freq: Once | ORAL | Status: AC | PRN
  Administered 2019-06-20: 4 mg via ORAL
  Filled 2019-06-20: qty 1

## 2019-06-20 MED ORDER — SODIUM CHLORIDE 0.9 % IV BOLUS
1000.0000 mL | Freq: Once | INTRAVENOUS | Status: AC
  Administered 2019-06-20: 1000 mL via INTRAVENOUS

## 2019-06-20 MED ORDER — IBUPROFEN 400 MG PO TABS
400.0000 mg | ORAL_TABLET | Freq: Once | ORAL | Status: AC | PRN
  Administered 2019-06-20: 400 mg via ORAL
  Filled 2019-06-20: qty 1

## 2019-06-20 MED ORDER — ONDANSETRON HCL 4 MG/2ML IJ SOLN
4.0000 mg | Freq: Once | INTRAMUSCULAR | Status: AC
  Administered 2019-06-20: 4 mg via INTRAVENOUS
  Filled 2019-06-20: qty 2

## 2019-06-20 MED ORDER — TRAMADOL HCL 50 MG PO TABS: 50.0000 mg | ORAL_TABLET | Freq: Four times a day (QID) | ORAL | 0 refills | Status: AC | PRN

## 2019-06-20 MED ORDER — OXYCODONE-ACETAMINOPHEN 5-325 MG PO TABS
2.0000 | ORAL_TABLET | Freq: Once | ORAL | Status: AC
  Administered 2019-06-20: 14:00:00 2 via ORAL
  Filled 2019-06-20: qty 2

## 2019-06-20 MED ORDER — MORPHINE SULFATE (PF) 4 MG/ML IV SOLN
4.0000 mg | Freq: Once | INTRAVENOUS | Status: AC
  Administered 2019-06-20: 4 mg via INTRAVENOUS
  Filled 2019-06-20: qty 1

## 2019-06-20 NOTE — ED Provider Notes (Signed)
[facility] Emergency Department Provider Note  Time seen: 11:03 AM  I have reviewed the triage vital signs and the nursing notes.   HISTORY  Chief Complaint Nausea and Shoulder Pain   HPI Paula Frank is a 78 y.o. female with a past medical history anxiety, bipolar, COPD, dementia, gastric reflux, presents to the emergency department for bilateral shoulder pain, right greater than left.  According to the patient for years she has been experiencing pain in her shoulders, states she has been seen by many doctors and they can never tell her why it hurts per patient.  Patient states she tried Motrin today without relief so she came to the emergency department.  Patient does state some nausea but denies any vomiting.  On review of systems denies any chest pain or back pain.  Denies any abdominal pain nausea vomiting or diarrhea.   Past Medical History:  Diagnosis Date  . Allergic rhinitis   . Anxiety   . Bipolar disorder ([name])   . Cerumen impaction   . Chronic pain   . Complaints of leg weakness   . Complication of anesthesia    wakes up during procedure  . COPD (chronic obstructive pulmonary disease) ([name])   . Dementia ([name])   . Depression   . Discoid lupus   . Diverticulitis   . Dizziness   . Gastritis   . GERD (gastroesophageal reflux disease)   . History of kidney stones   . Hypertension    goes up and down, not on meds  . Lupus (systemic lupus erythematosus) ([name])   . Restless leg   . Seizures ([name])   . TIA (transient ischemic attack)     Patient Active Problem List   Diagnosis Date Noted  . TIA (transient ischemic attack) 11/16/2017  . Substance induced mood disorder ([name]) 11/05/2017  . Constipation, chronic 07/11/2017  . COPD (chronic obstructive pulmonary disease) ([name]) 11/21/2016  . Appetite loss 10/09/2016  . Dermatitis 07/23/2016  . Nausea & vomiting 04/09/2016  . Herpes labialis 02/23/2016  . Abdominal swelling, generalized  01/26/2016  . Bilateral arm weakness 10/12/2015  . Difficulty walking 10/12/2015  . Neck pain 10/12/2015  . Rash 10/12/2015  . Spell of altered consciousness 10/12/2015  . 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 Bipolar I disorder, most recent episode depressed, severe without psychotic features ([name]) 02/16/2015  . Alcohol abuse 02/16/2015  . Alzheimer's dementia ([name]) 02/16/2015  . 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 Current tobacco use 02/19/2014  . Systemic lupus erythematosus ([name]) 01/14/2014  . Seizure ([name]) 10/30/2013  . Disordered sleep 10/30/2013  . Adynamia 10/30/2013  . Essential hypertension 05/19/2012  . Right shoulder injury 05/16/2012  . Fatigue 11/20/2011    Past Surgical History:  Procedure Laterality Date  . ABDOMINAL HYSTERECTOMY    . APPENDECTOMY    . CERUMEN REMOVAL Left 06/02/2019   Procedure: CERUMEN REMOVAL;  Surgeon: Clyde Canterbury, MD;  Location: [place];  Service: ENT;  Laterality: Left;  . COLONOSCOPY WITH PROPOFOL N/A 12/03/2018   Procedure: COLONOSCOPY WITH PROPOFOL;  Surgeon: Toledo, Benay Pike, MD;  Location: [facility] ENDOSCOPY;  Service: Gastroenterology;  Laterality: N/A;  . ESOPHAGOGASTRODUODENOSCOPY (EGD) WITH PROPOFOL N/A 12/03/2018   Procedure: ESOPHAGOGASTRODUODENOSCOPY (EGD) WITH PROPOFOL;  Surgeon: Toledo, Benay Pike, MD;  Location: [facility] ENDOSCOPY;  Service: Gastroenterology;  Laterality: N/A;  . KNEE ARTHROSCOPY W/ MENISCECTOMY    . LAPAROSCOPY    .  LITHOTRIPSY    . ROTATOR CUFF REPAIR    . ROTATOR CUFF REPAIR Bilateral    x2  . TUBAL LIGATION      Prior to Admission medications   Medication Sig Start Date End Date Taking? Authorizing Provider  albuterol (VENTOLIN HFA) 108 (90 Base) MCG/ACT inhaler Inhale 2 puffs into the lungs every 6 (six) hours as needed for wheezing or shortness of breath.    [provider]  aspirin EC 81 MG EC tablet Take 1 tablet (81 mg total) by mouth daily. Patient not taking: Reported on 12/03/2018 11/19/17   Max Sane, MD  azithromycin (ZITHROMAX) 250 MG tablet Take by mouth daily.    [provider]  buPROPion (WELLBUTRIN) 75 MG tablet Take 75 mg by mouth daily.    [provider]  carbamide peroxide (DEBROX) 6.5 % OTIC solution 5 drops 2 (two) times daily.    [provider]  docusate sodium (COLACE) 100 MG capsule Take 100 mg by mouth 2 (two) times daily.    [provider]  doxepin (SINEQUAN) 25 MG capsule Take 25 mg by mouth at bedtime.    [provider]  doxycycline (VIBRA-TABS) 100 MG tablet Take 1 tablet (100 mg total) by mouth every 12 (twelve) hours. Patient not taking: Reported on 12/03/2018 11/18/17   Max Sane, MD  fluconazole (DIFLUCAN) 150 MG tablet Take 150 mg by mouth daily.    [provider]  fluticasone (FLONASE) 50 MCG/ACT nasal spray Place 2 sprays into both nostrils daily. Use for 4-6 weeks then stop and use seasonally or as needed. 10/09/16   Karamalegos, Devonne Doughty, DO  gabapentin (NEURONTIN) 100 MG capsule Take 100 mg by mouth 3 (three) times daily.    [provider]  gabapentin (NEURONTIN) 800 MG tablet Take 800 mg by mouth 2 (two) times daily.    [provider]  ibuprofen (ADVIL) 400 MG tablet Take 400 mg by mouth every 6 (six) hours as needed.    [provider]  lactulose (CEPHULAC) 20 g packet Take 1 packet (20 g total) by mouth 3 (three) times daily. 04/11/18   Merlyn Lot, MD  lamoTRIgine (LAMICTAL) 25 MG tablet Take 25 mg by mouth daily.    [provider]  loratadine (CLARITIN) 10 MG tablet Take 1 tablet (10 mg total) by mouth daily. Patient taking differently: Take 10 mg by mouth daily. q6pm 07/23/16   Arlis Porta., MD  Multiple Vitamin (MULTIVITAMIN) capsule Take 1 capsule by mouth daily.    [provider]   naltrexone (DEPADE) 50 MG tablet Take by mouth daily.    [provider]  omeprazole (PRILOSEC) 20 MG capsule Take 20 mg by mouth daily.    [provider]  ondansetron (ZOFRAN) 4 MG tablet Take 1 tablet (4 mg total) by mouth every 8 (eight) hours as needed for nausea or vomiting. 07/31/17   Karamalegos, Devonne Doughty, DO  QUEtiapine (SEROQUEL) 400 MG tablet Take 400 mg by mouth at bedtime.     [provider]  rOPINIRole (REQUIP) 1 MG tablet TAKE 1 TABLET EVERY MORNING AND 2 TABLETS EVERY NIGHT 11/24/17   Karamalegos, Devonne Doughty, DO  triamcinolone cream (KENALOG) 0.5 % Apply 1 application topically 3 (three) times daily.    [provider]  vitamin B-12 (CYANOCOBALAMIN) 1000 MCG tablet Take 1,000 mcg by mouth daily.    [provider]    Allergies  Allergen Reactions  . Iodinated Diagnostic Agents  Other reaction(s): Unknown Other reaction(s): Other (See Comments) Other reaction(s): Unknown  . Sulfa Antibiotics Rash    Other reaction(s): Unknown Other reaction(s): Unknown   . Amoxicillin Other (See Comments)  . Ioxaglate     Other reaction(s): Unknown Other reaction(s): Unknown Other reaction(s): Unknown   . Levaquin [Levofloxacin In D5w]   . Moxifloxacin Other (See Comments)    Other reaction(s): Unknown Other reaction(s): Unknown  . Moxifloxacin Hcl In Nacl     Other reaction(s): Unknown  . Penicillin G     Other reaction(s): Other (See Comments) Other reaction(s): Unknown  . Erythromycin Rash    Canker sore  . Levofloxacin Rash    Other reaction(s): Unknown Other reaction(s): Unknown  . Sulfacetamide Sodium Rash    Other reaction(s): Unknown    Family History  Problem Relation Age of Onset  . Cancer Mother   . Esophageal cancer Father   . Cancer Son   . Breast cancer Sister     Social History Social History   Tobacco Use  . Smoking status: Current Every Day Smoker    Packs/day: 1.00    Years: 60.00    Pack  years: 60.00    Types: Cigarettes  . Smokeless tobacco: Never Used  Substance Use Topics  . Alcohol use: Yes    Alcohol/week: 23.0 - 30.0 standard drinks    Types: 2 Glasses of wine, 14 Shots of liquor, 7 - 14 Standard drinks or equivalent per week  . Drug use: No    Review of Systems Constitutional: Negative for fever. Cardiovascular: Negative for chest pain. Respiratory: Negative for shortness of breath. Gastrointestinal: Negative for abdominal pain, vomiting and diarrhea. Musculoskeletal: Bilateral shoulder pain. Skin: Negative for skin complaints  Neurological: Negative for headache All other ROS negative  ____________________________________________   PHYSICAL EXAM:  VITAL SIGNS: ED Triage Vitals  Enc Vitals Group     BP 06/20/19 0726 (!) 155/93     Pulse Rate 06/20/19 0726 98     Resp 06/20/19 0726 16     Temp 06/20/19 0726 98.4 F (36.9 C)     Temp Source 06/20/19 0726 Oral     SpO2 06/20/19 0726 93 %     Weight 06/20/19 0727 154 lb (69.9 kg)     Height 06/20/19 0727 5\' 6"  (1.676 m)     Head Circumference --      Peak Flow --      Pain Score 06/20/19 0727 10     Pain Loc --      Pain Edu? --      Excl. in [place]? --    Constitutional: Awake and alert, no acute distress. Eyes: Normal exam ENT      Head: Normocephalic and atraumatic.      Mouth/Throat: Mucous membranes are moist. Cardiovascular: Normal rate, regular rhythm. Respiratory: Normal respiratory effort without tachypnea nor retractions. Breath sounds are clear  Gastrointestinal: Soft and nontender. No distention.   Musculoskeletal: Patient states bilateral shoulder pain however nontender range of motion in bilateral shoulders.  Neurovascular intact distally bilaterally.  Good strength bilaterally.  Nontender to palpation bilaterally. Neurologic:  Normal speech and language. No gross focal neurologic deficits  Skin:  Skin is warm, dry and intact.  Psychiatric: Mood and affect are  normal.  ____________________________________________    EKG  EKG viewed and interpreted by myself shows sinus rhythm at 98 bpm with a slightly widened QRS, normal axis, normal intervals nonspecific ST changes  ____________________________________________    RADIOLOGY  CT  negative for acute finding.  Bilateral 5 mm lymph nodes I discussed this with the patient with recommended repeat CT in 12 months.  ____________________________________________   INITIAL IMPRESSION / ASSESSMENT AND PLAN / ED COURSE  Pertinent labs & imaging results that were available during my care of the patient were reviewed by me and considered in my medical decision making (see chart for details).   Patient presents to the emergency department for bilateral shoulder pain right greater than left.  Per patient she has been dealing with this for a very long time but no doctor can tell her why it hurts.  Denies any known exacerbation.  Denies taking any pain medication chronically.  Denies any chest pain or back pain.  Patient per report does have mild dementia.  We will obtain fairly broad labs including CBC CMP lipase and troponin.  We will treat pain nausea, IV hydrate while awaiting results.  Given the bilateral findings we will also obtain a CT chest to rule out intrathoracic abnormality, patient is allergic to IV dye.  No pleuritic pain to suggest PE, no chest or back pain to suggest dissection.  Patient's work-up is largely nonrevealing.  Labs including urinalysis are largely within normal limits.  CT scan shows no acute findings.  Overall the patient appears well reassuring vitals reassuring work-up.  We will discharge patient with a short course of pain medication have the patient follow-up with her doctor.  Patient agreeable to plan of care.  JOANI ONSTAD was evaluated in Emergency Department on 06/20/2019 for the symptoms described in the history of present illness. She was evaluated in the context of  the global COVID-19 pandemic, which necessitated consideration that the patient might be at risk for infection with the SARS-CoV-2 virus that causes COVID-19. Institutional protocols and algorithms that pertain to the evaluation of patients at risk for COVID-19 are in a state of rapid change based on information released by regulatory bodies including the CDC and federal and state organizations. These policies and algorithms were followed during the patient's care in the ED.  ____________________________________________   FINAL CLINICAL IMPRESSION(S) / ED DIAGNOSES  Shoulder pain   Harvest Dark, MD 06/20/19 1409

## 2019-06-20 NOTE — ED Notes (Signed)
Pt to front desk to ask about wait time. Pt informed she is longest wait at this time.

## 2019-06-20 NOTE — ED Notes (Addendum)
CT in to see pt- per CT pt states that she I snot having any pain- CT informed MD- Pt transported to CT

## 2019-06-20 NOTE — Discharge Instructions (Addendum)
Please follow-up with your doctor on Monday for recheck/reevaluation.  You may take your pain medication but only as prescribed.  Return to the emergency department for any worsening pain, chest pain, trouble breathing, or any other symptom personally concerning to yourself.

## 2019-06-20 NOTE — ED Notes (Signed)
CT aware of pt ready

## 2019-06-20 NOTE — ED Triage Notes (Signed)
Pt to ED via POV c/o nausea and severe bilateral shoulder pain. Pt states that the pain is a burning pain. Pt states that the pain started "this afternoon". Pt c/o dizziness. Pt stating "please, please, something for pain". Pt denies vomiting. VSS at this time.

## 2019-06-20 NOTE — ED Notes (Signed)
Pt in lobby yelling out for RN. RN spoke with patient. Reassured her that we have taken an EKG and blood work, and that she has been given medication for pain and nausea and that we will get her to a room as soon as we can. Pt stating that the medication for pain is not helping. Pt informed that she needs to allow time for the medication to work. Pt states that "you gave me motrin, that ain't gonna do shit". Pt informed that she needed to allow medication time to work and reminded that yelling in the lobby is not appropriate and will not get her to a room faster.

## 2019-06-20 NOTE — ED Notes (Signed)
Patient reports she does not wear O2 all the time and did not ask husband to bring any oxygen for her to have on ride home

## 2019-06-20 NOTE — ED Notes (Signed)
Pt states she is having trouble breathing- pt O2 stable at 92%- pt asking for ice chips, informed her that she will have to wait until CT results are in

## 2019-06-20 NOTE — ED Notes (Signed)
Pt to front desk to ask how long it will be before she is seen. Pt informed that I cannot give her time but that there are 4 people who have been here longer than her. Pt states that she is still having pain and the medication has not helped. Pt informed that we will get her back as soon as we can. Pt given warm blanket. Pt able to ambulate back to wheelchair without difficulty or distress.

## 2019-06-20 NOTE — ED Notes (Signed)
Pt assisted to the restroom. Pt required stand by assistance.

## 2019-06-30 ENCOUNTER — Other Ambulatory Visit: Payer: Self-pay | Admitting: Physician Assistant

## 2019-06-30 DIAGNOSIS — IMO0001 Reserved for inherently not codable concepts without codable children: Secondary | ICD-10-CM

## 2019-06-30 DIAGNOSIS — H9042 Sensorineural hearing loss, unilateral, left ear, with unrestricted hearing on the contralateral side: Secondary | ICD-10-CM

## 2019-07-07 ENCOUNTER — Ambulatory Visit: Payer: Medicare Other

## 2019-07-17 ENCOUNTER — Other Ambulatory Visit: Payer: Self-pay

## 2019-07-17 ENCOUNTER — Ambulatory Visit
Admission: RE | Admit: 2019-07-17 | Discharge: 2019-07-17 | Disposition: A | Discharge status: Home / Self Care | Payer: Medicare Other | Source: Ambulatory Visit | Attending: Physician Assistant | Admitting: Physician Assistant

## 2019-07-17 DIAGNOSIS — IMO0001 Reserved for inherently not codable concepts without codable children: Secondary | ICD-10-CM

## 2019-07-17 DIAGNOSIS — H9042 Sensorineural hearing loss, unilateral, left ear, with unrestricted hearing on the contralateral side: Secondary | ICD-10-CM | POA: Diagnosis present

## 2019-07-17 MED ORDER — GADOBUTROL 1 MMOL/ML IV SOLN
7.0000 mL | Freq: Once | INTRAVENOUS | Status: AC | PRN
  Administered 2019-07-17: 7 mL via INTRAVENOUS

## 2019-07-29 ENCOUNTER — Ambulatory Visit: Payer: Medicare Other | Admitting: Gastroenterology

## 2019-09-16 ENCOUNTER — Ambulatory Visit: Payer: Medicare Other | Admitting: Gastroenterology

## 2019-11-17 ENCOUNTER — Other Ambulatory Visit: Admission: RE | Admit: 2019-11-17 | Payer: Medicare Other | Source: Ambulatory Visit

## 2019-11-19 ENCOUNTER — Ambulatory Visit: Admission: RE | Admit: 2019-11-19 | Payer: Medicare Other | Source: Home / Self Care | Admitting: Internal Medicine

## 2019-11-19 ENCOUNTER — Encounter: Admission: RE | Payer: Self-pay | Source: Home / Self Care

## 2019-11-19 SURGERY — COLONOSCOPY WITH PROPOFOL
Anesthesia: General

## 2019-11-24 DEATH — deceased
